# Patient Record
Sex: Male | Born: 1980
Health system: Southern US, Community
[De-identification: ages and names within clinical notes are randomized; demographics above are authoritative.]

## PROBLEM LIST (undated history)

## (undated) DIAGNOSIS — N200 Calculus of kidney: Secondary | ICD-10-CM

## (undated) DIAGNOSIS — T7840XA Allergy, unspecified, initial encounter: Secondary | ICD-10-CM

## (undated) DIAGNOSIS — M19012 Primary osteoarthritis, left shoulder: Secondary | ICD-10-CM

## (undated) DIAGNOSIS — N433 Hydrocele, unspecified: Secondary | ICD-10-CM

## (undated) DIAGNOSIS — J329 Chronic sinusitis, unspecified: Secondary | ICD-10-CM

## (undated) HISTORY — DX: Allergy, unspecified, initial encounter: T78.40XA

## (undated) HISTORY — PX: LITHOTRIPSY: SUR834

## (undated) HISTORY — PX: EYE SURGERY: SHX253

## (undated) HISTORY — DX: Hydrocele, unspecified: N43.3

## (undated) HISTORY — PX: NASAL SINUS SURGERY: SHX719

---

## 1999-01-31 DIAGNOSIS — N433 Hydrocele, unspecified: Secondary | ICD-10-CM

## 1999-01-31 HISTORY — DX: Hydrocele, unspecified: N43.3

## 2003-04-01 ENCOUNTER — Emergency Department (HOSPITAL_COMMUNITY): Admission: EM | Admit: 2003-04-01 | Discharge: 2003-04-01 | Payer: Self-pay | Admitting: Emergency Medicine

## 2005-07-31 ENCOUNTER — Emergency Department (HOSPITAL_COMMUNITY): Admission: EM | Admit: 2005-07-31 | Discharge: 2005-07-31 | Payer: Self-pay | Admitting: *Deleted

## 2005-08-04 ENCOUNTER — Ambulatory Visit (HOSPITAL_COMMUNITY): Admission: RE | Admit: 2005-08-04 | Discharge: 2005-08-04 | Payer: Self-pay | Admitting: Urology

## 2005-08-15 ENCOUNTER — Ambulatory Visit (HOSPITAL_COMMUNITY): Admission: RE | Admit: 2005-08-15 | Discharge: 2005-08-15 | Payer: Self-pay | Admitting: Urology

## 2005-11-07 ENCOUNTER — Emergency Department (HOSPITAL_COMMUNITY): Admission: EM | Admit: 2005-11-07 | Discharge: 2005-11-07 | Payer: Self-pay | Admitting: Emergency Medicine

## 2006-08-04 ENCOUNTER — Emergency Department (HOSPITAL_COMMUNITY): Admission: EM | Admit: 2006-08-04 | Discharge: 2006-08-05 | Payer: Self-pay | Admitting: Emergency Medicine

## 2010-04-06 ENCOUNTER — Emergency Department (HOSPITAL_COMMUNITY): Admission: EM | Admit: 2010-04-06 | Discharge: 2010-04-06 | Payer: Self-pay | Admitting: Family Medicine

## 2011-10-02 ENCOUNTER — Encounter (HOSPITAL_COMMUNITY): Payer: Self-pay | Admitting: Emergency Medicine

## 2011-10-02 ENCOUNTER — Emergency Department (HOSPITAL_COMMUNITY)
Admission: EM | Admit: 2011-10-02 | Discharge: 2011-10-02 | Disposition: A | Discharge status: Home / Self Care | Payer: BC Managed Care – PPO | Attending: Emergency Medicine | Admitting: Emergency Medicine

## 2011-10-02 DIAGNOSIS — R070 Pain in throat: Secondary | ICD-10-CM | POA: Insufficient documentation

## 2011-10-02 DIAGNOSIS — R05 Cough: Secondary | ICD-10-CM | POA: Insufficient documentation

## 2011-10-02 DIAGNOSIS — R059 Cough, unspecified: Secondary | ICD-10-CM

## 2011-10-02 DIAGNOSIS — J3489 Other specified disorders of nose and nasal sinuses: Secondary | ICD-10-CM | POA: Insufficient documentation

## 2011-10-02 DIAGNOSIS — J329 Chronic sinusitis, unspecified: Secondary | ICD-10-CM

## 2011-10-02 DIAGNOSIS — R51 Headache: Secondary | ICD-10-CM | POA: Insufficient documentation

## 2011-10-02 HISTORY — DX: Chronic sinusitis, unspecified: J32.9

## 2011-10-02 MED ORDER — AZITHROMYCIN 250 MG PO TABS: ORAL_TABLET | ORAL | Status: DC

## 2011-10-02 MED ORDER — AZITHROMYCIN 250 MG PO TABS
500.0000 mg | ORAL_TABLET | Freq: Once | ORAL | Status: AC
  Administered 2011-10-02: 500 mg via ORAL
  Filled 2011-10-02: qty 2

## 2011-10-02 MED ORDER — HYDROCODONE-ACETAMINOPHEN 5-325 MG PO TABS
1.0000 | ORAL_TABLET | Freq: Once | ORAL | Status: AC
  Administered 2011-10-02: 1 via ORAL
  Filled 2011-10-02: qty 1

## 2011-10-02 MED ORDER — HYDROCODONE-ACETAMINOPHEN 5-325 MG PO TABS: ORAL_TABLET | ORAL | Status: DC

## 2011-10-02 MED ORDER — IBUPROFEN 800 MG PO TABS
800.0000 mg | ORAL_TABLET | Freq: Once | ORAL | Status: AC
  Administered 2011-10-02: 800 mg via ORAL
  Filled 2011-10-02: qty 1

## 2011-10-02 NOTE — ED Notes (Signed)
Pt c/o cough, vomiting, sore throat, and congestion x 4 days. Pt reports that he has been coughing up green mucus. Pt reports taking mucinex with no relief. Pt reports experiencing chills yesterday but denies fever. Breath sounds clear and equal at this time.

## 2011-10-02 NOTE — ED Notes (Signed)
Patient complaining of cough, vomiting, facial pain, and sore throat x 4 days.

## 2011-10-02 NOTE — ED Notes (Signed)
MD at bedside. 

## 2011-10-02 NOTE — ED Provider Notes (Addendum)
History     CSN: [id number]  Arrival date & time 10/02/11  5621   First MD Initiated Contact with Patient 10/02/11 0340      Chief Complaint  Patient presents with  . Sore Throat  . Cough  . Facial Pain  . Emesis    (Consider location/radiation/quality/duration/timing/severity/associated sxs/prior treatment) HPI Chase Stout is a 31 y.o. male who presents to the Emergency Department complaining of cough x 4 days, nasal congestion with facial pain x 2 days and sore throat x 4 days. Denies fever, chills. Has taken only tylenol with no relief.  PCP Dr. Lubertha South  Past Medical History  Diagnosis Date  . Sinus infection     Past Surgical History  Procedure Date  . Nasal sinus surgery     History reviewed. No pertinent family history.  History  Substance Use Topics  . Smoking status: Never Smoker   . Smokeless tobacco: Not on file  . Alcohol Use: Yes     occasionally      Review of Systems 10 Systems reviewed and are negative for acute change except as noted in the HPI. Allergies  Review of patient's allergies indicates no known allergies.  Home Medications  No current outpatient prescriptions on file.  BP 123/68  Pulse 91  Temp(Src) 98.2 F (36.8 C) (Oral)  Resp 18  Ht 5\' 5"  (1.651 m)  Wt 200 lb (90.719 kg)  BMI 33.28 kg/m2  SpO2 99%  Physical Exam  Nursing note and vitals reviewed. Constitutional: He is oriented to person, place, and time. He appears well-developed and well-nourished. No distress.  HENT:  Head: Normocephalic and atraumatic.  Right Ear: External ear normal.  Left Ear: External ear normal.  Mouth/Throat: Oropharynx is clear and moist.       Maxillary facial pain with percussion. No frontal pain with percussion.  Eyes: EOM are normal. Pupils are equal, round, and reactive to light.  Neck: Normal range of motion. Neck supple.  Cardiovascular: Normal rate, normal heart sounds and intact distal pulses.   Pulmonary/Chest: Effort  normal and breath sounds normal.       coughing  Abdominal: Soft. Bowel sounds are normal.  Musculoskeletal: Normal range of motion.  Neurological: He is alert and oriented to person, place, and time.  Skin: Skin is warm and dry.    ED Course  Procedures (including critical care time) New Prescriptions   AZITHROMYCIN (ZITHROMAX) 250 MG TABLET    1 every day until finished.   HYDROCODONE-ACETAMINOPHEN (NORCO) 5-325 MG PER TABLET    Use one pill every 4-6 hours as needed for cough and pain    MDM  Patient with cough, nasal congestion, facial pain and sore throat. Initiated antibiotic treatment. The patient appears reasonably screened and/or stabilized for discharge and I doubt any other medical condition or other [facility] requiring further screening, evaluation, or treatment in the ED at this time prior to discharge. MDM Reviewed: nursing note and vitals           Nicoletta Dress. Colon Branch, MD 10/02/11 3086  Nicoletta Dress. Colon Branch, MD 10/02/11 [phone]

## 2012-06-18 ENCOUNTER — Ambulatory Visit (INDEPENDENT_AMBULATORY_CARE_PROVIDER_SITE_OTHER): Payer: BC Managed Care – PPO

## 2012-06-18 ENCOUNTER — Ambulatory Visit (INDEPENDENT_AMBULATORY_CARE_PROVIDER_SITE_OTHER): Payer: BC Managed Care – PPO | Admitting: Orthopedic Surgery

## 2012-06-18 ENCOUNTER — Encounter: Payer: Self-pay | Admitting: Orthopedic Surgery

## 2012-06-18 VITALS — BP 90/60 | Ht 65.0 in | Wt 210.0 lb

## 2012-06-18 DIAGNOSIS — M719 Bursopathy, unspecified: Secondary | ICD-10-CM

## 2012-06-18 DIAGNOSIS — M25512 Pain in left shoulder: Secondary | ICD-10-CM | POA: Insufficient documentation

## 2012-06-18 DIAGNOSIS — M25519 Pain in unspecified shoulder: Secondary | ICD-10-CM

## 2012-06-18 DIAGNOSIS — M67919 Unspecified disorder of synovium and tendon, unspecified shoulder: Secondary | ICD-10-CM

## 2012-06-18 DIAGNOSIS — M19012 Primary osteoarthritis, left shoulder: Secondary | ICD-10-CM

## 2012-06-18 DIAGNOSIS — M19019 Primary osteoarthritis, unspecified shoulder: Secondary | ICD-10-CM

## 2012-06-18 HISTORY — DX: Primary osteoarthritis, left shoulder: M19.012

## 2012-06-18 MED ORDER — NABUMETONE 500 MG PO TABS: 500.0000 mg | ORAL_TABLET | Freq: Two times a day (BID) | ORAL | Status: DC

## 2012-06-18 MED ORDER — HYDROCODONE-ACETAMINOPHEN 5-325 MG PO TABS: 1.0000 | ORAL_TABLET | Freq: Four times a day (QID) | ORAL | Status: DC | PRN

## 2012-06-18 NOTE — Patient Instructions (Addendum)
Shoulder arthritis: REFER DR.  LANDAU  Shoulder Joint Replacement Shoulder replacement (arthroplasty) is a procedure that may be recommended if joint disease makes your shoulder stiff and painful, or if the upper arm bone is badly damaged from an accident. The shoulder is a ball-and-socket joint that allows for a wide range of motion. The head of the upper arm bone (humerus) is the ball, and a circular depression (glenoid) in the shoulder bone (scapula) is the socket. A soft-tissue rim (labrum) surrounds and deepens the socket. The head of the upper arm bone is coated with a smooth, durable covering called cartilage, and the joint has a thin, inner lining (synovium) for smooth movement. The surrounding muscles and tendons provide stability and support. IMPLANT DESIGN AND CONSTRUCTION   Shoulder replacement surgery replaces damaged surfaces with artificial parts (prostheses). Usually, there are two parts used to replace this joint.  The humeral component replaces the head of the upper arm bone. It is made of metal (usually cobalt/chromium-based alloys). This is a rounded ball attached to a stem that fits into the humerus bone. This part comes in various sizes and can be a single piece or a modular unit.   The glenoid component replaces the socket (the glenoid depression). It is made of ultrahigh density polyethelene. Some versions have a metal tray, but totally plastic versions are more common.  Depending on the damage to your shoulder, the surgeon may replace just the humeral head (a hemiarthroplasty) or both the humeral head and the glenoid (total shoulder replacement). The shoulder parts come in various sizes and shapes to fit the patient. They are held in place with either bone cement (cemented) or bone ingrowth (cementless).   The surrounding muscles and tendons hold the prosthesis parts in place, the same as the original shoulder. Each case is individual and your surgeon will study your situation  carefully before making any decisions. Ask what type of implant will be used in you and why that choice is appropriate for you. RISKS AND COMPLICATIONS   Complications after shoulder replacement surgery occur less often than with other joint replacement surgeries. However, there are risks. The most common complications are:  Infection.   Upper arm bone fracture that occurs during surgery (intraoperative fracture) or postoperative fractures.   Postoperative instability.   Loosening of the glenoid component over time.  Advances in surgical techniques and prosthetic devices are helping to lessen the chances of complications.   PROCEDURE    Either regional (numb in the shoulder area) or general (sleep during the procedure) anesthesia may be used during shoulder replacement surgery. Your caregiver or anesthesiologist will advise you on the best type of anesthesia for you.   The surgical cut (incision) is 4" to 6" (10cm to 15cm) long and is made on the front of the shoulder from the collarbone (clavicle) to the point where the shoulder muscle (deltoid) attaches to the upper arm bone. The surgeon will take care not to injure the nerves or blood vessels that cross the shoulder.   The upper arm bone is dislocated from the socket to expose the ball-like end of the upper arm. Only the portion of the bone covered by cartilage is removed. Articular cartilage covers the ends of bones where they meet the ends of other bones.   The center cavity of the humerus bone is cleaned and enlarged with reamers to create a hollow area that matches the shape of the implant stem. The top end of the bone is smoothed so  the stem can be inserted flush with the bone surface.   If the ball of the prosthesis is a separate piece, the proper size is selected and attached.   If the socket portion of the joint is basically healthy and the surrounding muscles are intact, the surgeon may decide not to replace it. However, if the  socket is arthritic, the upper arm bone is moved to the back and the surgeon will implant the glenoid component. The surgeon prepares the socket surface by removing the remaining damaged cartilage. The socket bone is then gently reamed to match the implant. Protrusions on the artifical socket part are then fitted into holes drilled in the bone surface. Once the part fits it is cemented into position.   The arm bone, with its new artificial head, is replaced in the socket. The surgeon reattaches the supporting tendons and closes the incision.   The arm is placed in a sling and a support pillow is placed under the elbow to protect the repair.   Tubes are placed to remove excess drainage. These are usually removed a day or two later.  REHABILITATION AFTER SURGERY A rehabilitation program is important to the success of the operation. If the surgery is scheduled for the morning, therapy can begin later that day, and no later than the first day after the procedure. A physical therapist will start gentle range of motion exercises. In these, your arm is gently put through all its motions. Before you leave the hospital (usually two or three days after surgery), your therapist will show you in how to use a pulley device to help bend and extend your arm and will give you directions for other home exercises. HOME CARE INSTRUCTIONS  You may resume normal diet and activities as directed or allowed. Wear the sling every night for at least the first month, or as instructed by your surgeon.   Do not use your arm to push yourself up in bed or from a chair. This requires too much force on the surgically repaired muscles.   Follow the program of home exercises suggested. Do the exercises 4 to 5 times a day for a month or as directed.   Try not to overuse your shoulder. It is easy to do if this is the first time you have been pain free in a long time. Early overuse of the shoulder may result in later problems.   Do  not lift anything heavier than a cup of coffee for the first 6 weeks after surgery.   Ask for help at home. Your caregiver may be able to suggest an agency for this if you do not have home support.   Do not participate in contact sports or do any heavy lifting (more than 10 pounds) for at least 6 months, or as directed.   Keep ice packs (a bag of ice wrapped in a towel) on the surgical area for 15 to 20 minutes, 3 to 4 times per day, for the first two days following surgery.   Change dressings if necessary or as directed. Shower and get the wound wet as directed.   Only take over-the-counter or prescription medicines for pain, discomfort, or fever as directed by your caregiver.   Follow the directions of your surgeon.   Keep appointments as directed.  Document Released: 05/20/2003 Document Revised: 11/13/2011 Document Reviewed: 08/11/2008 [facility] Patient Information 2013 [place], [place].

## 2012-06-18 NOTE — Progress Notes (Signed)
Patient ID: Chase Stout, male   DOB: February 18, 1981, 31 y.o.   MRN: [id number] Chief Complaint  Patient presents with  . 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 He is obviously right-hand-dominant.  He did well for several years even with the limitations of forward elevation only to 90 and only had pain with overuse. However now he does have increased pain pain at night he's waking up and he is having some tingling in the left arm and hand as well. The pain is sharp dull stabbing burning and is rated as a 10 it seems to come and go it is activity related but it is more tense than it was over his lifetime there is catching when he moves his arm. He has a negative review of systems. Medical history is benign  Past Medical History  Diagnosis Date  . Sinus infection     Past Surgical History  Procedure Date  . Nasal sinus surgery   . Lithotripsy     BP 90/60  Ht 5\' 5"  (1.651 m)  Wt 210 lb (95.255 kg)  BMI 34.95 kg/m2 The patient's overall appearance is normal. He is well-groomed. Hygiene normal.  He is oriented x3. Mood and affect are normal. Gait and station are normal.  Inspection there appears to be a slight amount of atrophy in the left upper extremity in the glenohumeral joint area deltoid et Karie Soda. His range of motion is limited to 90 flexion and about 80 of abduction. There is some limitation in external rotation. Instability does not seem to be an issue and there is some weakness in the cuff but he has active forward elevation as stated. Skin shows an incision in the anterior  joint line.  Pulse and temperature are normal there are no lymphadenopathy sensation remains intact no pathologic reflexes.  X-rays show glenohumeral joint changes consistent with arthritis, there is a large inferior osteophyte there is decreased humeral head to acromial joint space.  Impression rotator cuff deficiency Impression glenohumeral arthritis  Recommend consult for evaluation and treatment with Dr. Dion Saucier who shoulder specialist.

## 2012-06-24 ENCOUNTER — Other Ambulatory Visit: Payer: Self-pay | Admitting: *Deleted

## 2012-06-24 ENCOUNTER — Telehealth: Payer: Self-pay | Admitting: *Deleted

## 2012-06-24 DIAGNOSIS — M19012 Primary osteoarthritis, left shoulder: Secondary | ICD-10-CM

## 2012-06-24 NOTE — Telephone Encounter (Signed)
REFERRAL SENT TO DR LANDAU 

## 2012-09-02 ENCOUNTER — Other Ambulatory Visit: Payer: Self-pay | Admitting: Orthopedic Surgery

## 2012-09-02 ENCOUNTER — Ambulatory Visit
Admission: RE | Admit: 2012-09-02 | Discharge: 2012-09-02 | Disposition: A | Discharge status: Home / Self Care | Payer: BC Managed Care – PPO | Source: Ambulatory Visit | Attending: Orthopedic Surgery | Admitting: Orthopedic Surgery

## 2012-09-02 DIAGNOSIS — M1990 Unspecified osteoarthritis, unspecified site: Secondary | ICD-10-CM

## 2012-09-02 DIAGNOSIS — Z01818 Encounter for other preprocedural examination: Secondary | ICD-10-CM

## 2012-09-11 ENCOUNTER — Encounter (HOSPITAL_COMMUNITY): Payer: Self-pay | Admitting: Pharmacy Technician

## 2012-09-16 ENCOUNTER — Encounter (HOSPITAL_COMMUNITY)
Admission: RE | Admit: 2012-09-16 | Discharge: 2012-09-16 | Disposition: A | Discharge status: Home / Self Care | Payer: BC Managed Care – PPO | Source: Ambulatory Visit | Attending: Orthopedic Surgery | Admitting: Orthopedic Surgery

## 2012-09-16 ENCOUNTER — Encounter (HOSPITAL_COMMUNITY): Payer: Self-pay

## 2012-09-16 LAB — BASIC METABOLIC PANEL
BUN: 12 mg/dL (ref 6–23)
CO2: 26 mEq/L (ref 19–32)
Chloride: 103 mEq/L (ref 96–112)
GFR calc Af Amer: >90 mL/min (ref >90)
Glucose, Bld: 81 mg/dL (ref 70–99)
Potassium: 4 mEq/L (ref 3.5–5.1)
Sodium: 139 mEq/L (ref 135–145)

## 2012-09-16 LAB — CBC
HCT: 41.3 % (ref 39.0–52.0)
Hemoglobin: 14.1 g/dL (ref 13.0–17.0)
MCH: 30.3 pg (ref 26.0–34.0)
MCHC: 34.1 g/dL (ref 30.0–36.0)
MCV: 88.6 fL (ref 78.0–100.0)
Platelets: 257 10*3/uL (ref 150–400)
RBC: 4.66 MIL/uL (ref 4.22–5.81)
RDW: 12.2 % (ref 11.5–15.5)
WBC: 9.7 10*3/uL (ref 4.0–10.5)

## 2012-09-16 LAB — TYPE AND SCREEN
ABO/RH(D): A POS
Antibody Screen: NEGATIVE

## 2012-09-16 LAB — SURGICAL PCR SCREEN: Staphylococcus aureus: NEGATIVE

## 2012-09-16 NOTE — Pre-Procedure Instructions (Signed)
Chase Stout  09/16/2012   Your procedure is scheduled on:  Tuesday, 09/24/2012@7 :30AM.  Report to [facility] at 5:30 AM.  Call this number if you have problems the morning of surgery: [phone]   Remember:   Do not eat food or drink liquids after midnight.   Take these medicines the morning of surgery with A SIP OF WATER: Norco(if needed)   Do not wear jewelry, make-up or nail polish.  Do not wear lotions, powders, or perfumes. You may wear deodorant.  Do not shave 48 hours prior to surgery. Men may shave face and neck.  Do not bring valuables to the hospital.  Contacts, dentures or bridgework may not be worn into surgery.  Leave suitcase in the car. After surgery it may be brought to your room.  For patients admitted to the hospital, checkout time is 11:00 AM the day of discharge.   Patients discharged the day of surgery will not be allowed to drive home.  Name and phone number of your driver: Chase Stout, wife.  Special Instructions: Shower using CHG 2 nights before surgery and the night before surgery.  If you shower the day of surgery use CHG.  Use special wash - you have one bottle of CHG for all showers.  You should use approximately 1/3 of the bottle for each shower.   Please read over the following fact sheets that you were given: Pain Booklet, Coughing and Deep Breathing, Blood Transfusion Information and Surgical Site Infection Prevention

## 2012-09-16 NOTE — Progress Notes (Addendum)
Pt here for PAT.  Denies sleep apnea and/or sleep studies. Denies any cardiac issue.  PCP: Dr. Shaune Pollack.

## 2012-09-17 LAB — ABO/RH: ABO/RH(D): A POS

## 2012-09-24 ENCOUNTER — Encounter (HOSPITAL_COMMUNITY): Payer: Self-pay | Admitting: Anesthesiology

## 2012-09-24 ENCOUNTER — Inpatient Hospital Stay (HOSPITAL_COMMUNITY): Payer: BC Managed Care – PPO

## 2012-09-24 ENCOUNTER — Encounter (HOSPITAL_COMMUNITY)
Admission: RE | Disposition: A | Discharge status: Home / Self Care | Payer: Self-pay | Source: Ambulatory Visit | Attending: Orthopedic Surgery

## 2012-09-24 ENCOUNTER — Inpatient Hospital Stay (HOSPITAL_COMMUNITY)
Admission: RE | Admit: 2012-09-24 | Discharge: 2012-09-25 | DRG: 491 | Disposition: A | Discharge status: Home / Self Care | Payer: BC Managed Care – PPO | Source: Ambulatory Visit | Attending: Orthopedic Surgery | Admitting: Orthopedic Surgery

## 2012-09-24 ENCOUNTER — Ambulatory Visit (HOSPITAL_COMMUNITY): Payer: BC Managed Care – PPO | Admitting: Anesthesiology

## 2012-09-24 ENCOUNTER — Encounter (HOSPITAL_COMMUNITY): Payer: Self-pay | Admitting: *Deleted

## 2012-09-24 ENCOUNTER — Encounter (HOSPITAL_COMMUNITY): Payer: Self-pay | Admitting: Orthopedic Surgery

## 2012-09-24 DIAGNOSIS — M19012 Primary osteoarthritis, left shoulder: Secondary | ICD-10-CM

## 2012-09-24 DIAGNOSIS — Z01812 Encounter for preprocedural laboratory examination: Secondary | ICD-10-CM

## 2012-09-24 DIAGNOSIS — Z79899 Other long term (current) drug therapy: Secondary | ICD-10-CM

## 2012-09-24 DIAGNOSIS — M19019 Primary osteoarthritis, unspecified shoulder: Principal | ICD-10-CM | POA: Diagnosis present

## 2012-09-24 HISTORY — PX: SHOULDER HEMI-ARTHROPLASTY: SHX5049

## 2012-09-24 HISTORY — DX: Primary osteoarthritis, left shoulder: M19.012

## 2012-09-24 LAB — CBC
HCT: 38.4 % — ABNORMAL LOW (ref 39.0–52.0)
Hemoglobin: 13.3 g/dL (ref 13.0–17.0)
MCH: 30.4 pg (ref 26.0–34.0)
MCHC: 34.6 g/dL (ref 30.0–36.0)
RDW: 12.1 % (ref 11.5–15.5)

## 2012-09-24 LAB — CREATININE, SERUM: Creatinine, Ser: 0.84 mg/dL (ref 0.50–1.35)

## 2012-09-24 SURGERY — HEMIARTHROPLASTY, SHOULDER
Anesthesia: General | Site: Shoulder | Laterality: Left | Wound class: Clean

## 2012-09-24 MED ORDER — SODIUM CHLORIDE 0.9 % IR SOLN
Status: DC | PRN
  Administered 2012-09-24 (×2): 1000 mL

## 2012-09-24 MED ORDER — ALUM & MAG HYDROXIDE-SIMETH 200-200-20 MG/5ML PO SUSP: 30.0000 mL | ORAL | Status: DC | PRN

## 2012-09-24 MED ORDER — ONDANSETRON HCL 4 MG PO TABS: 4.0000 mg | ORAL_TABLET | Freq: Four times a day (QID) | ORAL | Status: DC | PRN

## 2012-09-24 MED ORDER — BUPIVACAINE-EPINEPHRINE PF 0.5-1:200000 % IJ SOLN
INTRAMUSCULAR | Status: DC | PRN
  Administered 2012-09-24: 150 mg

## 2012-09-24 MED ORDER — ROCURONIUM BROMIDE 100 MG/10ML IV SOLN
INTRAVENOUS | Status: DC | PRN
  Administered 2012-09-24: 50 mg via INTRAVENOUS

## 2012-09-24 MED ORDER — OXYCODONE HCL 5 MG/5ML PO SOLN: 5.0000 mg | Freq: Once | ORAL | Status: DC | PRN

## 2012-09-24 MED ORDER — ENOXAPARIN SODIUM 40 MG/0.4ML ~~LOC~~ SOLN
40.0000 mg | SUBCUTANEOUS | Status: DC
Start: 2012-09-25 — End: 2012-09-25
  Administered 2012-09-25: 40 mg via SUBCUTANEOUS
  Filled 2012-09-24 (×2): qty 0.4

## 2012-09-24 MED ORDER — SENNA 8.6 MG PO TABS
1.0000 | ORAL_TABLET | Freq: Two times a day (BID) | ORAL | Status: DC
  Administered 2012-09-24: 8.6 mg via ORAL
  Filled 2012-09-24 (×3): qty 1

## 2012-09-24 MED ORDER — ACETAMINOPHEN 650 MG RE SUPP: 650.0000 mg | Freq: Four times a day (QID) | RECTAL | Status: DC | PRN

## 2012-09-24 MED ORDER — PHENOL 1.4 % MT LIQD: 1.0000 | OROMUCOSAL | Status: DC | PRN

## 2012-09-24 MED ORDER — POTASSIUM CHLORIDE IN NACL 20-0.45 MEQ/L-% IV SOLN
INTRAVENOUS | Status: DC
  Administered 2012-09-24: 17:00:00 via INTRAVENOUS
  Administered 2012-09-25: 75 mL/h via INTRAVENOUS
  Filled 2012-09-24 (×3): qty 1000

## 2012-09-24 MED ORDER — PHENYLEPHRINE HCL 10 MG/ML IJ SOLN
INTRAMUSCULAR | Status: DC | PRN
  Administered 2012-09-24 (×3): 80 ug via INTRAVENOUS

## 2012-09-24 MED ORDER — DIPHENHYDRAMINE HCL 12.5 MG/5ML PO ELIX: 12.5000 mg | ORAL_SOLUTION | ORAL | Status: DC | PRN

## 2012-09-24 MED ORDER — HYDROMORPHONE HCL PF 1 MG/ML IJ SOLN
0.2500 mg | INTRAMUSCULAR | Status: DC | PRN
  Administered 2012-09-24 (×4): 0.5 mg via INTRAVENOUS

## 2012-09-24 MED ORDER — PROMETHAZINE HCL 25 MG/ML IJ SOLN: 6.2500 mg | INTRAMUSCULAR | Status: DC | PRN

## 2012-09-24 MED ORDER — MENTHOL 3 MG MT LOZG: 1.0000 | LOZENGE | OROMUCOSAL | Status: DC | PRN

## 2012-09-24 MED ORDER — FENTANYL CITRATE 0.05 MG/ML IJ SOLN
INTRAMUSCULAR | Status: DC | PRN
  Administered 2012-09-24: 100 ug via INTRAVENOUS
  Administered 2012-09-24: 50 ug via INTRAVENOUS

## 2012-09-24 MED ORDER — METHOCARBAMOL 500 MG PO TABS
500.0000 mg | ORAL_TABLET | Freq: Four times a day (QID) | ORAL | Status: DC | PRN
  Administered 2012-09-24 – 2012-09-25 (×2): 500 mg via ORAL
  Filled 2012-09-24 (×2): qty 1

## 2012-09-24 MED ORDER — DOCUSATE SODIUM 100 MG PO CAPS
100.0000 mg | ORAL_CAPSULE | Freq: Two times a day (BID) | ORAL | Status: DC
  Administered 2012-09-24: 100 mg via ORAL
  Filled 2012-09-24 (×2): qty 1

## 2012-09-24 MED ORDER — SORBITOL 70 % SOLN: 30.0000 mL | Freq: Every day | Status: DC | PRN

## 2012-09-24 MED ORDER — OXYCODONE-ACETAMINOPHEN 5-325 MG PO TABS: 1.0000 | ORAL_TABLET | ORAL | Status: DC | PRN

## 2012-09-24 MED ORDER — HYDROMORPHONE HCL PF 1 MG/ML IJ SOLN
INTRAMUSCULAR | Status: AC
  Filled 2012-09-24: qty 2

## 2012-09-24 MED ORDER — LACTATED RINGERS IV SOLN
INTRAVENOUS | Status: DC | PRN
  Administered 2012-09-24 (×2): via INTRAVENOUS

## 2012-09-24 MED ORDER — CEFAZOLIN SODIUM 1-5 GM-% IV SOLN
INTRAVENOUS | Status: AC
  Filled 2012-09-24: qty 100

## 2012-09-24 MED ORDER — METHOCARBAMOL 500 MG PO TABS: 500.0000 mg | ORAL_TABLET | Freq: Four times a day (QID) | ORAL | Status: DC

## 2012-09-24 MED ORDER — PROPOFOL 10 MG/ML IV BOLUS
INTRAVENOUS | Status: DC | PRN
  Administered 2012-09-24: 320 mg via INTRAVENOUS

## 2012-09-24 MED ORDER — METHOCARBAMOL 100 MG/ML IJ SOLN
500.0000 mg | Freq: Four times a day (QID) | INTRAVENOUS | Status: DC | PRN
  Filled 2012-09-24: qty 5

## 2012-09-24 MED ORDER — HYDROMORPHONE HCL PF 1 MG/ML IJ SOLN
0.5000 mg | INTRAMUSCULAR | Status: DC | PRN
  Administered 2012-09-24 (×2): 1 mg via INTRAVENOUS
  Filled 2012-09-24 (×2): qty 1

## 2012-09-24 MED ORDER — ZOLPIDEM TARTRATE 5 MG PO TABS: 5.0000 mg | ORAL_TABLET | Freq: Every evening | ORAL | Status: DC | PRN

## 2012-09-24 MED ORDER — ACETAMINOPHEN 325 MG PO TABS: 650.0000 mg | ORAL_TABLET | Freq: Four times a day (QID) | ORAL | Status: DC | PRN

## 2012-09-24 MED ORDER — METOCLOPRAMIDE HCL 10 MG PO TABS: 5.0000 mg | ORAL_TABLET | Freq: Three times a day (TID) | ORAL | Status: DC | PRN

## 2012-09-24 MED ORDER — POLYETHYLENE GLYCOL 3350 17 G PO PACK: 17.0000 g | PACK | Freq: Every day | ORAL | Status: DC | PRN

## 2012-09-24 MED ORDER — ONDANSETRON HCL 4 MG/2ML IJ SOLN
INTRAMUSCULAR | Status: DC | PRN
  Administered 2012-09-24: 4 mg via INTRAVENOUS

## 2012-09-24 MED ORDER — DEXAMETHASONE SODIUM PHOSPHATE 4 MG/ML IJ SOLN
INTRAMUSCULAR | Status: DC | PRN
  Administered 2012-09-24: 10 mg via INTRAVENOUS

## 2012-09-24 MED ORDER — CEFAZOLIN SODIUM-DEXTROSE 2-3 GM-% IV SOLR
2.0000 g | Freq: Four times a day (QID) | INTRAVENOUS | Status: AC
  Administered 2012-09-24 – 2012-09-25 (×3): 2 g via INTRAVENOUS
  Filled 2012-09-24 (×3): qty 50

## 2012-09-24 MED ORDER — OXYCODONE HCL 5 MG PO TABS: 5.0000 mg | ORAL_TABLET | Freq: Once | ORAL | Status: DC | PRN

## 2012-09-24 MED ORDER — LIDOCAINE HCL (CARDIAC) 20 MG/ML IV SOLN
INTRAVENOUS | Status: DC | PRN
  Administered 2012-09-24: 20 mg via INTRAVENOUS

## 2012-09-24 MED ORDER — OXYCODONE HCL 5 MG PO TABS
5.0000 mg | ORAL_TABLET | ORAL | Status: DC | PRN
  Administered 2012-09-25 (×3): 10 mg via ORAL
  Filled 2012-09-24 (×3): qty 2

## 2012-09-24 MED ORDER — ONDANSETRON HCL 4 MG/2ML IJ SOLN
4.0000 mg | Freq: Four times a day (QID) | INTRAMUSCULAR | Status: DC | PRN
  Administered 2012-09-24: 4 mg via INTRAVENOUS
  Filled 2012-09-24: qty 2

## 2012-09-24 MED ORDER — CEFAZOLIN SODIUM 1-5 GM-% IV SOLN
1.0000 g | Freq: Once | INTRAVENOUS | Status: AC
  Administered 2012-09-24: 2 g via INTRAVENOUS
  Filled 2012-09-24: qty 50

## 2012-09-24 MED ORDER — METOCLOPRAMIDE HCL 5 MG/ML IJ SOLN: 5.0000 mg | Freq: Three times a day (TID) | INTRAMUSCULAR | Status: DC | PRN

## 2012-09-24 MED ORDER — MIDAZOLAM HCL 5 MG/5ML IJ SOLN
INTRAMUSCULAR | Status: DC | PRN
  Administered 2012-09-24: 2 mg via INTRAVENOUS

## 2012-09-24 MED ORDER — PROMETHAZINE HCL 25 MG PO TABS: 25.0000 mg | ORAL_TABLET | Freq: Four times a day (QID) | ORAL | Status: DC | PRN

## 2012-09-24 MED ORDER — SENNA-DOCUSATE SODIUM 8.6-50 MG PO TABS: 1.0000 | ORAL_TABLET | Freq: Every day | ORAL | Status: DC

## 2012-09-24 MED ORDER — OXYCODONE-ACETAMINOPHEN 10-325 MG PO TABS: 1.0000 | ORAL_TABLET | Freq: Four times a day (QID) | ORAL | Status: DC | PRN

## 2012-09-24 SURGICAL SUPPLY — 66 items
APL SKNCLS STERI-STRIP NONHPOA (GAUZE/BANDAGES/DRESSINGS) ×1
BENZOIN TINCTURE PRP APPL 2/3 (GAUZE/BANDAGES/DRESSINGS) ×2 IMPLANT
BLADE SAW SAG 29X58X.64 (BLADE) ×2 IMPLANT
BOOTCOVER CLEANROOM LRG (PROTECTIVE WEAR) ×4 IMPLANT
BOWL SMART MIX CTS (DISPOSABLE) IMPLANT
BRUSH FEMORAL CANAL (MISCELLANEOUS) IMPLANT
CLOTH BEACON ORANGE TIMEOUT ST (SAFETY) ×2 IMPLANT
COVER SURGICAL LIGHT HANDLE (MISCELLANEOUS) ×2 IMPLANT
COVER TABLE BACK 60X90 (DRAPES) IMPLANT
DRAPE C-ARM 42X72 X-RAY (DRAPES) IMPLANT
DRAPE INCISE IOBAN 66X45 STRL (DRAPES) ×2 IMPLANT
DRAPE U-SHAPE 47X51 STRL (DRAPES) ×2 IMPLANT
DRSG MEPILEX BORDER 4X8 (GAUZE/BANDAGES/DRESSINGS) ×1 IMPLANT
DRSG PAD ABDOMINAL 8X10 ST (GAUZE/BANDAGES/DRESSINGS) ×2 IMPLANT
DURAPREP 26ML APPLICATOR (WOUND CARE) ×2 IMPLANT
ELECT BLADE 6.5 EXT (BLADE) IMPLANT
ELECT NDL TIP 2.8 STRL (NEEDLE) ×1 IMPLANT
ELECT NEEDLE TIP 2.8 STRL (NEEDLE) ×2 IMPLANT
ELECT REM PT RETURN 9FT ADLT (ELECTROSURGICAL) ×2
ELECTRODE REM PT RTRN 9FT ADLT (ELECTROSURGICAL) ×1 IMPLANT
EVACUATOR 1/8 PVC DRAIN (DRAIN) IMPLANT
FACESHIELD LNG OPTICON STERILE (SAFETY) IMPLANT
GLOVE BIOGEL PI IND STRL 8 (GLOVE) ×2 IMPLANT
GLOVE BIOGEL PI INDICATOR 8 (GLOVE) ×2
GLOVE ORTHO TXT STRL SZ7.5 (GLOVE) ×2 IMPLANT
GLOVE SURG ORTHO 8.0 STRL STRW (GLOVE) ×4 IMPLANT
GOWN PREVENTION PLUS XXLARGE (GOWN DISPOSABLE) ×2 IMPLANT
GOWN STRL REIN XL XLG (GOWN DISPOSABLE) IMPLANT
HANDPIECE INTERPULSE COAX TIP (DISPOSABLE)
HOOD PEEL AWAY FACE SHEILD DIS (HOOD) ×4 IMPLANT
KIT BASIN OR (CUSTOM PROCEDURE TRAY) ×2 IMPLANT
KIT ROOM TURNOVER OR (KITS) ×2 IMPLANT
MANIFOLD NEPTUNE II (INSTRUMENTS) ×2 IMPLANT
NDL 1/2 CIR CATGUT .05X1.09 (NEEDLE) ×1 IMPLANT
NDL HYPO 25GX1X1/2 BEV (NEEDLE) ×1 IMPLANT
NEEDLE 1/2 CIR CATGUT .05X1.09 (NEEDLE) ×2 IMPLANT
NEEDLE HYPO 25GX1X1/2 BEV (NEEDLE) ×2 IMPLANT
NS IRRIG 1000ML POUR BTL (IV SOLUTION) ×2 IMPLANT
PACK SHOULDER (CUSTOM PROCEDURE TRAY) ×2 IMPLANT
PAD ARMBOARD 7.5X6 YLW CONV (MISCELLANEOUS) ×4 IMPLANT
PIN STEINMANN THREADED TIP (PIN) ×1 IMPLANT
RETRIEVER SUT HEWSON (MISCELLANEOUS) IMPLANT
SET HNDPC FAN SPRY TIP SCT (DISPOSABLE) IMPLANT
SLING ARM IMMOBILIZER LRG (SOFTGOODS) IMPLANT
SLING ARM IMMOBILIZER MED (SOFTGOODS) IMPLANT
SMARTMIX MINI TOWER (MISCELLANEOUS)
SPONGE GAUZE 4X4 12PLY (GAUZE/BANDAGES/DRESSINGS) ×2 IMPLANT
SPONGE LAP 18X18 X RAY DECT (DISPOSABLE) ×2 IMPLANT
STRIP CLOSURE SKIN 1/2X4 (GAUZE/BANDAGES/DRESSINGS) ×2 IMPLANT
SUCTION FRAZIER TIP 10 FR DISP (SUCTIONS) ×2 IMPLANT
SUPPORT WRAP ARM LG (MISCELLANEOUS) ×2 IMPLANT
SUT FIBERWIRE #2 38 REV NDL BL (SUTURE) ×12
SUT MNCRL AB 4-0 PS2 18 (SUTURE) ×2 IMPLANT
SUT VIC AB 0 CT1 27 (SUTURE) ×2
SUT VIC AB 0 CT1 27XBRD ANBCTR (SUTURE) ×1 IMPLANT
SUT VIC AB 2-0 CT1 27 (SUTURE)
SUT VIC AB 2-0 CT1 TAPERPNT 27 (SUTURE) IMPLANT
SUT VIC AB 3-0 SH 18 (SUTURE) ×2 IMPLANT
SUTURE FIBERWR#2 38 REV NDL BL (SUTURE) ×3 IMPLANT
SYR CONTROL 10ML LL (SYRINGE) ×2 IMPLANT
TAPE STRIPS DRAPE STRL (GAUZE/BANDAGES/DRESSINGS) ×1 IMPLANT
TOWEL OR 17X24 6PK STRL BLUE (TOWEL DISPOSABLE) ×2 IMPLANT
TOWEL OR 17X26 10 PK STRL BLUE (TOWEL DISPOSABLE) ×2 IMPLANT
TOWER SMARTMIX MINI (MISCELLANEOUS) IMPLANT
TRAY FOLEY CATH 14FR (SET/KITS/TRAYS/PACK) IMPLANT
WATER STERILE IRR 1000ML POUR (IV SOLUTION) ×2 IMPLANT

## 2012-09-24 NOTE — Anesthesia Procedure Notes (Addendum)
Anesthesia Regional Block:  Interscalene brachial plexus block  Pre-Anesthetic Checklist: ,, timeout performed, Correct Patient, Correct Site, Correct Laterality, Correct Procedure, Correct Position, site marked, Risks and benefits discussed,  Surgical consent,  Pre-op evaluation,  At surgeon's request and post-op pain management  Laterality: Left  Prep: chloraprep       Needles:  Injection technique: Single-shot  Needle Type: Echogenic Stimulator Needle     Needle Length: 5cm 5 cm Needle Gauge: 22 and 22 G    Additional Needles:  Procedures: ultrasound guided (picture in chart) and nerve stimulator Interscalene brachial plexus block  Nerve Stimulator or Paresthesia:  Response: bicep contraction, 0.45 mA,   Additional Responses:   Narrative:  Start time: 09/24/2012 7:04 AM End time: 09/24/2012 7:14 AM Injection made incrementally with aspirations every 5 mL.  Performed by: Personally  Anesthesiologist: J. Adonis Huguenin, MD  Additional Notes: Functioning IV was confirmed and monitors applied.  A 50mm 22ga echogenic arrow stimulator was used. Sterile prep and drape,hand hygiene and sterile gloves were used.Ultrasound guidance: relevant anatomy identified, needle position confirmed, local anesthetic spread visualized around nerve(s)., vascular puncture avoided.  Image printed for medical record.  Negative aspiration and negative test dose prior to incremental administration of local anesthetic. The patient tolerated the procedure well.  Interscalene brachial plexus block Procedure Name: Intubation Date/Time: 09/24/2012 7:50 AM Performed by: Jerilee Hoh Pre-anesthesia Checklist: Patient identified, Emergency Drugs available, Suction available and Patient being monitored Patient Re-evaluated:Patient Re-evaluated prior to inductionOxygen Delivery Method: Circle system utilized Preoxygenation: Pre-oxygenation with 100% oxygen Intubation Type: IV induction Ventilation: Mask  ventilation without difficulty Laryngoscope Size: Mac and 4 Grade View: Grade II Tube type: Oral Tube size: 7.5 mm Number of attempts: 1 Airway Equipment and Method: Stylet Placement Confirmation: ETT inserted through vocal cords under direct vision,  positive ETCO2 and breath sounds checked- equal and bilateral Secured at: 23 cm Tube secured with: Tape Dental Injury: Teeth and Oropharynx as per pre-operative assessment

## 2012-09-24 NOTE — Op Note (Signed)
09/24/2012  10:32 AM  PATIENT:  Chase Stout    PRE-OPERATIVE DIAGNOSIS:  Degenerative joint disease left shoulder, remote history of neonatal sepsis   POST-OPERATIVE DIAGNOSIS:  Same  PROCEDURE:  Left SHOULDER HEMI-ARTHROPLASTY  SURGEON:  Eulas Post, MD  PHYSICIAN ASSISTANT: Janace Litten, OPA-C, present and scrubbed throughout the case, critical for completion in a timely fashion, and for retraction, instrumentation, and closure.  ANESTHESIA:   General  PREOPERATIVE INDICATIONS:  Chase Stout is a  32 y.o. male with a diagnosis of Degenerative joint disease left shoulder  who failed conservative measures and elected for surgical management.  He works as a Emergency planning/management officer, and had neonatal sepsis, and subsequent developmental deformity and complete dysfunction of the left shoulder.  The risks benefits and alternatives were discussed with the patient preoperatively including but not limited to the risks of infection, bleeding, nerve injury, cardiopulmonary complications, the need for revision surgery, dislocation, loosening, incomplete relief of pain, among others, and the patient was willing to proceed.   OPERATIVE IMPLANTS: Biomet size 9 mini press-fit humeral stem, size 42+18 Versa-dial humeral head, set in the E position with increased coverage anteriorly.  OPERATIVE FINDINGS: He had severe restriction in motion, and examination under anesthesia demonstrated an internal flexion contracture lacking at least 30. Intraoperative he had a very stout adhesive band from the CA ligament to the humeral head preventing external rotation. His anatomy was grossly deformed, and his rotator cuff did not attach on the tuberosity laterally, but actually more medially, and the tuberosity basically home over the top of the head. His glenoid had a slightly biconcave appearance, with the posterior aspect being very smooth, while the anterior aspect being slightly roughened, although it wasn't  really arthritis, it was more like an abnormal film tissue over the anterior glenoid, I suspect where he was not articulating permanently.   OPERATIVE PROCEDURE: The patient was brought to the operating room and placed in the supine position. General anesthesia was administered. IV antibiotics were given.  A foley was placed. The upper extremity was prepped and draped in usual sterile fashion. The patient was in a beachchair position with all bony prominences padded.   Time out was performed and a deltopectoral approach was carried out. The biceps tendon was tenodesed to the pectoralis tendon. The subscapularis was released, tagging it with a #2 FiberWire, releasing it off the bone, trying to preserve as much length as possible. He had an internal flexion contracture, that I was very concerned about the subscapularis repair, so I didn't take it directly off of the bone in order to maximize length.   The inferior osteophyte was removed, and release of the capsule off of the humeral side was completed. This was extremely challenging, as achieving external rotation was very difficult. I protected the axillary nerve and mobilize the inferior capsule, and then released it around posteriorly. An osteotome was used to remove the osteophyte inferiorly. The head was dislocated, and I exposed the rotator cuff. The rotator cuff was all intact, but the head was grossly deformed. I had to free hand cut the head, due to the abnormal anatomy. A standard jig would not apply.  I took care to protect the rotator cuff, and preserved its insertion superiorly and posteriorly. After making my cut I removed the humeral head and I turned my attention to the glenoid.  Deep retractors were placed, and the remaining stump of the biceps was resected off of the superior labrum. 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   I then went back to the humeral side, and I reamed sequentially, and then I sequentially broached, up to the selected size, with the broach set at 30 of retroversion. I placed a total of 4 #2 FiberWire through holes in the bone, with the inferior and superior most being simple sutures, and then 2 horizontal mattress sutures placed through the bone tunnels. I then placed the real stem. I trialed with multiple heads, and the above-named component was selected. Increased posterior coverage improved the coverage. The soft tissue tension was appropriate.   I then impacted the real humeral head into place, reduced the head, and irrigated copiously. Excellent stability and satisfactory range of motion was achieved. I repaired the subscapularis with 4 #2 FiberWire, the inferior and superior most sutures were simple, and the 2 center sutures were horizontal mattress, and then I also did a double row component, bringing them out through 2 more holes placed through the intertubercular groove. This applied compression to the flap of the subscapularis. He was then able to reach neutral rotation quite easily, and I was satisfied with the subscapularis repair. I irrigated copiously once more. The subcutaneous tissue was closed with Vicryl including the deltopectoral fascia.   The skin was closed with Steri-Strips and sterile gauze was applied. He had a preoperative nerve block. He tolerated the procedure well and there were no complications.

## 2012-09-24 NOTE — Anesthesia Preprocedure Evaluation (Addendum)
Anesthesia Evaluation  Patient identified by MRN, date of birth, ID band Patient awake    Reviewed: Allergy & Precautions, NPO status , Patient's Chart, lab work & pertinent test results  History of Anesthesia Complications Negative for: history of anesthetic complications  Airway Mallampati: II TM Distance: >3 FB Neck ROM: Full    Dental  (+) Teeth Intact and Dental Advisory Given   Pulmonary neg pulmonary ROS,    Pulmonary exam normal       Cardiovascular negative cardio ROS      Neuro/Psych negative neurological ROS     GI/Hepatic negative GI ROS, Neg liver ROS,   Endo/Other  negative endocrine ROS  Renal/GU negative Renal ROS     Musculoskeletal   Abdominal   Peds  Hematology negative hematology ROS (+)   Anesthesia Other Findings   Reproductive/Obstetrics                          Anesthesia Physical Anesthesia Plan  ASA: II  Anesthesia Plan: General   Post-op Pain Management:    Induction: Intravenous  Airway Management Planned: Oral ETT  Additional Equipment:   Intra-op Plan:   Post-operative Plan: Extubation in OR  Informed Consent: I have reviewed the patients History and Physical, chart, labs and discussed the procedure including the risks, benefits and alternatives for the proposed anesthesia with the patient or authorized representative who has indicated his/her understanding and acceptance.   Dental advisory given  Plan Discussed with: CRNA, Anesthesiologist and Surgeon  Anesthesia Plan Comments:         Anesthesia Quick Evaluation

## 2012-09-24 NOTE — Anesthesia Postprocedure Evaluation (Signed)
Anesthesia Post Note  Patient: Chase Stout  Procedure(s) Performed: Procedure(s) (LRB): SHOULDER HEMI-ARTHROPLASTY (Left)  Anesthesia type: general  Patient location: PACU  Post pain: Pain level controlled  Post assessment: Patient's Cardiovascular Status Stable  Last Vitals:  Filed Vitals:   09/24/12 1200  BP:   Pulse: 78  Temp:   Resp: 17    Post vital signs: Reviewed and stable  Level of consciousness: sedated  Complications: No apparent anesthesia complications

## 2012-09-24 NOTE — Transfer of Care (Signed)
Immediate Anesthesia Transfer of Care Note  Patient: Chase Stout  Procedure(s) Performed: Procedure(s) (LRB) with comments: SHOULDER HEMI-ARTHROPLASTY (Left)  Patient Location: PACU  Anesthesia Type:GA combined with regional for post-op pain  Level of Consciousness: awake, alert , oriented and patient cooperative  Airway & Oxygen Therapy: Patient Spontanous Breathing and Patient connected to nasal cannula oxygen  Post-op Assessment: Report given to PACU RN, Post -op Vital signs reviewed and stable and Patient moving all extremities  Post vital signs: Reviewed and stable  Complications: No apparent anesthesia complications

## 2012-09-24 NOTE — H&P (Signed)
PREOPERATIVE H&P  Chief Complaint: djd left shoulder   HPI: Chase Stout is a 32 y.o. male who presents for preoperative history and physical with a diagnosis of djd left shoulder . Symptoms are rated as moderate to severe, and have been worsening.  This is significantly impairing activities of daily living.  He has elected for surgical management. This began after an episode of neonatal sepsis when he was a baby, and has had permanent dysfunction ever since.  Past Medical History  Diagnosis Date  . Sinus infection    Past Surgical History  Procedure Date  . Nasal sinus surgery   . Lithotripsy    History   Social History  . Marital Status: Married    Spouse Name: N/A    Number of Children: N/A  . Years of Education: 14   Occupational History  .     Social History Main Topics  . Smoking status: Never Smoker   . Smokeless tobacco: None  . Alcohol Use: 1.2 oz/week    2 Cans of beer per week     Comment: occasionally  . Drug Use: No  . Sexually Active:    Other Topics Concern  . None   Social History Narrative  . None   History reviewed. No pertinent family history. No Known Allergies Prior to Admission medications   Medication Sig Start Date End Date Taking? Authorizing Provider  HYDROcodone-acetaminophen (NORCO/VICODIN) 5-325 MG per tablet Take 1 tablet by mouth every 6 (six) hours as needed for pain. 06/18/12  Yes Vickki Hearing, MD     Positive ROS: All other systems have been reviewed and were otherwise negative with the exception of those mentioned in the HPI and as above.  Physical Exam: General: Alert, no acute distress Cardiovascular: No pedal edema Respiratory: No cyanosis, no use of accessory musculature GI: No organomegaly, abdomen is soft and non-tender Skin: No lesions in the area of chief complaint Neurologic: Sensation intact distally Psychiatric: Patient is competent for consent with normal mood and affect Lymphatic: No axillary or  cervical lymphadenopathy  MUSCULOSKELETAL: left shoulder has very limited active motion, 0-30 at most. All fingers flex extend and abduct.  Assessment: djd left shoulder with dysfunction secondary to neonatal sepsis and alternative element.  Plan: Plan for Procedure(s): TOTAL SHOULDER ARTHROPLASTY  The risks benefits and alternatives were discussed with the patient including but not limited to the risks of nonoperative treatment, versus surgical intervention including infection, bleeding, nerve injury,  blood clots, cardiopulmonary complications, morbidity, mortality, among others, and they were willing to proceed.   Ladale Sherburn P, MD Cell [phone] Pager [phone]  09/24/2012 7:34 AM

## 2012-09-25 LAB — CBC
Hemoglobin: 12.3 g/dL — ABNORMAL LOW (ref 13.0–17.0)
MCH: 30.4 pg (ref 26.0–34.0)
MCV: 89.4 fL (ref 78.0–100.0)
RBC: 4.04 MIL/uL — ABNORMAL LOW (ref 4.22–5.81)

## 2012-09-25 LAB — BASIC METABOLIC PANEL
CO2: 27 mEq/L (ref 19–32)
Calcium: 8.7 mg/dL (ref 8.4–10.5)
Creatinine, Ser: 0.83 mg/dL (ref 0.50–1.35)
Glucose, Bld: 95 mg/dL (ref 70–99)

## 2012-09-25 NOTE — Progress Notes (Signed)
Patient ID: Chase Stout, male   DOB: [birth year], 32 y.o.   MRN: [id number]     Subjective:  Patient reports pain as mild to moderate.  States that he is doing well and denies CP or SOB.  Objective:   VITALS:   Filed Vitals:   09/24/12 1700 09/24/12 2105 09/25/12 0200 09/25/12 0600  BP: 103/79 106/52 110/59 104/59  Pulse: 110 104 78 74  Temp: 98.6 F (37 C) 98.5 F (36.9 C) 98.2 F (36.8 C) 98.3 F (36.8 C)  TempSrc: Oral     Resp: 14 16 18 18   SpO2: 97% 99% 99% 99%    ABD soft Sensation intact distally Dorsiflexion/Plantar flexion intact Incision: dressing C/D/I and scant drainage   Lab Results  Component Value Date   WBC 14.8* 09/25/2012   HGB 12.3* 09/25/2012   HCT 36.1* 09/25/2012   MCV 89.4 09/25/2012   PLT 229 09/25/2012     Assessment/Plan: 1 Day Post-Op   Principal Problem:  *Osteoarthritis of left shoulder   Advance diet Up with therapy Discharge home with home health if passed PT/OT NWB left upper ext. Sling at all times   Haskel Khan 09/25/2012, 9:31 AM   Teryl Lucy, MD Cell [phone] Pager [phone]

## 2012-09-25 NOTE — Evaluation (Signed)
Occupational Therapy Evaluation and Discharge Summary Patient Details Name: Chase Stout MRN: [id number] DOB: [birth year] Today's Date: 09/25/2012 Time: 0850-0910 OT Time Calculation (min): 20 min  OT Assessment / Plan / Recommendation Clinical Impression  Pt admitted for L total shoulder and has been educated on all necessary information for exercises and adls.  Pt demonstrated understanding of all adl techniques, all elbow, wrist and hand exercises and all precautions.  Pt is not in need of further OT at this time.    OT Assessment  Progress rehab of shoulder as ordered by MD at follow-up appointment    Follow Up Recommendations  Outpatient OT;Other (comment) (when ordered by ortho MD)    Barriers to Discharge      Equipment Recommendations  None recommended by OT    Recommendations for Other Services    Frequency       Precautions / Restrictions Precautions Precautions: Shoulder Type of Shoulder Precautions: No shoulder movement at all.  NWB LUE.  Sling at all times except when bathing/dressing. Precaution Comments: Pt understands all precautions. Required Braces or Orthoses: Other Brace/Splint Other Brace/Splint: sling at all times except when bathing and dressing Restrictions Weight Bearing Restrictions: Yes LUE Weight Bearing: Non weight bearing   Pertinent Vitals/Pain Pt c/o 5/10 pain. Nursing brought in pain meds during treatment.  All other vitals stable.    ADL  Eating/Feeding: Performed;Independent Where Assessed - Eating/Feeding: Chair Grooming: Performed;Modified independent Where Assessed - Grooming: Unsupported standing Upper Body Bathing: Simulated;Minimal assistance Where Assessed - Upper Body Bathing: Unsupported sitting Lower Body Bathing: Simulated;Modified independent Where Assessed - Lower Body Bathing: Unsupported sit to stand Upper Body Dressing: Performed;Moderate assistance Where Assessed - Upper Body Dressing: Unsupported sitting Lower Body  Dressing: Simulated;Modified independent Where Assessed - Lower Body Dressing: Supported sit to Pharmacist, hospital: Performed;Independent Toilet Transfer Method: Sit to Barista: Comfort height toilet;Grab bars Toileting - Architect and Hygiene: Performed;Independent Where Assessed - Toileting Clothing Manipulation and Hygiene: Standing Transfers/Ambulation Related to ADLs: Pt independent with all transfers and ambulation in the room. ADL Comments: assist only needed with dressing.  Pt donned sling with VCs.    OT Diagnosis:    OT Problem List:   OT Treatment Interventions:     OT Goals    Visit Information  Last OT Received On: 09/25/12 Assistance Needed: +1    Subjective Data  Subjective: "I feel okay up on my feet." Patient Stated Goal: to go home.   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 Dominant Hand: Right         Vision/Perception Vision - Assessment Eye Alignment: Within Functional Limits Vision Assessment: Vision not tested   Cognition  Overall Cognitive Status: Appears within functional limits for tasks assessed/performed Arousal/Alertness: Awake/alert Orientation Level: Oriented X4 / Intact Behavior During Session: WFL for tasks performed Cognition - Other Comments: intact    Extremity/Trunk Assessment Right Upper Extremity Assessment RUE ROM/Strength/Tone: Within functional levels RUE Sensation: WFL - Light  Touch RUE Coordination: WFL - gross/fine motor Left Upper Extremity Assessment LUE ROM/Strength/Tone: Unable to fully assess;Due to precautions LUE  Sensation: Deficits LUE Sensation Deficits: pt reports L thumb is still a bit numb possibly from the nerve block done for surgery. LUE Coordination: WFL - gross/fine motor Trunk Assessment Trunk Assessment: Normal     Mobility Bed Mobility Bed Mobility: Supine to Sit;Sitting - Scoot to Edge of Bed Supine to Sit: 7: Independent Sitting - Scoot to Edge of Bed: 7: Independent Details for Bed Mobility Assistance: independent with all bed mobility. Transfers Transfers: Sit to Stand;Stand to Sit Sit to Stand: 7: Independent Stand to Sit: 7: Independent Details for Transfer Assistance: no assist necessary     Shoulder Instructions Donning/doffing shirt without moving shoulder: Minimal assistance Method for sponge bathing under operated UE: Independent Donning/doffing sling/immobilizer: Supervision/safety Correct positioning of sling/immobilizer: Independent ROM for elbow, wrist and digits of operated UE: Independent Sling wearing schedule (on at all times/off for ADL's): Independent Proper positioning of operated UE when showering: Independent Positioning of UE while sleeping: Independent   Exercise Shoulder Exercises Elbow Flexion: AROM;10 reps Elbow Extension: AROM;10 reps Wrist Flexion: AROM;10 reps Wrist Extension: AROM;10 reps Digit Composite Flexion: AROM;10 reps Composite Extension: AROM;10 reps   Balance Balance Balance Assessed: No   End of Session OT - End of Session Activity Tolerance: Patient tolerated treatment well Patient left: in bed;with call bell/phone within reach;with family/visitor present Nurse Communication: Mobility status  GO     Chase Stout 09/25/2012, 9:23 AM [phone]

## 2012-09-25 NOTE — Progress Notes (Signed)
UR COMPLETED  

## 2012-09-25 NOTE — Discharge Summary (Signed)
Physician Discharge Summary  Patient ID: Chase Stout MRN: [id number] DOB/AGE: 34/15/1982 34 y.o.  Admit date: 09/24/2012 Discharge date: 09/25/2012  Admission Diagnoses:  Osteoarthritis of left shoulder  Discharge Diagnoses:  Principal Problem:  *Osteoarthritis of left shoulder   Past Medical History  Diagnosis Date  . Sinus infection   . Osteoarthritis of left shoulder 06/18/2012    Surgeries: Procedure(s): SHOULDER HEMI-ARTHROPLASTY on 09/24/2012   Consultants (if any):    Discharged Condition: Improved  Hospital Course: Chase Stout is an 32 y.o. male who was admitted 09/24/2012 with a diagnosis of Osteoarthritis of left shoulder and went to the operating room on 09/24/2012 and underwent the above named procedures.    He was given perioperative antibiotics:  Anti-infectives     Start     Dose/Rate Route Frequency Ordered Stop   09/24/12 1630   ceFAZolin (ANCEF) IVPB 2 g/50 mL premix        2 g 100 mL/hr over 30 Minutes Intravenous Every 6 hours 09/24/12 1616 09/25/12 0357   09/24/12 0830   ceFAZolin (ANCEF) IVPB 1 g/50 mL premix        1 g 100 mL/hr over 30 Minutes Intravenous  Once 09/24/12 0818 09/24/12 0759        .  He was given sequential compression devices, early ambulation, and lovenox for DVT prophylaxis.  He benefited maximally from the hospital stay and there were no complications.    Recent vital signs:  Filed Vitals:   09/25/12 1300  BP: 107/51  Pulse: 97  Temp: 98.2 F (36.8 C)  Resp: 18    Recent laboratory studies:  Lab Results  Component Value Date   HGB 12.3* 09/25/2012   HGB 13.3 09/24/2012   HGB 14.1 09/16/2012   Lab Results  Component Value Date   WBC 14.8* 09/25/2012   PLT 229 09/25/2012   No results found for this basename: INR   Lab Results  Component Value Date   NA 138 09/25/2012   K 3.9 09/25/2012   CL 103 09/25/2012   CO2 27 09/25/2012   BUN 10 09/25/2012   CREATININE 0.83 09/25/2012   GLUCOSE 95 09/25/2012     Discharge Medications:     Medication List     As of 09/25/2012  2:13 PM    STOP taking these medications         HYDROcodone-acetaminophen 5-325 MG per tablet   Commonly known as: NORCO/VICODIN      TAKE these medications         methocarbamol 500 MG tablet   Commonly known as: ROBAXIN   Take 1 tablet (500 mg total) by mouth 4 (four) times daily.      oxyCODONE-acetaminophen 10-325 MG per tablet   Commonly known as: PERCOCET   Take 1-2 tablets by mouth every 6 (six) hours as needed for pain. MAXIMUM TOTAL ACETAMINOPHEN DOSE IS 4000 MG PER DAY      promethazine 25 MG tablet   Commonly known as: PHENERGAN   Take 1 tablet (25 mg total) by mouth every 6 (six) hours as needed for nausea.      sennosides-docusate sodium 8.6-50 MG tablet   Commonly known as: SENOKOT-S   Take 1 tablet by mouth daily.        Diagnostic Studies: Ct Shoulder Left Wo Contrast  09/02/2012  *RADIOLOGY REPORT*  Clinical Data: Preop for left shoulder arthroplasty.  CT OF THE LEFT SHOULDER WITHOUT CONTRAST  Technique:  Multidetector CT imaging was performed  according to the standard protocol. Multiplanar CT image reconstructions were also generated.  Comparison: Radiographs 06/18/2012.  Findings: There are glenohumeral joint degenerative changes with a significant deformity of the humeral head and significant spurring changes.  Cannot exclude the possibility of previous humeral head trauma/fracture.  The deformity of the humeral head is out of proportion to the degree of glenohumeral joint degenerative disease.  The [facility] joint is intact.  The humeral acromial space is maintained. The scapula and left upper ribs are intact.  No lung lesions are seen.  IMPRESSION:  1.  Marked deformity of the humeral head out of portion to the degree of glenohumeral joint degenerative disease.  This is likely post-traumatic change. 2.  Moderate glenohumeral joint degenerative change. 3.  No other significant findings.   Original  Report Authenticated By: Rudie Meyer, M.D.    Dg Shoulder Left Port  09/24/2012  *RADIOLOGY REPORT*  Clinical Data: Postop shoulder replacement  PORTABLE LEFT SHOULDER - 2+ VIEW  Comparison: 06/18/2012  Findings: Left shoulder hemiarthroplasty in satisfactory position alignment.  No fracture or acute complication.  IMPRESSION: Satisfactory left shoulder hemiarthroplasty.   Original Report Authenticated By: Janeece Riggers, M.D.     Disposition: 01-Home or Self Care      Discharge Orders    Future Orders Please Complete By Expires   Diet general      Call MD / Call 911      Comments:   If you experience chest pain or shortness of breath, CALL 911 and be transported to the hospital emergency room.  If you develope a fever above 101 F, pus (white drainage) or increased drainage or redness at the wound, or calf pain, call your surgeon's office.   Discharge instructions      Comments:   Change dressing in 3 days and reapply fresh dressing, unless you have a splint (half cast).  If you have a splint/cast, just leave in place until your follow-up appointment.    Keep wounds dry for 3 weeks.  Leave steri-strips in place on skin.  Do not apply lotion or anything to the wound.   Constipation Prevention      Comments:   Drink plenty of fluids.  Prune juice may be helpful.  You may use a stool softener, such as Colace (over the counter) 100 mg twice a day.  Use MiraLax (over the counter) for constipation as needed.   OT splint   09/24/13   Comments:   Ok for elbow, wrist, and hand motion.  Sling at all times except for hygiene, no shoulder activity until 6 weeks.      Follow-up Information    Follow up with Eulas Post, MD. Schedule an appointment as soon as possible for a visit in 2 days.   Contact information:   [address] [place] [address] [phone]           Signed: Eulas Post 09/25/2012, 2:13 PM

## 2012-09-27 ENCOUNTER — Encounter (HOSPITAL_COMMUNITY): Payer: Self-pay | Admitting: Orthopedic Surgery

## 2012-11-11 ENCOUNTER — Ambulatory Visit: Payer: BC Managed Care – PPO | Attending: Orthopedic Surgery | Admitting: Physical Therapy

## 2012-11-11 DIAGNOSIS — R5381 Other malaise: Secondary | ICD-10-CM | POA: Insufficient documentation

## 2012-11-11 DIAGNOSIS — M25619 Stiffness of unspecified shoulder, not elsewhere classified: Secondary | ICD-10-CM | POA: Insufficient documentation

## 2012-11-11 DIAGNOSIS — M25519 Pain in unspecified shoulder: Secondary | ICD-10-CM | POA: Insufficient documentation

## 2012-11-11 DIAGNOSIS — IMO0001 Reserved for inherently not codable concepts without codable children: Secondary | ICD-10-CM | POA: Insufficient documentation

## 2012-11-13 ENCOUNTER — Ambulatory Visit: Payer: BC Managed Care – PPO | Admitting: *Deleted

## 2012-11-18 ENCOUNTER — Ambulatory Visit: Payer: BC Managed Care – PPO | Admitting: *Deleted

## 2012-11-20 ENCOUNTER — Ambulatory Visit: Payer: BC Managed Care – PPO | Admitting: *Deleted

## 2012-11-21 ENCOUNTER — Encounter: Payer: Self-pay | Admitting: *Deleted

## 2012-11-21 DIAGNOSIS — G4700 Insomnia, unspecified: Secondary | ICD-10-CM

## 2012-11-21 DIAGNOSIS — F419 Anxiety disorder, unspecified: Secondary | ICD-10-CM

## 2012-11-22 ENCOUNTER — Ambulatory Visit (INDEPENDENT_AMBULATORY_CARE_PROVIDER_SITE_OTHER): Payer: BLUE CROSS/BLUE SHIELD | Admitting: Nurse Practitioner

## 2012-11-22 ENCOUNTER — Encounter: Payer: Self-pay | Admitting: Nurse Practitioner

## 2012-11-22 VITALS — BP 114/78 | HR 80 | Temp 97.8°F

## 2012-11-22 DIAGNOSIS — F419 Anxiety disorder, unspecified: Secondary | ICD-10-CM | POA: Insufficient documentation

## 2012-11-22 DIAGNOSIS — J0100 Acute maxillary sinusitis, unspecified: Secondary | ICD-10-CM

## 2012-11-22 DIAGNOSIS — G4700 Insomnia, unspecified: Secondary | ICD-10-CM | POA: Insufficient documentation

## 2012-11-22 MED ORDER — LEVOFLOXACIN 500 MG PO TABS: 500.0000 mg | ORAL_TABLET | Freq: Every day | ORAL | Status: DC

## 2012-11-22 NOTE — Assessment & Plan Note (Signed)
Given 10 days of Levaquin.

## 2012-11-22 NOTE — Patient Instructions (Signed)

## 2012-11-22 NOTE — Progress Notes (Signed)
Subjective: Presents complaints of left sided sinus pressure particularly in the maxillary area that began on 3/16. Had secondary exposure to smoke and illness at that time. Pain and burning in the sinuses. Producing dark yellow mucous with slight blood at times. No fever. Rare cough. Sore throat. Slight ear pain. No wheezing. Has been doing a saline rinse. This is the first sinus issues he has had since September, has improved since his sinus surgery. Objective: NAD. Alert, oriented. TMs very retracted, no erythema. Pharynx erythematous with green PND noted. Neck supple with moderate soft slightly tender adenopathy. Lungs clear. Heart regular rate rhythm. Tenderness and mild edema noted along the left maxillary sinus. Assessment: Maxillary sinusitis Plan: Levaquin as directed. Continue saline nasal rinse. OTC meds as directed for congestion. Recheck if symptoms worsen or persist.

## 2012-11-25 ENCOUNTER — Ambulatory Visit: Payer: BC Managed Care – PPO | Admitting: *Deleted

## 2012-11-27 ENCOUNTER — Ambulatory Visit: Payer: BC Managed Care – PPO | Admitting: *Deleted

## 2012-12-02 ENCOUNTER — Ambulatory Visit: Payer: BC Managed Care – PPO | Admitting: *Deleted

## 2012-12-04 ENCOUNTER — Ambulatory Visit: Payer: BC Managed Care – PPO | Attending: Orthopedic Surgery | Admitting: *Deleted

## 2012-12-04 DIAGNOSIS — IMO0001 Reserved for inherently not codable concepts without codable children: Secondary | ICD-10-CM | POA: Insufficient documentation

## 2012-12-04 DIAGNOSIS — M25619 Stiffness of unspecified shoulder, not elsewhere classified: Secondary | ICD-10-CM | POA: Insufficient documentation

## 2012-12-04 DIAGNOSIS — R5381 Other malaise: Secondary | ICD-10-CM | POA: Insufficient documentation

## 2012-12-04 DIAGNOSIS — M25519 Pain in unspecified shoulder: Secondary | ICD-10-CM | POA: Insufficient documentation

## 2012-12-09 ENCOUNTER — Ambulatory Visit: Payer: BC Managed Care – PPO | Admitting: *Deleted

## 2012-12-11 ENCOUNTER — Ambulatory Visit: Payer: BC Managed Care – PPO | Admitting: *Deleted

## 2012-12-16 ENCOUNTER — Ambulatory Visit: Payer: BC Managed Care – PPO | Admitting: *Deleted

## 2012-12-18 ENCOUNTER — Ambulatory Visit: Payer: BC Managed Care – PPO | Admitting: *Deleted

## 2012-12-23 ENCOUNTER — Ambulatory Visit: Payer: BC Managed Care – PPO | Admitting: *Deleted

## 2012-12-25 ENCOUNTER — Ambulatory Visit: Payer: BC Managed Care – PPO | Admitting: *Deleted

## 2012-12-30 ENCOUNTER — Ambulatory Visit: Payer: BC Managed Care – PPO | Admitting: *Deleted

## 2013-01-01 ENCOUNTER — Ambulatory Visit: Payer: BC Managed Care – PPO | Admitting: *Deleted

## 2013-01-06 ENCOUNTER — Ambulatory Visit: Payer: BC Managed Care – PPO | Attending: Orthopedic Surgery | Admitting: *Deleted

## 2013-01-06 DIAGNOSIS — M25519 Pain in unspecified shoulder: Secondary | ICD-10-CM | POA: Insufficient documentation

## 2013-01-06 DIAGNOSIS — R5381 Other malaise: Secondary | ICD-10-CM | POA: Insufficient documentation

## 2013-01-06 DIAGNOSIS — IMO0001 Reserved for inherently not codable concepts without codable children: Secondary | ICD-10-CM | POA: Insufficient documentation

## 2013-01-06 DIAGNOSIS — M25619 Stiffness of unspecified shoulder, not elsewhere classified: Secondary | ICD-10-CM | POA: Insufficient documentation

## 2013-01-08 ENCOUNTER — Ambulatory Visit: Payer: BC Managed Care – PPO | Admitting: Physical Therapy

## 2013-01-13 ENCOUNTER — Ambulatory Visit: Payer: BC Managed Care – PPO | Admitting: *Deleted

## 2013-01-15 ENCOUNTER — Ambulatory Visit: Payer: BC Managed Care – PPO | Admitting: *Deleted

## 2013-01-22 ENCOUNTER — Ambulatory Visit: Payer: BC Managed Care – PPO | Admitting: *Deleted

## 2013-01-24 ENCOUNTER — Ambulatory Visit: Payer: BC Managed Care – PPO | Admitting: *Deleted

## 2013-01-29 ENCOUNTER — Ambulatory Visit: Payer: BC Managed Care – PPO | Admitting: Physical Therapy

## 2013-01-31 ENCOUNTER — Ambulatory Visit: Payer: BC Managed Care – PPO | Admitting: *Deleted

## 2013-02-03 ENCOUNTER — Ambulatory Visit: Payer: BC Managed Care – PPO | Attending: Orthopedic Surgery | Admitting: *Deleted

## 2013-02-03 DIAGNOSIS — M25619 Stiffness of unspecified shoulder, not elsewhere classified: Secondary | ICD-10-CM | POA: Insufficient documentation

## 2013-02-03 DIAGNOSIS — IMO0001 Reserved for inherently not codable concepts without codable children: Secondary | ICD-10-CM | POA: Insufficient documentation

## 2013-02-03 DIAGNOSIS — M25519 Pain in unspecified shoulder: Secondary | ICD-10-CM | POA: Insufficient documentation

## 2013-02-03 DIAGNOSIS — R5381 Other malaise: Secondary | ICD-10-CM | POA: Insufficient documentation

## 2013-02-05 ENCOUNTER — Ambulatory Visit: Payer: BC Managed Care – PPO | Admitting: *Deleted

## 2013-02-10 ENCOUNTER — Ambulatory Visit: Payer: BC Managed Care – PPO

## 2013-02-14 ENCOUNTER — Ambulatory Visit: Payer: BC Managed Care – PPO | Admitting: *Deleted

## 2013-02-17 ENCOUNTER — Ambulatory Visit: Payer: BC Managed Care – PPO | Admitting: *Deleted

## 2013-02-19 ENCOUNTER — Ambulatory Visit: Payer: BC Managed Care – PPO | Admitting: *Deleted

## 2013-05-26 ENCOUNTER — Encounter: Payer: Self-pay | Admitting: Family Medicine

## 2013-05-26 ENCOUNTER — Ambulatory Visit (INDEPENDENT_AMBULATORY_CARE_PROVIDER_SITE_OTHER): Payer: BLUE CROSS/BLUE SHIELD | Admitting: Family Medicine

## 2013-05-26 VITALS — BP 122/84 | Ht 65.0 in | Wt 192.6 lb

## 2013-05-26 DIAGNOSIS — J329 Chronic sinusitis, unspecified: Secondary | ICD-10-CM

## 2013-05-26 MED ORDER — LEVOFLOXACIN 500 MG PO TABS: 500.0000 mg | ORAL_TABLET | Freq: Every day | ORAL | Status: AC

## 2013-05-26 NOTE — Progress Notes (Signed)
  Subjective:    Patient ID: Chase Stout, male    DOB: 04-13-1981, 32 y.o.   MRN: [id number]  Cough This is a new problem. The current episode started 1 to 4 weeks ago. Associated symptoms include nasal congestion and postnasal drip.   Actually went to another urgent care clinic first. Received a round of Augmentin. Took all a. Improved transiently. Now definitely worse. Frontal headache. Cough day and night. Sore throat. Nasal congestion. Just started back on steroid nasal spray the over-the-counter Nasacort.   Review of Systems  HENT: Positive for postnasal drip.   Respiratory: Positive for cough.        Objective:   Physical Exam  Alert mild malaise. Frontal maxillary tenderness. TMs partially obscured pharynx normal neck supple. Lungs clear heart regular in rhythm      Assessment & Plan:  Impression rhinosinusitis resistant in nature plan Levaquin 500 twice a day 10 days. Symptomatic care discussed. WSL

## 2014-05-08 ENCOUNTER — Ambulatory Visit (INDEPENDENT_AMBULATORY_CARE_PROVIDER_SITE_OTHER): Payer: BC Managed Care – PPO | Admitting: Family Medicine

## 2014-05-08 ENCOUNTER — Encounter: Payer: Self-pay | Admitting: Family Medicine

## 2014-05-08 VITALS — BP 114/76 | Temp 98.1°F | Ht 65.0 in | Wt 220.4 lb

## 2014-05-08 DIAGNOSIS — M26609 Unspecified temporomandibular joint disorder, unspecified side: Secondary | ICD-10-CM

## 2014-05-08 MED ORDER — ETODOLAC 400 MG PO TABS: 400.0000 mg | ORAL_TABLET | Freq: Two times a day (BID) | ORAL | Status: DC

## 2014-05-08 NOTE — Progress Notes (Signed)
   Subjective:    Patient ID: Chase Stout, male    DOB: 05/21/1981, 33 y.o.   MRN: [id number]  HPI Patient is here today because his jaw has been popping on the right side. Facial numbness noted on the right side.popping lately,  Was eating b fast, popped with movement  Ear sensisitivity  Pops regularly  Fullness  No sig gum chewing  Mouth guard hx  No hx of jaw locking   Right ear pain noted also. This has been present for about 3 days now. Treatments tried: Ibuprofen. Minimal relief noted.  Patient states he has no other concerns at this time.   Uses advil three or two prn for discmfort    Review of Systems No fever no chills no cough no congestion    Objective:   Physical Exam  Alert good hydration. Lungs clear. Heart rare rhythm. H&T normal. Right jaw tender to complete opening positive tender TMJ. Tympanic membrane normal no adenopathy      Assessment & Plan:  Impression TMJ pain discuss plan anti-inflammatory medicine prescribed. Avoid significant chewing. Expect gradual resolution. If not discuss with dentists. WSL

## 2014-09-01 ENCOUNTER — Ambulatory Visit (INDEPENDENT_AMBULATORY_CARE_PROVIDER_SITE_OTHER): Payer: BC Managed Care – PPO | Admitting: Family Medicine

## 2014-09-01 ENCOUNTER — Encounter: Payer: Self-pay | Admitting: Family Medicine

## 2014-09-01 VITALS — BP 112/78 | Temp 98.7°F | Ht 65.0 in | Wt 235.0 lb

## 2014-09-01 DIAGNOSIS — J300 Vasomotor rhinitis: Secondary | ICD-10-CM

## 2014-09-01 DIAGNOSIS — E079 Disorder of thyroid, unspecified: Secondary | ICD-10-CM

## 2014-09-01 DIAGNOSIS — Z13220 Encounter for screening for lipoid disorders: Secondary | ICD-10-CM

## 2014-09-01 DIAGNOSIS — K219 Gastro-esophageal reflux disease without esophagitis: Secondary | ICD-10-CM

## 2014-09-01 MED ORDER — AZELASTINE HCL 0.1 % NA SOLN: 2.0000 | Freq: Two times a day (BID) | NASAL | Status: DC

## 2014-09-01 MED ORDER — PANTOPRAZOLE SODIUM 40 MG PO TBEC: 40.0000 mg | DELAYED_RELEASE_TABLET | Freq: Every day | ORAL | Status: DC

## 2014-09-01 MED ORDER — CEFDINIR 300 MG PO CAPS: 300.0000 mg | ORAL_CAPSULE | Freq: Two times a day (BID) | ORAL | Status: DC

## 2014-09-01 NOTE — Progress Notes (Signed)
   Subjective:    Patient ID: Chase Stout, male    DOB: 07/30/1981, 33 y.o.   MRN: [id number]  HPI Patient is here today b/c he cannot quit clearing his throat.  He said his throat/neck is swollen.  The drainage from his throat gets worst whenever he is sick. Was around smokers over the holidays, which made more post-nasal drainage.cong and drainage substantial. Has been coming on over time  Occurs all the time  Finds himself clearing his throat multiple times per day  Worsens at night  Also has substantial spring and fall allergy symtoms  Pt feels full in the ne k, and felt tight  Wants to get his thyroid checked.  Food getting lodged.  Noticed this months ago.  Has gained forty punds  n o sig fam hx of thyr disease  Uses nasocort otc and benadry at night  And dayquil prn   Months and months worth of cong and drainage and throat clearing, tends to be worse after sickness  No symptoms of reflux or heartburn, does not have to use but occas tums Fair amnt of caffeine   Review of Systems Slight headache fair appetite considerable weight gain past year. No frank reflux symptoms.    Objective:   Physical Exam Alert no major distress. Intermittent throat clearing during exam H&T moderate nasal congestion pharynx no obvious erythema neck no adenopathy. No enlarged thyroid neck supple lungs clear heart regular in rhythm abdomen benign       Assessment & Plan:  Impression tremendous throat clearing likely multi-factorial. Discussed at great length. Easily 25-30 minutes spent in discussion. Likely an element of reflux, vasomotor rhinitis, allergy with patient exposed to cats and causing face puffiness, weight gain leading to reflux. Patient is concerned about thyroid. We will check this. I think it will be. Initiate proton pump inhibitor. Initiate Astelin. Trial of antibiotics. Recheck as scheduled. WSL

## 2014-09-02 LAB — GLUCOSE, RANDOM: Glucose, Bld: 83 mg/dL (ref 70–99)

## 2014-09-02 LAB — LIPID PANEL
CHOLESTEROL: 191 mg/dL (ref 0–200)
HDL: 33 mg/dL — AB (ref >39)
LDL Cholesterol: 126 mg/dL — ABNORMAL HIGH (ref 0–99)
Total CHOL/HDL Ratio: 5.8 Ratio
Triglycerides: 158 mg/dL — ABNORMAL HIGH (ref <150)
VLDL: 32 mg/dL (ref 0–40)

## 2014-09-02 LAB — T4: T4 TOTAL: 8.5 ug/dL (ref 4.5–12.0)

## 2014-09-02 LAB — TSH: TSH: 1.665 u[IU]/mL (ref 0.350–4.500)

## 2014-09-06 ENCOUNTER — Encounter: Payer: Self-pay | Admitting: Family Medicine

## 2014-09-30 ENCOUNTER — Ambulatory Visit (INDEPENDENT_AMBULATORY_CARE_PROVIDER_SITE_OTHER): Payer: BLUE CROSS/BLUE SHIELD | Admitting: Family Medicine

## 2014-09-30 ENCOUNTER — Encounter: Payer: Self-pay | Admitting: Family Medicine

## 2014-09-30 VITALS — BP 132/88 | Ht 65.0 in | Wt 232.0 lb

## 2014-09-30 DIAGNOSIS — K219 Gastro-esophageal reflux disease without esophagitis: Secondary | ICD-10-CM

## 2014-09-30 DIAGNOSIS — J300 Vasomotor rhinitis: Secondary | ICD-10-CM

## 2014-09-30 MED ORDER — AZELASTINE HCL 0.1 % NA SOLN: 2.0000 | Freq: Two times a day (BID) | NASAL | Status: DC

## 2014-09-30 NOTE — Progress Notes (Signed)
   Subjective:    Patient ID: Chase Stout, male    DOB: 10/24/1980, 34 y.o.   MRN: [id number]  HPI Patient is here today for a recheck on his throat.  He is constantly having to clear his throat.  Pt said he has not noticed a difference in his throat.  Pt did say that the Astelin is helping out a lot. He has not had to take a decongestant since he started that.   astelin has helped, opened up a lot better,  Actuation runs low on the pump,  Somewhat worse tonight   protonix may have helped a little, felt better  Clearing the throat still an aggravation  No fam hx of cad or stroke  Sense of fullness and throat cong at times, still    Review of Systems No headache no chest pain no back pain abdominal pain no change in bowel habits no blood in stool ROS otherwise negative    Objective:   Physical Exam   Alert no acute distress. Vital stable HET normal. Mild nasal congestion thyroid nonpalpable. Lungs clear heart regular in rhythm.     Assessment & Plan:  Impression chronic nasal congestion much improved with Astelin No. 2 chronic clearing of throat minimal improvement. Discussed plan patient happy to maintain same therapy for now. Her intervention and future would recommend ENT and a medical school setting. WSL

## 2014-12-22 ENCOUNTER — Other Ambulatory Visit: Payer: Self-pay | Admitting: Family Medicine

## 2014-12-23 ENCOUNTER — Other Ambulatory Visit: Payer: Self-pay | Admitting: Family Medicine

## 2015-03-25 ENCOUNTER — Telehealth: Payer: Self-pay | Admitting: Family Medicine

## 2015-03-25 NOTE — Telephone Encounter (Signed)
Patient has [employer] and he is currently on nasal spray and protonix. Appointment given for 04/12/2015 with Dr. Ermalinda Memos.

## 2015-04-12 ENCOUNTER — Ambulatory Visit: Payer: BLUE CROSS/BLUE SHIELD | Admitting: Family Medicine

## 2015-04-29 ENCOUNTER — Ambulatory Visit (INDEPENDENT_AMBULATORY_CARE_PROVIDER_SITE_OTHER): Payer: BLUE CROSS/BLUE SHIELD

## 2015-04-29 ENCOUNTER — Encounter (INDEPENDENT_AMBULATORY_CARE_PROVIDER_SITE_OTHER): Payer: Self-pay

## 2015-04-29 ENCOUNTER — Ambulatory Visit (INDEPENDENT_AMBULATORY_CARE_PROVIDER_SITE_OTHER): Payer: BLUE CROSS/BLUE SHIELD | Admitting: Family Medicine

## 2015-04-29 ENCOUNTER — Encounter: Payer: Self-pay | Admitting: Family Medicine

## 2015-04-29 VITALS — BP 131/84 | HR 76 | Temp 97.8°F | Ht 65.0 in | Wt 241.8 lb

## 2015-04-29 DIAGNOSIS — E669 Obesity, unspecified: Secondary | ICD-10-CM | POA: Diagnosis not present

## 2015-04-29 DIAGNOSIS — J300 Vasomotor rhinitis: Secondary | ICD-10-CM

## 2015-04-29 DIAGNOSIS — R05 Cough: Secondary | ICD-10-CM | POA: Diagnosis not present

## 2015-04-29 DIAGNOSIS — R059 Cough, unspecified: Secondary | ICD-10-CM

## 2015-04-29 DIAGNOSIS — J45909 Unspecified asthma, uncomplicated: Secondary | ICD-10-CM | POA: Insufficient documentation

## 2015-04-29 MED ORDER — PREDNISONE 20 MG PO TABS: 40.0000 mg | ORAL_TABLET | Freq: Every day | ORAL | Status: DC

## 2015-04-29 NOTE — Patient Instructions (Signed)
Great to meet you!  I think you are fine to try a trial off of your Protonix medicine now, i would restart if your cough worsens or you have severe heartburn  I think you are doing very good with your diet and exercise, I would like to see you again around January to check you out again  You can meet with our pharmacist for trouble shooting as well.

## 2015-04-29 NOTE — Progress Notes (Signed)
   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  States that he has vasomotor rhinitis, helped quite a bit with Astelin and Flonase which he takes every day  Cough He complains of a chronic cough it's worse over last week. It's worse after being exposed to smoke on and off duty job about a week and a half ago. He describes frequent throat clearing, feeling like he has junk in his throat, and productive cough. He denies dyspnea, malaise, fever chills, sweats.  He rides his stationary bike at least 30 minutes every night, he also lifts weights. He avoids carbohydrate high foods and eat salads and lean meats as frequently as possible. His thyroid was checked in December and is normal, he declines a recheck today.  PMH: Smoking status noted ROS: Per HPI, otherwise negative  Objective: BP 131/84 mmHg  Pulse 76  Temp(Src) 97.8 F (36.6 C) (Oral)  Ht  (1.651 m)  Wt 241 lb 12.8 oz (109.68 kg)  BMI 40.24 kg/m2 Gen: NAD, alert, cooperative with exam HEENT: NCAT Neck: Supple, trachea midline CV: RRR, good S1/S2, no murmur Resp: CTABL, no wheezes, non-labored Abd: SNTND, BS present, no guarding or organomegaly Ext: No edema, warm Neuro: Alert and oriented, No gross deficits Skin: No rash  Assessment and plan:  # Vasomotor rhinitis Continue Astelin and Flonase Follow-up as needed  # Chronic cough, with exacerbation Has tried PPI now for greater than 6 months with no improvement. The pain is reasonable take a trial off of this. Unlikely to be postnasal drip given good nasal steroid compliance Chest x-ray, prednisone course  # Obesity Discussed diet and exercise, congratulated I would recommend repeat thyroid testing annually Continue to focus on cardio, continue low-carb high-protein diet Discussed possibility discussion with our pharmacist, he'll  consider    Orders Placed This Encounter  Procedures  . DG Chest 2 View    Standing Status: Future     Number of Occurrences:      Standing Expiration Date: 06/28/2016    Order Specific Question:  Reason for Exam (SYMPTOM  OR DIAGNOSIS REQUIRED)    Answer:  cough    Order Specific Question:  Preferred imaging location?    Answer:  Internal    Meds ordered this encounter  Medications  . predniSONE (DELTASONE) 20 MG tablet    Sig: Take 2 tablets (40 mg total) by mouth daily with breakfast.    Dispense:  10 tablet    Refill:  0    Murtis Sink, MD [place] [facility] 04/29/2015, 1:25 PM

## 2015-05-21 ENCOUNTER — Other Ambulatory Visit: Payer: Self-pay | Admitting: Family Medicine

## 2015-05-24 ENCOUNTER — Ambulatory Visit (INDEPENDENT_AMBULATORY_CARE_PROVIDER_SITE_OTHER): Payer: BLUE CROSS/BLUE SHIELD | Admitting: Family Medicine

## 2015-05-24 ENCOUNTER — Encounter: Payer: Self-pay | Admitting: Family Medicine

## 2015-05-24 VITALS — BP 122/82 | HR 85 | Temp 97.7°F | Ht 65.0 in | Wt 243.6 lb

## 2015-05-24 DIAGNOSIS — R05 Cough: Secondary | ICD-10-CM

## 2015-05-24 DIAGNOSIS — R059 Cough, unspecified: Secondary | ICD-10-CM

## 2015-05-24 MED ORDER — ALBUTEROL SULFATE HFA 108 (90 BASE) MCG/ACT IN AERS: 2.0000 | INHALATION_SPRAY | Freq: Four times a day (QID) | RESPIRATORY_TRACT | Status: DC | PRN

## 2015-05-24 MED ORDER — AMOXICILLIN-POT CLAVULANATE 875-125 MG PO TABS: 1.0000 | ORAL_TABLET | Freq: Two times a day (BID) | ORAL | Status: DC

## 2015-05-24 MED ORDER — ALBUTEROL SULFATE (2.5 MG/3ML) 0.083% IN NEBU
2.5000 mg | INHALATION_SOLUTION | Freq: Once | RESPIRATORY_TRACT | Status: AC
  Administered 2015-05-24: 2.5 mg via RESPIRATORY_TRACT

## 2015-05-24 MED ORDER — MOMETASONE FUROATE 50 MCG/ACT NA SUSP: 2.0000 | Freq: Every day | NASAL | Status: DC

## 2015-05-24 NOTE — Patient Instructions (Signed)
Great to see you again!  Come back in 3-4 weeks to discuss your cough  Try the albuterol like we discussed Start the antibiotics Change to nasoex

## 2015-05-24 NOTE — Progress Notes (Signed)
   HPI  Patient presents today for continued cough  Patient was seen on August 25 to establish care and treated for cough with prednisone and had a normal chest x-ray that time. He states that he had really good improvement with prednisone and then it slowly crept back over the next several days.  He states that he previously saw an allergist and was prescribed Astelin and a nasal steroid which is helped for quite a while. He uses Nasacort which is very effective for him but Flonase did not help much previously.  He states that over the last month he's been coughing with a sore throat, and nasal congestion. Over the last 2 days he's developed left-sided facial pain. While he was at the beach over the last week and a half he had persistent cough. He states that the only time the cough is better was during the course of prednisone.  PMH: Smoking status noted ROS: Per HPI  Objective: BP 122/82 mmHg  Pulse 85  Temp(Src) 97.7 F (36.5 C) (Oral)  Ht  (1.651 m)  Wt 243 lb 9.6 oz (110.496 kg)  BMI 40.54 kg/m2 Gen: NAD, alert, cooperative with exam HEENT: NCAT, nares clear, oropharynx clear CV: RRR, good S1/S2, no murmur Resp: CTABL, no wheezes, non-labored, persistent cough throughout exam Ext: No edema, warm Neuro: Alert and oriented, No gross deficits   After neb- cough improved, symptoms improved  Assessment and plan:  # Chronic cough Likely 2/2 post nasal drip and possibly reactive airway disease Trial of albuterol Change nasocort to nasonex Continue astelin Follow up 1 month, consider referral to allergy and immunology With 1 month of worsening cough, purulent sputum, and facial pain will cover with augmentin     Meds ordered this encounter  Medications  . amoxicillin-clavulanate (AUGMENTIN) 875-125 MG per tablet    Sig: Take 1 tablet by mouth 2 (two) times daily.    Dispense:  20 tablet    Refill:  0  . mometasone (NASONEX) 50 MCG/ACT nasal spray    Sig: Place  2 sprays into the nose daily.    Dispense:  17 g    Refill:  12  . albuterol (PROVENTIL HFA;VENTOLIN HFA) 108 (90 BASE) MCG/ACT inhaler    Sig: Inhale 2 puffs into the lungs every 6 (six) hours as needed for wheezing or shortness of breath.    Dispense:  1 Inhaler    Refill:  0  . albuterol (PROVENTIL) (2.5 MG/3ML) 0.083% nebulizer solution 2.5 mg    Sig:     Murtis Sink, MD [place] [facility] 05/24/2015, 3:56 PM

## 2015-06-14 ENCOUNTER — Ambulatory Visit (INDEPENDENT_AMBULATORY_CARE_PROVIDER_SITE_OTHER): Payer: BLUE CROSS/BLUE SHIELD | Admitting: Family Medicine

## 2015-06-14 ENCOUNTER — Encounter: Payer: Self-pay | Admitting: Family Medicine

## 2015-06-14 VITALS — BP 118/78 | HR 84 | Temp 97.3°F | Ht 65.0 in | Wt 242.8 lb

## 2015-06-14 DIAGNOSIS — K219 Gastro-esophageal reflux disease without esophagitis: Secondary | ICD-10-CM | POA: Diagnosis not present

## 2015-06-14 MED ORDER — FLUTICASONE FUROATE-VILANTEROL 100-25 MCG/INH IN AEPB: 1.0000 | INHALATION_SPRAY | Freq: Every day | RESPIRATORY_TRACT | Status: DC

## 2015-06-14 NOTE — Patient Instructions (Addendum)
Great to see you again!  Try out the breo, once daily every day. If it is helping your cough, I think you will be able to tell a difference in 1-2 weeks  I think your tongue swelling will get better.  Signs of strep throat include tender swollen lymph nodes, malaise/fever, sore throat. If you develop these please feel free to come back.

## 2015-06-14 NOTE — Progress Notes (Signed)
   HPI  Patient presents today  For evaluation of cough, postnasal drip, swollen tongue.  Swollen tongue Reports oral discomfort starting 4 days ago, he did like his tongue was pushing up against his teeth and 5. This has persisted in the last 3 days and seems to better today. He denies any new medicines or foods. He has started albuterol and Nasonex several weeks before the onset. He denies any sore throat, fever, malaise, loss of appetite,\ He does have some discomfort with swallowing  Cough Cough improved some with albuterol. Feels that the albuterol is helping him breathe. He's been working out without problem but then still seems to have intermittent dyspnea with regular everyday activities like walking to the grocery store. Denies orthopnea, leg swelling.  Postnasal drip  greatly improved with Nasonex Continuing Astelin as well Has seen allergy and immunology previously and been told he had dust mites and mold allergies.  PMH: Smoking status noted ROS: Per HPI  Objective: BP 118/78 mmHg  Pulse 84  Temp(Src) 97.3 F (36.3 C) (Oral)  Ht  (1.651 m)  Wt 242 lb 12.8 oz (110.133 kg)  BMI 40.40 kg/m2 Gen: NAD, alert, cooperative with exam HEENT: NCAT, nares clear, oropharynx clear Neck: Left neck with single swollen lymph node on the left, nontender to palpation CV: RRR, good S1/S2, no murmur Resp: Nonlabored, some scattered expiratory wheeze rarely Ext: No edema, warm Neuro: Alert and oriented, No gross deficits  Assessment and plan:  # Cough With improvement with albuterol and also some improvement in dyspnea with albuterol will go ahead and treat for reactive airway disease Possible underlying asthma, no reasonably COPD Trial of breo, samples for 1 month given  # Postnasal drip Improved with Nasonex, continue Nasonex and Astelin  # GERD Rarely symptomatic, using PPI intermittently Change to pepcid  # Swollen tongue Unclear etiology, not appreciated on  exam Single swollen lymph node on the left correlating with his left-sided symptoms Unlikely to be angioedema with no possible inciting medications or allergens Discussed signs of strep throat, he will return if these develop   Meds ordered this encounter  Medications  . Fluticasone Furoate-Vilanterol (BREO ELLIPTA) 100-25 MCG/INH AEPB    Sig: Inhale 1 puff into the lungs daily.    Dispense:  2 each    Refill:  0    Murtis Sink, MD [place] [facility] 06/14/2015, 8:43 AM

## 2015-06-16 ENCOUNTER — Encounter: Payer: Self-pay | Admitting: *Deleted

## 2015-06-18 ENCOUNTER — Ambulatory Visit (INDEPENDENT_AMBULATORY_CARE_PROVIDER_SITE_OTHER): Payer: BLUE CROSS/BLUE SHIELD | Admitting: Pediatrics

## 2015-06-18 ENCOUNTER — Encounter: Payer: Self-pay | Admitting: Pediatrics

## 2015-06-18 VITALS — BP 115/78 | HR 85 | Temp 97.7°F | Ht 65.0 in | Wt 244.4 lb

## 2015-06-18 DIAGNOSIS — J029 Acute pharyngitis, unspecified: Secondary | ICD-10-CM | POA: Diagnosis not present

## 2015-06-18 LAB — POCT RAPID STREP A (OFFICE): Rapid Strep A Screen: NEGATIVE

## 2015-06-18 NOTE — Patient Instructions (Signed)
Chloraseptic throat spray Lozenges Ibuprofen and tylenol throughout the day

## 2015-06-18 NOTE — Progress Notes (Signed)
Subjective:    Patient ID: Chase Stout, male    DOB: 01/11/1981, 34 y.o.   MRN: [id number]  CC: sore throat  HPI: Chase Stout is a 34 y.o. male presenting on 06/18/2015 for Sore Throat and Cough  Here four days ago. At that time having pain in tongue, having trouble swallowing Hurts to talk, swallow, irritated L side of his throat Food goes down easier than liquids Feels puffy side of his neck Ongoing at least a couple of days Cough ongoing for a while, has been working with Dr. Ermalinda MemosBradshaw. Cough is  Different now that sick, Subjective fevers/felt warm, no back aches/chills Making it to work, not going to gym way he usually does  Relevant past medical, surgical, family and social history reviewed and updated as indicated. Interim medical history since our last visit reviewed. Allergies and medications reviewed and updated.   ROS: Per HPI unless specifically indicated above  Past Medical History Patient Active Problem List   Diagnosis Date Noted  . GERD (gastroesophageal reflux disease) 06/14/2015  . Cough 04/29/2015  . Obesity 04/29/2015  . Vasomotor rhinitis 09/01/2014  . Insomnia 11/22/2012  . Anxiety 11/22/2012  . Rotator cuff dysfunction 06/18/2012  . Osteoarthritis of left shoulder 06/18/2012  . Shoulder pain, left 06/18/2012    Current Outpatient Prescriptions  Medication Sig Dispense Refill  . azelastine (ASTELIN) 0.1 % nasal spray USE 2 SPRAYS IN EACH NOSTRIL TWICE A DAY AS DIRECTED 30 mL 3  . Fluticasone Furoate-Vilanterol (BREO ELLIPTA) 100-25 MCG/INH AEPB Inhale 1 puff into the lungs daily. 2 each 0  . mometasone (NASONEX) 50 MCG/ACT nasal spray Place 2 sprays into the nose daily. 17 g 12  . pantoprazole (PROTONIX) 40 MG tablet TAKE 1 TABLET BY MOUTH EVERY DAY 30 tablet 0  . albuterol (PROVENTIL HFA;VENTOLIN HFA) 108 (90 BASE) MCG/ACT inhaler Inhale 2 puffs into the lungs every 6 (six) hours as needed for wheezing or shortness of breath. (Patient not  taking: Reported on 06/18/2015) 1 Inhaler 0   No current facility-administered medications for this visit.       Objective:    BP 115/78 mmHg  Pulse 85  Temp(Src) 97.7 F (36.5 C) (Oral)  Ht 5\' 5"  (1.651 m)  Wt 244 lb 6.4 oz (110.859 kg)  BMI 40.67 kg/m2  Wt Readings from Last 3 Encounters:  06/18/15 244 lb 6.4 oz (110.859 kg)  06/14/15 242 lb 12.8 oz (110.133 kg)  05/24/15 243 lb 9.6 oz (110.496 kg)    Gen: NAD, alert, cooperative with exam, NCAT EYES: EOMI, no scleral injection or icterus ENT:  TMs dull b/l, normal light reflex, some fluid behind TMs, OP with mild erythema on tonsillar pillars, normal appearing tonsils b/l, no lesions or ulcers LYMPH: 1 cm anterior cervical lymph node CV: NRRR, normal S1/S2, no murmur, distal pulses 2+ b/l Resp: CTABL, no wheezes, normal WOB Abd: +BS, soft, NTND. no guarding or organomegaly Ext: No edema, warm Neuro: Alert and oriented     Assessment & Plan:   [name] was seen today for sore throat and cough. Cough has been worse over last two days, earlier getting better with the medication changes made with Dr. Ermalinda MemosBradshaw. Exam benign, anterior LAD. Will send rapid strep, culture if negative. Symptomatic care discussed with pt, see pt instructions.  Diagnoses and all orders for this visit:  Sore throat -     POCT rapid strep A -     Culture, Group A Strep  Follow up  plan: Return if symptoms worsen or fail to improve.  Rex Kras, MD [facility] 06/18/2015, 3:27 PM

## 2015-06-21 LAB — CULTURE, GROUP A STREP: Strep A Culture: NEGATIVE

## 2015-06-23 ENCOUNTER — Other Ambulatory Visit: Payer: Self-pay | Admitting: Family Medicine

## 2015-06-24 ENCOUNTER — Other Ambulatory Visit: Payer: Self-pay

## 2015-06-24 MED ORDER — PANTOPRAZOLE SODIUM 40 MG PO TBEC: 40.0000 mg | DELAYED_RELEASE_TABLET | Freq: Every day | ORAL | Status: DC

## 2015-07-16 ENCOUNTER — Ambulatory Visit: Payer: BLUE CROSS/BLUE SHIELD | Admitting: Family Medicine

## 2015-07-19 ENCOUNTER — Encounter: Payer: Self-pay | Admitting: Family Medicine

## 2015-07-19 ENCOUNTER — Ambulatory Visit (INDEPENDENT_AMBULATORY_CARE_PROVIDER_SITE_OTHER): Payer: BLUE CROSS/BLUE SHIELD | Admitting: Family Medicine

## 2015-07-19 ENCOUNTER — Encounter (INDEPENDENT_AMBULATORY_CARE_PROVIDER_SITE_OTHER): Payer: Self-pay

## 2015-07-19 VITALS — BP 123/79 | HR 79 | Temp 97.2°F | Ht 65.0 in | Wt 243.2 lb

## 2015-07-19 DIAGNOSIS — R220 Localized swelling, mass and lump, head: Secondary | ICD-10-CM | POA: Diagnosis not present

## 2015-07-19 DIAGNOSIS — Z23 Encounter for immunization: Secondary | ICD-10-CM | POA: Diagnosis not present

## 2015-07-19 MED ORDER — FLUTICASONE FUROATE-VILANTEROL 100-25 MCG/INH IN AEPB: 1.0000 | INHALATION_SPRAY | Freq: Every day | RESPIRATORY_TRACT | Status: DC

## 2015-07-19 NOTE — Progress Notes (Signed)
   HPI  Patient presents today here to follow-up for asthma and tongue swelling.  Asthma Improved on new Inhaler Cough and dyspnea nearly resolved Has been without breo for 1 week and can see a very big difference  Tongue swelling Resolved spontaneously a few days after his last visit, he did have to see another physician 4 days later HIS symptoms worsened.  He has post nasal drip and morning throat clearing with some transient hoarseness, he still feels much better with Nasonex and Astelin   PMH: Smoking status noted ROS: Per HPI  Objective: BP 123/79 mmHg  Pulse 79  Temp(Src) 97.2 F (36.2 C) (Oral)  Ht 5\' 5"  (1.651 m)  Wt 243 lb 3.2 oz (110.315 kg)  BMI 40.47 kg/m2 Gen: NAD, alert, cooperative with exam HEENT: NCAT CV: RRR, good S1/S2, no murmur Resp: CTABL, no wheezes, non-labored Ext: No edema, warm Neuro: Alert and oriented, No gross deficits  Assessment and plan:  # Asthma Responded well to breo Continue albuterol F/u 6 months  # Tongue swelling Resolved, unclear etiology  # post nasal drip  Managed with nasonex and astelin, feels better on flonase but still has morning symptoms No changes   Orders Placed This Encounter  Procedures  . Flu Vaccine QUAD 36+ mos IM    Meds ordered this encounter  Medications  . Fluticasone Furoate-Vilanterol (BREO ELLIPTA) 100-25 MCG/INH AEPB    Sig: Inhale 1 puff into the lungs daily.    Dispense:  1 each    Refill:  11    Murtis SinkSam Phil Corti, MD [facility] 07/19/2015, 8:11 AM

## 2015-07-19 NOTE — Patient Instructions (Signed)
Great to see you!  Lets plan to see you in 6 months, unless you need us sooner.

## 2015-08-04 ENCOUNTER — Telehealth: Payer: Self-pay | Admitting: Family Medicine

## 2015-08-04 MED ORDER — FLUTICASONE FUROATE-VILANTEROL 100-25 MCG/INH IN AEPB: 1.0000 | INHALATION_SPRAY | Freq: Every day | RESPIRATORY_TRACT | Status: DC

## 2015-08-04 NOTE — Telephone Encounter (Signed)
done

## 2015-10-14 ENCOUNTER — Encounter: Payer: Self-pay | Admitting: Family Medicine

## 2015-10-14 ENCOUNTER — Ambulatory Visit (INDEPENDENT_AMBULATORY_CARE_PROVIDER_SITE_OTHER): Payer: BLUE CROSS/BLUE SHIELD | Admitting: Family Medicine

## 2015-10-14 VITALS — BP 121/81 | HR 92 | Temp 98.7°F | Ht 65.0 in | Wt 246.4 lb

## 2015-10-14 DIAGNOSIS — J019 Acute sinusitis, unspecified: Secondary | ICD-10-CM

## 2015-10-14 MED ORDER — AZITHROMYCIN 250 MG PO TABS
ORAL_TABLET | ORAL | Status: DC
Start: 2015-10-14 — End: 2015-11-25

## 2015-10-14 NOTE — Progress Notes (Signed)
BP 121/81 mmHg  Pulse 92  Temp(Src) 98.7 F (37.1 C) (Oral)  Ht  (1.651 m)  Wt 246 lb 6.4 oz (111.766 kg)  BMI 41.00 kg/m2   Subjective:    Patient ID: Chase Stout, male    DOB: [birth year], 35 y.o.   MRN: [id number]  HPI: Chase Stout is a 35 y.o. male presenting on 10/14/2015 for Cough; Sinusitis; and Fever   HPI Sinus congestion and fever Patient has had sinus congestion and fever for the past 2 days. He's also had postnasal drainage and headaches and nasal drainage and cough. He denies any wheezing or shortness of breath from his asthma. But this sometimes does turn into that if left for too long. His wife was ill prior to him. He said his fever was higher last night with a Murmur what was his wife took it. He has been using both his nasal sprays and his asthma inhaler which have been helping some. He is also been taking Advil to control the fevers and headaches and that's been helping mostly.  Relevant past medical, surgical, family and social history reviewed and updated as indicated. Interim medical history since our last visit reviewed. Allergies and medications reviewed and updated.  Review of Systems  Constitutional: Positive for fever. Negative for chills.  HENT: Positive for congestion, postnasal drip, rhinorrhea, sinus pressure and sore throat. Negative for ear discharge, ear pain, sneezing and voice change.   Eyes: Negative for pain, discharge, redness and visual disturbance.  Respiratory: Negative for shortness of breath and wheezing.   Cardiovascular: Negative for chest pain and leg swelling.  Gastrointestinal: Negative for abdominal pain, diarrhea and constipation.  Genitourinary: Negative for difficulty urinating.  Musculoskeletal: Negative for back pain and gait problem.  Skin: Negative for rash.  Neurological: Negative for syncope, light-headedness and headaches.  All other systems reviewed and are negative.   Per HPI unless specifically indicated  above     Medication List       This list is accurate as of: 10/14/15  4:16 PM.  Always use your most recent med list.               albuterol 108 (90 Base) MCG/ACT inhaler  Commonly known as:  PROVENTIL HFA;VENTOLIN HFA  Inhale 2 puffs into the lungs every 6 (six) hours as needed for wheezing or shortness of breath.     azelastine 0.1 % nasal spray  Commonly known as:  ASTELIN  USE 2 SPRAYS IN EACH NOSTRIL TWICE A DAY AS DIRECTED     azithromycin 250 MG tablet  Commonly known as:  ZITHROMAX  Take 2 the first day and then one each day after.     Fluticasone Furoate-Vilanterol 100-25 MCG/INH Aepb  Commonly known as:  BREO ELLIPTA  Inhale 1 puff into the lungs daily.     mometasone 50 MCG/ACT nasal spray  Commonly known as:  NASONEX  Place 2 sprays into the nose daily.     pantoprazole 40 MG tablet  Commonly known as:  PROTONIX  Take 1 tablet (40 mg total) by mouth daily.           Objective:    BP 121/81 mmHg  Pulse 92  Temp(Src) 98.7 F (37.1 C) (Oral)  Ht  (1.651 m)  Wt 246 lb 6.4 oz (111.766 kg)  BMI 41.00 kg/m2  Wt Readings from Last 3 Encounters:  10/14/15 246 lb 6.4 oz (111.766 kg)  07/19/15 243 lb 3.2 oz (110.315  kg)  06/18/15 244 lb 6.4 oz (110.859 kg)    Physical Exam  Constitutional: He is oriented to person, place, and time. He appears well-developed and well-nourished. No distress.  HENT:  Right Ear: Tympanic membrane, external ear and ear canal normal.  Left Ear: Tympanic membrane, external ear and ear canal normal.  Nose: Mucosal edema and rhinorrhea present. No sinus tenderness. No epistaxis. Right sinus exhibits maxillary sinus tenderness. Right sinus exhibits no frontal sinus tenderness. Left sinus exhibits maxillary sinus tenderness. Left sinus exhibits no frontal sinus tenderness.  Mouth/Throat: Uvula is midline and mucous membranes are normal. Posterior oropharyngeal edema and posterior oropharyngeal erythema present. No oropharyngeal  exudate or tonsillar abscesses.  Eyes: Conjunctivae and EOM are normal. Pupils are equal, round, and reactive to light. Right eye exhibits no discharge. No scleral icterus.  Neck: Neck supple. No thyromegaly present.  Cardiovascular: Normal rate, regular rhythm, normal heart sounds and intact distal pulses.   No murmur heard. Pulmonary/Chest: Effort normal and breath sounds normal. No respiratory distress. He has no wheezes.  Musculoskeletal: Normal range of motion. He exhibits no edema.  Lymphadenopathy:    He has no cervical adenopathy.  Neurological: He is alert and oriented to person, place, and time. Coordination normal.  Skin: Skin is warm and dry. No rash noted. He is not diaphoretic.  Psychiatric: He has a normal mood and affect. His behavior is normal.  Vitals reviewed.     Assessment & Plan:   Problem List Items Addressed This Visit    None    Visit Diagnoses    Acute rhinosinusitis    -  Primary    Sinus congestion has been persistent with fever, will send azithromycin and continue his sprays no signs of wheezing or asthma today    Relevant Medications    azithromycin (ZITHROMAX) 250 MG tablet       Follow up plan: Return if symptoms worsen or fail to improve.  Counseling provided for all of the vaccine components No orders of the defined types were placed in this encounter.    Arville Care, MD [facility] 10/14/2015, 4:16 PM

## 2015-11-25 ENCOUNTER — Ambulatory Visit (INDEPENDENT_AMBULATORY_CARE_PROVIDER_SITE_OTHER): Payer: BLUE CROSS/BLUE SHIELD | Admitting: Family Medicine

## 2015-11-25 ENCOUNTER — Encounter: Payer: Self-pay | Admitting: Family Medicine

## 2015-11-25 VITALS — BP 130/85 | HR 88 | Temp 97.3°F | Ht 65.0 in | Wt 239.8 lb

## 2015-11-25 DIAGNOSIS — E669 Obesity, unspecified: Secondary | ICD-10-CM

## 2015-11-25 DIAGNOSIS — Z0000 Encounter for general adult medical examination without abnormal findings: Secondary | ICD-10-CM

## 2015-11-25 DIAGNOSIS — J300 Vasomotor rhinitis: Secondary | ICD-10-CM

## 2015-11-25 NOTE — Progress Notes (Signed)
   HPI  Patient presents today here for physical exam.  Patient explains . Over the last 3 weeks she's made serious diet changes. He is a Engineer, structural and exercises regularly.  He's doing well with Astelin and Nasonex, however his insurance stopped covering Nasonex. He will try Nasacort again.  He has no other complaints.   PMH: Smoking status noted ROS: Per HPI  Objective: BP 130/85 mmHg  Pulse 88  Temp(Src) 97.3 F (36.3 C) (Oral)  Ht _0  (1.651 m)  Wt 239 lb 12.8 oz (108.773 kg)  BMI 39.90 kg/m2 Gen: NAD, alert, cooperative with exam HEENT: NCAT CV: RRR, good S1/S2, no murmur Resp: CTABL, no wheezes, non-labored Abd: SNTND, BS present, no guarding or organomegaly Ext: No edema, warm Neuro: Alert and oriented, 2+ patellar tendon reflexes bilaterally  Assessment and plan:  # annual physical exam Obesity, otherwise normal exam Labs as below Return to clinic in 6 months for discussion about weight, sooner if needed  # obesity Encouraged him, he is me lots of positive choices left lately, 7 pound weight loss in 5 weeks  # vasomotor rhinitis Vasomotor versus allergic Doing well with Astelin andNasonex, changing the Nasacort due to formulary change Consider Q nasal Return to clinic with any concerns    Orders Placed This Encounter  Procedures  . CMP14+EGFR  . TSH  . Lipid Panel  . CBC with Differential    Laroy Apple, MD [facility] 11/25/2015, 10:14 AM

## 2015-11-25 NOTE — Patient Instructions (Signed)
Great to see you!  Keep up the good work!  Try nasocort again, over the counter, we could consider QNasal or trying to get nasonex approved if it is not effective  Lets follow up in 6 months to see how diet and exercise is going, you are always welcome sooner if you need us.   We will call with labs within 1 week

## 2015-11-26 LAB — CBC WITH DIFFERENTIAL/PLATELET
BASOS: 1 %
Basophils Absolute: 0.1 10*3/uL (ref 0.0–0.2)
EOS (ABSOLUTE): 0.3 10*3/uL (ref 0.0–0.4)
EOS: 2 %
HEMATOCRIT: 43.9 % (ref 37.5–51.0)
HEMOGLOBIN: 14.9 g/dL (ref 12.6–17.7)
Immature Grans (Abs): 0 10*3/uL (ref 0.0–0.1)
Immature Granulocytes: 0 %
LYMPHS ABS: 2 10*3/uL (ref 0.7–3.1)
Lymphs: 15 %
MCH: 29.4 pg (ref 26.6–33.0)
MCHC: 33.9 g/dL (ref 31.5–35.7)
MCV: 87 fL (ref 79–97)
MONOCYTES: 6 %
MONOS ABS: 0.8 10*3/uL (ref 0.1–0.9)
NEUTROS ABS: 10 10*3/uL — AB (ref 1.4–7.0)
Neutrophils: 76 %
Platelets: 307 10*3/uL (ref 150–379)
RBC: 5.07 x10E6/uL (ref 4.14–5.80)
RDW: 13.5 % (ref 12.3–15.4)
WBC: 13.1 10*3/uL — ABNORMAL HIGH (ref 3.4–10.8)

## 2015-11-26 LAB — CMP14+EGFR
ALT: 25 IU/L (ref 0–44)
AST: 23 IU/L (ref 0–40)
Albumin/Globulin Ratio: 1.8 (ref 1.2–2.2)
Albumin: 4.5 g/dL (ref 3.5–5.5)
Alkaline Phosphatase: 59 IU/L (ref 39–117)
BILIRUBIN TOTAL: 0.7 mg/dL (ref 0.0–1.2)
BUN/Creatinine Ratio: 16 (ref 8–19)
BUN: 17 mg/dL (ref 6–20)
CALCIUM: 9.5 mg/dL (ref 8.7–10.2)
CO2: 19 mmol/L (ref 18–29)
Chloride: 103 mmol/L (ref 96–106)
Creatinine, Ser: 1.07 mg/dL (ref 0.76–1.27)
GFR calc Af Amer: 103 mL/min/{1.73_m2} (ref >59)
GFR calc non Af Amer: 89 mL/min/{1.73_m2} (ref >59)
GLUCOSE: 82 mg/dL (ref 65–99)
Globulin, Total: 2.5 g/dL (ref 1.5–4.5)
Potassium: 4.7 mmol/L (ref 3.5–5.2)
SODIUM: 140 mmol/L (ref 134–144)
TOTAL PROTEIN: 7 g/dL (ref 6.0–8.5)

## 2015-11-26 LAB — LIPID PANEL
CHOLESTEROL TOTAL: 196 mg/dL (ref 100–199)
Chol/HDL Ratio: 5.4 ratio units — ABNORMAL HIGH (ref 0.0–5.0)
HDL: 36 mg/dL — AB (ref >39)
LDL CALC: 135 mg/dL — AB (ref 0–99)
Triglycerides: 125 mg/dL (ref 0–149)
VLDL CHOLESTEROL CAL: 25 mg/dL (ref 5–40)

## 2015-11-26 LAB — TSH: TSH: 1.5 u[IU]/mL (ref 0.450–4.500)

## 2016-02-03 DIAGNOSIS — J0140 Acute pansinusitis, unspecified: Secondary | ICD-10-CM | POA: Diagnosis not present

## 2016-02-03 DIAGNOSIS — R05 Cough: Secondary | ICD-10-CM | POA: Diagnosis not present

## 2016-02-04 ENCOUNTER — Ambulatory Visit: Payer: BLUE CROSS/BLUE SHIELD | Admitting: Family Medicine

## 2016-02-08 ENCOUNTER — Ambulatory Visit (INDEPENDENT_AMBULATORY_CARE_PROVIDER_SITE_OTHER): Payer: BLUE CROSS/BLUE SHIELD | Admitting: Family Medicine

## 2016-02-08 ENCOUNTER — Encounter: Payer: Self-pay | Admitting: Family Medicine

## 2016-02-08 VITALS — BP 102/66 | HR 78 | Temp 98.1°F | Ht 65.0 in | Wt 245.8 lb

## 2016-02-08 DIAGNOSIS — J4541 Moderate persistent asthma with (acute) exacerbation: Secondary | ICD-10-CM

## 2016-02-08 DIAGNOSIS — J200 Acute bronchitis due to Mycoplasma pneumoniae: Secondary | ICD-10-CM | POA: Diagnosis not present

## 2016-02-08 MED ORDER — GUAIFENESIN-CODEINE 100-10 MG/5ML PO SYRP: 5.0000 mL | ORAL_SOLUTION | ORAL | Status: DC | PRN

## 2016-02-08 MED ORDER — LEVOFLOXACIN 500 MG PO TABS: 500.0000 mg | ORAL_TABLET | Freq: Every day | ORAL | Status: DC

## 2016-02-08 MED ORDER — BETAMETHASONE SOD PHOS & ACET 6 (3-3) MG/ML IJ SUSP
6.0000 mg | Freq: Once | INTRAMUSCULAR | Status: AC
  Administered 2016-02-08: 6 mg via INTRAMUSCULAR

## 2016-02-08 NOTE — Progress Notes (Signed)
Subjective:  Patient ID: Chase Stout, male    DOB: 04-16-1981  Age: 35 y.o. MRN: [id number]  CC: URI   HPI DELANTE KARAPETYAN presents for profuse dry cough, ongoing for 10 days or more. No relief with augmentin since 6-1. No fever, Chills or sweats. Although cough is nonproductive he does feel like there is something trying to come up. He has not been short of breath. He has felt himself wheezing some. He does have a remote history of asthma. He has been using Nasonex and Mucinex along with the Augmentin. Staying active at work.  History Carnel has a past medical history of Sinus infection; Osteoarthritis of left shoulder (06/18/2012); Allergy; and Hydrocele (01/31/1999).   He has past surgical history that includes Nasal sinus surgery; Lithotripsy; and Shoulder hemi-arthroplasty (09/24/2012).   His family history includes Cancer in his father; Hypertension in his brother and mother.He reports that he has never smoked. He does not have any smokeless tobacco history on file. He reports that he drinks about 1.2 oz of alcohol per week. He reports that he does not use illicit drugs.  Current Outpatient Prescriptions on File Prior to Visit  Medication Sig Dispense Refill  . albuterol (PROVENTIL HFA;VENTOLIN HFA) 108 (90 BASE) MCG/ACT inhaler Inhale 2 puffs into the lungs every 6 (six) hours as needed for wheezing or shortness of breath. 1 Inhaler 0  . azelastine (ASTELIN) 0.1 % nasal spray USE 2 SPRAYS IN EACH NOSTRIL TWICE A DAY AS DIRECTED 30 mL 3  . Fluticasone Furoate-Vilanterol (BREO ELLIPTA) 100-25 MCG/INH AEPB Inhale 1 puff into the lungs daily. 1 each 11  . pantoprazole (PROTONIX) 40 MG tablet Take 1 tablet (40 mg total) by mouth daily. (Patient taking differently: Take 40 mg by mouth daily. As needed) 30 tablet 5   No current facility-administered medications on file prior to visit.    ROS Review of Systems  Constitutional: Negative for fever, chills, activity change and appetite  change.  HENT: Positive for congestion. Negative for ear discharge, ear pain, hearing loss, nosebleeds, postnasal drip, rhinorrhea, sinus pressure, sneezing and trouble swallowing.   Respiratory: Positive for cough (rofuse), chest tightness and wheezing. Negative for shortness of breath.   Cardiovascular: Negative for chest pain and palpitations.  Skin: Negative for rash.    Objective:   Physical Exam  Constitutional: He appears well-developed and well-nourished.  HENT:  Head: Normocephalic and atraumatic.  Right Ear: Tympanic membrane and external ear normal. No decreased hearing is noted.  Left Ear: Tympanic membrane and external ear normal. No decreased hearing is noted.  Mouth/Throat: No oropharyngeal exudate or posterior oropharyngeal erythema.  Neck: Normal range of motion. No Brudzinski's sign noted.  Pulmonary/Chest: No stridor. No respiratory distress. He has wheezes (scattered bronchitic in character).  Lymphadenopathy:       Head (right side): No preauricular adenopathy present.       Head (left side): No preauricular adenopathy present.    He has no cervical adenopathy.    Assessment & Plan:   Athanasius was seen today for uri.  Diagnoses and all orders for this visit:  Acute bronchitis due to Mycoplasma pneumoniae -     betamethasone acetate-betamethasone sodium phosphate (CELESTONE) injection 6 mg; Inject 1 mL (6 mg total) into the muscle once.  Asthma, moderate persistent, with acute exacerbation  Other orders -     levofloxacin (LEVAQUIN) 500 MG tablet; Take 1 tablet (500 mg total) by mouth daily. -     guaiFENesin-codeine (CHERATUSSIN AC)  100-10 MG/5ML syrup; Take 5 mLs by mouth every 4 (four) hours as needed for cough.   I have discontinued Mr. Sherren MochaStrader's amoxicillin-clavulanate. I am also having him start on levofloxacin and guaiFENesin-codeine. Additionally, I am having him maintain his azelastine, albuterol, pantoprazole, fluticasone furoate-vilanterol, and  mometasone. We administered betamethasone acetate-betamethasone sodium phosphate.  .   Medication List       This list is accurate as of: 02/08/16  9:09 PM.  Always use your most recent med list.               albuterol 108 (90 Base) MCG/ACT inhaler  Commonly known as:  PROVENTIL HFA;VENTOLIN HFA  Inhale 2 puffs into the lungs every 6 (six) hours as needed for wheezing or shortness of breath.     azelastine 0.1 % nasal spray  Commonly known as:  ASTELIN  USE 2 SPRAYS IN EACH NOSTRIL TWICE A DAY AS DIRECTED     fluticasone furoate-vilanterol 100-25 MCG/INH Aepb  Commonly known as:  BREO ELLIPTA  Inhale 1 puff into the lungs daily.     guaiFENesin-codeine 100-10 MG/5ML syrup  Commonly known as:  CHERATUSSIN AC  Take 5 mLs by mouth every 4 (four) hours as needed for cough.     levofloxacin 500 MG tablet  Commonly known as:  LEVAQUIN  Take 1 tablet (500 mg total) by mouth daily.     mometasone 50 MCG/ACT nasal spray  Commonly known as:  NASONEX  PLACE 2 SPRAYS INTO THE NOSE DAILY.     pantoprazole 40 MG tablet  Commonly known as:  PROTONIX  Take 1 tablet (40 mg total) by mouth daily.          Follow-up: Return if symptoms worsen or fail to improve.  Mechele ClaudeWarren Solace Wendorff, M.D.

## 2016-02-08 NOTE — Patient Instructions (Signed)
Consider using a probiotic of choice to prevent diarrhea

## 2016-03-02 ENCOUNTER — Ambulatory Visit (INDEPENDENT_AMBULATORY_CARE_PROVIDER_SITE_OTHER): Payer: BLUE CROSS/BLUE SHIELD | Admitting: Family Medicine

## 2016-03-02 ENCOUNTER — Encounter: Payer: Self-pay | Admitting: Family Medicine

## 2016-03-02 VITALS — BP 116/78 | HR 72 | Temp 98.1°F | Ht 65.0 in | Wt 244.6 lb

## 2016-03-02 DIAGNOSIS — J4531 Mild persistent asthma with (acute) exacerbation: Secondary | ICD-10-CM

## 2016-03-02 MED ORDER — MOMETASONE FUROATE 50 MCG/ACT NA SUSP: 2.0000 | Freq: Two times a day (BID) | NASAL | Status: DC | PRN

## 2016-03-02 MED ORDER — PREDNISONE 20 MG PO TABS: ORAL_TABLET | ORAL | Status: DC

## 2016-03-02 NOTE — Progress Notes (Signed)
BP 116/78 mmHg  Pulse 72  Temp(Src) 98.1 F (36.7 C) (Oral)  Ht 5\' 5"  (1.651 m)  Wt 244 lb 9.6 oz (110.95 kg)  BMI 40.70 kg/m2   Subjective:    Patient ID: Chase Stout, male    DOB: 03/18/1981, 35 y.o.   MRN: [id number]  HPI: Chase Gamblerhomas S Bonura is a 35 y.o. male presenting on 03/02/2016 for Sinusitis   HPI Sinus congestion and postnasal drainage and cough Patient has been having sinus congestion and postnasal drainage and cough that has been going on for the past couple weeks. He initially took course of Augmentin and then was given a course of Levaquin. He has seen some improvement on those. He denies any fevers or chills or shortness of breath or wheezing. He is still having the sinus congestion and postnasal drainage and coughing up phlegm. He has continued to use his Earlie ServerBreo Ellipta but has not had to use any rescue inhaler to this point. He says his symptoms are worse in the early morning and then lighten up throughout the day but he is still producing phlegm throughout the day. He has been using Astelin nasal spray as well but ran out of his Nasonex.  Relevant past medical, surgical, family and social history reviewed and updated as indicated. Interim medical history since our last visit reviewed. Allergies and medications reviewed and updated.  Review of Systems  Constitutional: Negative for fever and chills.  HENT: Positive for congestion, postnasal drip, rhinorrhea, sinus pressure, sneezing and sore throat. Negative for ear discharge, ear pain and voice change.   Eyes: Negative for pain, discharge, redness and visual disturbance.  Respiratory: Positive for cough. Negative for chest tightness, shortness of breath and wheezing.   Cardiovascular: Negative for chest pain and leg swelling.  Gastrointestinal: Negative for abdominal pain, diarrhea and constipation.  Genitourinary: Negative for difficulty urinating.  Musculoskeletal: Negative for back pain and gait problem.  Skin:  Negative for rash.  Neurological: Negative for syncope, light-headedness and headaches.  All other systems reviewed and are negative.   Per HPI unless specifically indicated above     Medication List       This list is accurate as of: 03/02/16  9:41 AM.  Always use your most recent med list.               albuterol 108 (90 Base) MCG/ACT inhaler  Commonly known as:  PROVENTIL HFA;VENTOLIN HFA  Inhale 2 puffs into the lungs every 6 (six) hours as needed for wheezing or shortness of breath.     azelastine 0.1 % nasal spray  Commonly known as:  ASTELIN  USE 2 SPRAYS IN EACH NOSTRIL TWICE A DAY AS DIRECTED     fluticasone furoate-vilanterol 100-25 MCG/INH Aepb  Commonly known as:  BREO ELLIPTA  Inhale 1 puff into the lungs daily.     guaiFENesin-codeine 100-10 MG/5ML syrup  Commonly known as:  CHERATUSSIN AC  Take 5 mLs by mouth every 4 (four) hours as needed for cough.     mometasone 50 MCG/ACT nasal spray  Commonly known as:  NASONEX  Place 2 sprays into the nose 2 (two) times daily as needed.     pantoprazole 40 MG tablet  Commonly known as:  PROTONIX  Take 1 tablet (40 mg total) by mouth daily.     predniSONE 20 MG tablet  Commonly known as:  DELTASONE  2 po at same time daily for 5 days  Objective:    BP 116/78 mmHg  Pulse 72  Temp(Src) 98.1 F (36.7 C) (Oral)  Ht 5\' 5"  (1.651 m)  Wt 244 lb 9.6 oz (110.95 kg)  BMI 40.70 kg/m2  Wt Readings from Last 3 Encounters:  03/02/16 244 lb 9.6 oz (110.95 kg)  02/08/16 245 lb 12.8 oz (111.494 kg)  11/25/15 239 lb 12.8 oz (108.773 kg)    Physical Exam  Constitutional: He is oriented to person, place, and time. He appears well-developed and well-nourished. No distress.  HENT:  Right Ear: Tympanic membrane, external ear and ear canal normal.  Left Ear: Tympanic membrane, external ear and ear canal normal.  Nose: Mucosal edema and rhinorrhea present. No sinus tenderness. No epistaxis. Right sinus exhibits  maxillary sinus tenderness. Right sinus exhibits no frontal sinus tenderness. Left sinus exhibits maxillary sinus tenderness. Left sinus exhibits no frontal sinus tenderness.  Mouth/Throat: Uvula is midline and mucous membranes are normal. Posterior oropharyngeal edema and posterior oropharyngeal erythema present. No oropharyngeal exudate or tonsillar abscesses.  Eyes: Conjunctivae and EOM are normal. Pupils are equal, round, and reactive to light. Right eye exhibits no discharge. No scleral icterus.  Neck: Neck supple. No thyromegaly present.  Cardiovascular: Normal rate, regular rhythm, normal heart sounds and intact distal pulses.   No murmur heard. Pulmonary/Chest: Effort normal and breath sounds normal. No respiratory distress. He has no wheezes. He has no rales.  Musculoskeletal: Normal range of motion. He exhibits no edema.  Lymphadenopathy:    He has no cervical adenopathy.  Neurological: He is alert and oriented to person, place, and time. Coordination normal.  Skin: Skin is warm and dry. No rash noted. He is not diaphoretic.  Psychiatric: He has a normal mood and affect. His behavior is normal.  Nursing note and vitals reviewed.     Assessment & Plan:       Problem List Items Addressed This Visit    None    Visit Diagnoses    Asthma with acute exacerbation, mild persistent    -  Primary    Relevant Medications    predniSONE (DELTASONE) 20 MG tablet    mometasone (NASONEX) 50 MCG/ACT nasal spray        Follow up plan: Return if symptoms worsen or fail to improve.  Counseling provided for all of the vaccine components No orders of the defined types were placed in this encounter.    Arville CareJoshua Dettinger, MD [facility] 03/02/2016, 9:41 AM

## 2016-03-29 DIAGNOSIS — M19012 Primary osteoarthritis, left shoulder: Secondary | ICD-10-CM | POA: Diagnosis not present

## 2016-04-26 DIAGNOSIS — M19012 Primary osteoarthritis, left shoulder: Secondary | ICD-10-CM | POA: Diagnosis not present

## 2016-05-29 ENCOUNTER — Ambulatory Visit (INDEPENDENT_AMBULATORY_CARE_PROVIDER_SITE_OTHER): Payer: BLUE CROSS/BLUE SHIELD | Admitting: Family Medicine

## 2016-05-29 ENCOUNTER — Encounter: Payer: Self-pay | Admitting: Family Medicine

## 2016-05-29 VITALS — BP 125/84 | HR 66 | Temp 97.8°F | Ht 65.0 in | Wt 244.4 lb

## 2016-05-29 DIAGNOSIS — J4540 Moderate persistent asthma, uncomplicated: Secondary | ICD-10-CM | POA: Diagnosis not present

## 2016-05-29 DIAGNOSIS — G4700 Insomnia, unspecified: Secondary | ICD-10-CM

## 2016-05-29 DIAGNOSIS — J300 Vasomotor rhinitis: Secondary | ICD-10-CM

## 2016-05-29 MED ORDER — TRAZODONE HCL 100 MG PO TABS: 100.0000 mg | ORAL_TABLET | Freq: Every day | ORAL | 3 refills | Status: DC

## 2016-05-29 MED ORDER — AZELASTINE HCL 0.1 % NA SOLN: NASAL | 11 refills | Status: DC

## 2016-05-29 MED ORDER — MONTELUKAST SODIUM 10 MG PO TABS: 10.0000 mg | ORAL_TABLET | Freq: Every day | ORAL | 3 refills | Status: DC

## 2016-05-29 NOTE — Progress Notes (Signed)
   HPI  Patient presents today here for routine follow-up of chronic medical conditions and is sleeping difficulty.  Vasomotor rhinitis Patient has of ear symptoms, helped by Astelin and Nasonex, needs refill Astelin Previously felt to be vasomotor, however does have allergic component.  Insomnia Over the last 4-5 weeks she's had increased stress at work and as a result has lost a lot of sleep. He states that he would like to try something to help him sleep. He does have anxiety, however he does not want to consider anxiety medications at this point.  Asthma Doing well with breo, had exacerbations about 2 months ago.  PMH: Smoking status noted ROS: Per HPI  Objective: BP 125/84   Pulse 66   Temp 97.8 F (36.6 C) (Oral)   Ht 5\' 5"  (1.651 m)   Wt 244 lb 6.4 oz (110.9 kg)   BMI 40.67 kg/m  Gen: NAD, alert, cooperative with exam HEENT: NCAT CV: RRR, good S1/S2, no murmur Resp: CTABL, no wheezes, non-labored Ext: No edema, warm Neuro: Alert and oriented, No gross deficits  Assessment and plan:  # Insomnia Likely secondary to anxiety, offered treating anxiety with an SSRI, which may be a better long-term treatment. However he feels that his anxiety is situational at work and does have a good chance of improving the next 2 months. First try melatonin for sleep, if not improved I have given him a prescription for trazodone Follow-up as needed  # Rhinitis, vasomotor rhinitis Likely multifactorial rhinitis with some improvement with Nasonex and Astelin nasal spray He continues to have problems, refilled Astelin Adding Singulair, this may benefit asthma as well  # Asthma Well controlled currently Had exacerbation over the summer Adding Singulair, which may help his rhinitis is well, defer starting medication for at least 2-3 weeks while he begins his sleep situation settled.   He's taking meloxicam for his orthopedic surgeon for left shoulder pain. Status post left shoulder  replacement  Meds ordered this encounter  Medications  . meloxicam (MOBIC) 15 MG tablet    Sig: TAKE 1 TABLET DAILY WITH FOOD FOR 2 WEEKS,THEN AS NEEDED    Refill:  1  . traZODone (DESYREL) 100 MG tablet    Sig: Take 1 tablet (100 mg total) by mouth at bedtime.    Dispense:  30 tablet    Refill:  3  . azelastine (ASTELIN) 0.1 % nasal spray    Sig: USE 2 SPRAYS IN EACH NOSTRIL TWICE A DAY AS DIRECTED    Dispense:  30 mL    Refill:  11  . montelukast (SINGULAIR) 10 MG tablet    Sig: Take 1 tablet (10 mg total) by mouth at bedtime.    Dispense:  30 tablet    Refill:  3    Murtis SinkSam Bradshaw, MD [facility] 05/29/2016, 10:00 AM

## 2016-05-29 NOTE — Patient Instructions (Addendum)
Great to see you!  Try melatonin over the counter 5 mg about 30 minutes before bed, if that doesn't work try trazodone  Start singulair in a couple of weeks after we get your sleep straightened out, this is for asthma and likely will help with your noses as well.

## 2016-06-06 DIAGNOSIS — H10412 Chronic giant papillary conjunctivitis, left eye: Secondary | ICD-10-CM | POA: Diagnosis not present

## 2016-07-02 ENCOUNTER — Other Ambulatory Visit: Payer: Self-pay | Admitting: Pediatrics

## 2016-08-10 ENCOUNTER — Encounter: Payer: Self-pay | Admitting: Family Medicine

## 2016-08-10 ENCOUNTER — Ambulatory Visit (INDEPENDENT_AMBULATORY_CARE_PROVIDER_SITE_OTHER): Payer: BLUE CROSS/BLUE SHIELD | Admitting: Family Medicine

## 2016-08-10 VITALS — BP 110/78 | HR 85 | Temp 98.5°F | Ht 65.0 in | Wt 242.2 lb

## 2016-08-10 DIAGNOSIS — J040 Acute laryngitis: Secondary | ICD-10-CM

## 2016-08-10 MED ORDER — AZITHROMYCIN 250 MG PO TABS: ORAL_TABLET | ORAL | 0 refills | Status: DC

## 2016-08-10 NOTE — Progress Notes (Signed)
BP 110/78   Pulse 85   Temp 98.5 F (36.9 C) (Oral)   Ht 5\' 5"  (1.651 m)   Wt 242 lb 4 oz (109.9 kg)   BMI 40.31 kg/m    Subjective:    Patient ID: Chase Stout, male    DOB: 08/27/1981, 35 y.o.   MRN: [id number]  HPI: Chase Gamblerhomas S Dungan is a 35 y.o. male presenting on 08/10/2016 for Sore Throat (patient began getting sick over the weekend, works as a Emergency planning/management officerpolice officer.  Has had a shoulder replacement and takes Amoxicillin before dental procedures so he had some at home and has taken 4 this week.); Sinusitis (sinus congestion and drainage, glands feel swollen in neck); and Cough, chest congestion   HPI Sore throat Sore throat for the past week. Patient had an episode of stomach virus that caused him to vomit multiple times about 1 week ago which lasted less than a day and then was gone but since then he has had significant irritation in his throat and losing his voice. He works as a Emergency planning/management officerpolice officer so he has to talk and yelled frequently and he's been consistently losing his voice and having more and more pain in his throat. He denies any fevers or chills or shortness of breath or wheezing. He denies any sick contacts that he knows of. He denies any fevers or chills.  Relevant past medical, surgical, family and social history reviewed and updated as indicated. Interim medical history since our last visit reviewed. Allergies and medications reviewed and updated.  Review of Systems  Constitutional: Negative for chills and fever.  HENT: Positive for congestion, postnasal drip, rhinorrhea, sinus pressure and sore throat. Negative for ear discharge, ear pain, sneezing and voice change.   Eyes: Negative for pain, discharge, redness and visual disturbance.  Respiratory: Positive for cough. Negative for shortness of breath and wheezing.   Cardiovascular: Negative for chest pain and leg swelling.  Musculoskeletal: Negative for gait problem.  Skin: Negative for rash.  All other systems reviewed  and are negative.   Per HPI unless specifically indicated above     Objective:    BP 110/78   Pulse 85   Temp 98.5 F (36.9 C) (Oral)   Ht 5\' 5"  (1.651 m)   Wt 242 lb 4 oz (109.9 kg)   BMI 40.31 kg/m   Wt Readings from Last 3 Encounters:  08/10/16 242 lb 4 oz (109.9 kg)  05/29/16 244 lb 6.4 oz (110.9 kg)  03/02/16 244 lb 9.6 oz (110.9 kg)    Physical Exam  Constitutional: He is oriented to person, place, and time. He appears well-developed and well-nourished. No distress.  HENT:  Right Ear: Tympanic membrane, external ear and ear canal normal.  Left Ear: Tympanic membrane, external ear and ear canal normal.  Nose: Mucosal edema and rhinorrhea present. No sinus tenderness. No epistaxis. Right sinus exhibits maxillary sinus tenderness. Right sinus exhibits no frontal sinus tenderness. Left sinus exhibits maxillary sinus tenderness. Left sinus exhibits no frontal sinus tenderness.  Mouth/Throat: Uvula is midline and mucous membranes are normal. Posterior oropharyngeal edema and posterior oropharyngeal erythema present. No oropharyngeal exudate or tonsillar abscesses.  Eyes: Conjunctivae are normal. Right eye exhibits no discharge. Left eye exhibits no discharge. No scleral icterus.  Neck: Neck supple. No thyromegaly present.  Cardiovascular: Normal rate, regular rhythm, normal heart sounds and intact distal pulses.   No murmur heard. Pulmonary/Chest: Effort normal and breath sounds normal. No respiratory distress. He has no wheezes.  He has no rales.  Musculoskeletal: Normal range of motion. He exhibits no edema.  Lymphadenopathy:    He has no cervical adenopathy.  Neurological: He is alert and oriented to person, place, and time. Coordination normal.  Skin: Skin is warm and dry. No rash noted. He is not diaphoretic.  Psychiatric: He has a normal mood and affect. His behavior is normal.  Nursing note and vitals reviewed.     Assessment & Plan:   Problem List Items Addressed  This Visit    None    Visit Diagnoses    Laryngitis, acute    -  Primary   Recommend starting his Protonix again, also anti-inflammatory, will send azithromycin   Relevant Medications   azithromycin (ZITHROMAX) 250 MG tablet      Follow up plan: Return if symptoms worsen or fail to improve.  Counseling provided for all of the vaccine components No orders of the defined types were placed in this encounter.   Arville CareJoshua Siraj Dermody, MD [facility] 08/10/2016, 9:38 AM

## 2016-08-21 ENCOUNTER — Encounter: Payer: Self-pay | Admitting: Family Medicine

## 2016-08-21 ENCOUNTER — Ambulatory Visit (INDEPENDENT_AMBULATORY_CARE_PROVIDER_SITE_OTHER): Payer: BLUE CROSS/BLUE SHIELD | Admitting: Family Medicine

## 2016-08-21 ENCOUNTER — Other Ambulatory Visit: Payer: Self-pay | Admitting: Family Medicine

## 2016-08-21 VITALS — BP 110/68 | HR 77 | Temp 98.0°F | Ht 65.0 in | Wt 240.0 lb

## 2016-08-21 DIAGNOSIS — J329 Chronic sinusitis, unspecified: Secondary | ICD-10-CM | POA: Diagnosis not present

## 2016-08-21 DIAGNOSIS — J40 Bronchitis, not specified as acute or chronic: Secondary | ICD-10-CM | POA: Diagnosis not present

## 2016-08-21 MED ORDER — HYDROCODONE-HOMATROPINE 5-1.5 MG/5ML PO SYRP: 5.0000 mL | ORAL_SOLUTION | Freq: Four times a day (QID) | ORAL | 0 refills | Status: DC | PRN

## 2016-08-21 MED ORDER — BETAMETHASONE SOD PHOS & ACET 6 (3-3) MG/ML IJ SUSP
6.0000 mg | Freq: Once | INTRAMUSCULAR | Status: AC
  Administered 2016-08-21: 6 mg via INTRAMUSCULAR

## 2016-08-21 MED ORDER — AMOXICILLIN-POT CLAVULANATE 875-125 MG PO TABS: 1.0000 | ORAL_TABLET | Freq: Two times a day (BID) | ORAL | 0 refills | Status: DC

## 2016-08-21 NOTE — Progress Notes (Signed)
Subjective:  Patient ID: Chase Stout, male    DOB: 02/16/1981  Age: 35 y.o. MRN: [id number]  CC: Cough (pt here today c/o continued cough after completing Zpak and now feels it is down in his chest)   HPI Chase Stout for Symptoms include congestion, facial pain, nasal congestion, no  fever, non productive cough, post nasal drip and sinus pressure with no fever, chills, night sweats or weight loss. Onset of symptoms was a few days ago, gradually worsening since that time. Pt.is drinking moderate amounts of fluids.     History Chase Stout has a past medical history of Allergy; Hydrocele (01/31/1999); Osteoarthritis of left shoulder (06/18/2012); and Sinus infection.   He has a past surgical history that includes Nasal sinus surgery; Lithotripsy; and Shoulder hemi-arthroplasty (09/24/2012).   His family history includes Cancer in his father; Hypertension in his brother and mother.He reports that he has never smoked. He has never used smokeless tobacco. He reports that he drinks about 1.2 oz of alcohol per week . He reports that he does not use drugs.  Current Outpatient Prescriptions on File Prior to Visit  Medication Sig Dispense Refill  . albuterol (PROVENTIL HFA;VENTOLIN HFA) 108 (90 BASE) MCG/ACT inhaler Inhale 2 puffs into the lungs every 6 (six) hours as needed for wheezing or shortness of breath. 1 Inhaler 0  . azelastine (ASTELIN) 0.1 % nasal spray USE 2 SPRAYS IN EACH NOSTRIL TWICE A DAY AS DIRECTED 30 mL 11  . BREO ELLIPTA 100-25 MCG/INH AEPB INHALE 1 PUFF INTO THE LUNGS DAILY. 60 each 2  . meloxicam (MOBIC) 15 MG tablet TAKE 1 TABLET DAILY WITH FOOD FOR 2 WEEKS,THEN AS NEEDED  1  . mometasone (NASONEX) 50 MCG/ACT nasal spray Place 2 sprays into the nose 2 (two) times daily as needed. 17 g 10  . montelukast (SINGULAIR) 10 MG tablet Take 1 tablet (10 mg total) by mouth at bedtime. 30 tablet 3  . pantoprazole (PROTONIX) 40 MG tablet TAKE 1 TABLET BY MOUTH DAILY 30 tablet 1   . traZODone (DESYREL) 100 MG tablet Take 1 tablet (100 mg total) by mouth at bedtime. 30 tablet 3   No current facility-administered medications on file prior to visit.     ROS Review of Systems  Constitutional: Negative for activity change, appetite change, chills and fever.  HENT: Positive for congestion, postnasal drip, rhinorrhea and sinus pressure. Negative for ear discharge, ear pain, hearing loss, nosebleeds, sneezing and trouble swallowing.   Respiratory: Negative for chest tightness and shortness of breath.   Cardiovascular: Negative for chest pain and palpitations.  Skin: Negative for rash.    Objective:  BP 110/68   Pulse 77   Temp 98 F (36.7 C) (Oral)   Ht 5\' 5"  (1.651 m)   Wt 240 lb (108.9 kg)   BMI 39.94 kg/m   Physical Exam  Constitutional: He appears well-developed and well-nourished.  HENT:  Head: Normocephalic and atraumatic.  Right Ear: Tympanic membrane and external ear normal. No decreased hearing is noted.  Left Ear: Tympanic membrane and external ear normal. No decreased hearing is noted.  Nose: Mucosal edema present. Right sinus exhibits no frontal sinus tenderness. Left sinus exhibits no frontal sinus tenderness.  Mouth/Throat: No oropharyngeal exudate or posterior oropharyngeal erythema.  Neck: No Brudzinski's sign noted.  Pulmonary/Chest: Breath sounds normal. No respiratory distress.  Lymphadenopathy:       Head (right side): No preauricular adenopathy present.       Head (left side):  No preauricular adenopathy present.       Right cervical: No superficial cervical adenopathy present.      Left cervical: No superficial cervical adenopathy present.    Assessment & Plan:   Chase Stout was seen today for cough.  Diagnoses and all orders for this visit:  Sinobronchitis -     betamethasone acetate-betamethasone sodium phosphate (CELESTONE) injection 6 mg; Inject 1 mL (6 mg total) into the muscle once.  Other orders -     amoxicillin-clavulanate  (AUGMENTIN) 875-125 MG tablet; Take 1 tablet by mouth 2 (two) times daily. Take all of this medication -     HYDROcodone-homatropine (HYCODAN) 5-1.5 MG/5ML syrup; Take 5 mLs by mouth every 6 (six) hours as needed for cough.   I have discontinued Mr. Sherren MochaStrader's azithromycin. I am also having him start on amoxicillin-clavulanate and HYDROcodone-homatropine. Additionally, I am having him maintain his albuterol, mometasone, meloxicam, traZODone, azelastine, montelukast, pantoprazole, and BREO ELLIPTA. We will continue to administer betamethasone acetate-betamethasone sodium phosphate.  Meds ordered this encounter  Medications  . amoxicillin-clavulanate (AUGMENTIN) 875-125 MG tablet    Sig: Take 1 tablet by mouth 2 (two) times daily. Take all of this medication    Dispense:  20 tablet    Refill:  0  . HYDROcodone-homatropine (HYCODAN) 5-1.5 MG/5ML syrup    Sig: Take 5 mLs by mouth every 6 (six) hours as needed for cough.    Dispense:  120 mL    Refill:  0  . betamethasone acetate-betamethasone sodium phosphate (CELESTONE) injection 6 mg     Follow-up: Return if symptoms worsen or fail to improve.  Mechele ClaudeWarren Priscillia Fouch, M.D.

## 2016-08-25 ENCOUNTER — Ambulatory Visit (INDEPENDENT_AMBULATORY_CARE_PROVIDER_SITE_OTHER): Payer: BLUE CROSS/BLUE SHIELD

## 2016-08-25 ENCOUNTER — Inpatient Hospital Stay: Admission: RE | Admit: 2016-08-25 | Payer: Self-pay | Source: Ambulatory Visit

## 2016-08-25 ENCOUNTER — Ambulatory Visit (INDEPENDENT_AMBULATORY_CARE_PROVIDER_SITE_OTHER): Payer: BLUE CROSS/BLUE SHIELD | Admitting: Family Medicine

## 2016-08-25 ENCOUNTER — Encounter: Payer: Self-pay | Admitting: Family Medicine

## 2016-08-25 VITALS — BP 116/78 | HR 86 | Temp 97.2°F | Ht 65.0 in | Wt 240.0 lb

## 2016-08-25 DIAGNOSIS — J40 Bronchitis, not specified as acute or chronic: Secondary | ICD-10-CM

## 2016-08-25 DIAGNOSIS — J329 Chronic sinusitis, unspecified: Secondary | ICD-10-CM | POA: Diagnosis not present

## 2016-08-25 MED ORDER — BENZONATATE 200 MG PO CAPS: 200.0000 mg | ORAL_CAPSULE | Freq: Three times a day (TID) | ORAL | 0 refills | Status: DC | PRN

## 2016-08-25 MED ORDER — PREDNISONE 10 MG PO TABS: ORAL_TABLET | ORAL | 0 refills | Status: DC

## 2016-08-25 NOTE — Progress Notes (Signed)
Subjective:  Patient ID: Chase Stout, male    DOB: 08/10/1981  Age: 35 y.o. MRN: [id number]  CC: Cough (pt here today and says he is feeling worse and not getting any better)   HPI Chase Stout presents for Worsening of his bronchitis and sinus symptoms. He has been taking the Augmentin and had Celestone shot. He continues to have cough and purulent rhinorrhea. No fever chills or sweats.   History Chase Stout has a past medical history of Allergy; Hydrocele (01/31/1999); Osteoarthritis of left shoulder (06/18/2012); and Sinus infection.   He has a past surgical history that includes Nasal sinus surgery; Lithotripsy; and Shoulder hemi-arthroplasty (09/24/2012).   His family history includes Cancer in his father; Hypertension in his brother and mother.He reports that he has never smoked. He has never used smokeless tobacco. He reports that he drinks about 1.2 oz of alcohol per week . He reports that he does not use drugs.    ROS Review of Systems  Constitutional: Negative for activity change, appetite change, chills and fever.  HENT: Positive for congestion, postnasal drip, rhinorrhea and sinus pressure. Negative for ear discharge, ear pain, hearing loss, nosebleeds, sneezing and trouble swallowing.   Respiratory: Negative for chest tightness and shortness of breath.   Cardiovascular: Negative for chest pain and palpitations.  Skin: Negative for rash.    Objective:  BP 116/78   Pulse 86   Temp 97.2 F (36.2 C) (Oral)   Ht 5\' 5"  (1.651 m)   Wt 240 lb (108.9 kg)   BMI 39.94 kg/m   BP Readings from Last 3 Encounters:  08/25/16 116/78  08/21/16 110/68  08/10/16 110/78    Wt Readings from Last 3 Encounters:  08/25/16 240 lb (108.9 kg)  08/21/16 240 lb (108.9 kg)  08/10/16 242 lb 4 oz (109.9 kg)     Physical Exam  Constitutional: He appears well-developed and well-nourished.  HENT:  Head: Normocephalic and atraumatic.  Right Ear: Tympanic membrane and external ear  normal. No decreased hearing is noted.  Left Ear: Tympanic membrane and external ear normal. No decreased hearing is noted.  Nose: Mucosal edema present. Right sinus exhibits no frontal sinus tenderness. Left sinus exhibits no frontal sinus tenderness.  Mouth/Throat: No oropharyngeal exudate or posterior oropharyngeal erythema.  Neck: No Brudzinski's sign noted.  Pulmonary/Chest: Breath sounds normal. No respiratory distress.  Lymphadenopathy:       Head (right side): No preauricular adenopathy present.       Head (left side): No preauricular adenopathy present.       Right cervical: No superficial cervical adenopathy present.      Left cervical: No superficial cervical adenopathy present.     Lab Results  Component Value Date   WBC 13.1 (H) 11/25/2015   HGB 12.3 (L) 09/25/2012   HCT 43.9 11/25/2015   PLT 307 11/25/2015   GLUCOSE 82 11/25/2015   CHOL 196 11/25/2015   TRIG 125 11/25/2015   HDL 36 (L) 11/25/2015   LDLCALC 135 (H) 11/25/2015   ALT 25 11/25/2015   AST 23 11/25/2015   NA 140 11/25/2015   K 4.7 11/25/2015   CL 103 11/25/2015   CREATININE 1.07 11/25/2015   BUN 17 11/25/2015   CO2 19 11/25/2015   TSH 1.500 11/25/2015    No results found.  Assessment & Plan:   Chase Stout was seen today for cough.  Diagnoses and all orders for this visit:  Sinobronchitis -     DG Chest 2 View; Future -  DG Chest 2 View; Future  Other orders -     benzonatate (TESSALON) 200 MG capsule; Take 1 capsule (200 mg total) by mouth 3 (three) times daily as needed for cough. -     predniSONE (DELTASONE) 10 MG tablet; Take 5 daily for 2 days followed by 4,3,2 and 1 for 2 days each.    I am having [name] start on benzonatate and predniSONE. I am also having him maintain his albuterol, mometasone, meloxicam, traZODone, azelastine, montelukast, pantoprazole, BREO ELLIPTA, amoxicillin-clavulanate, and HYDROcodone-homatropine.  Meds ordered this encounter  Medications  .  benzonatate (TESSALON) 200 MG capsule    Sig: Take 1 capsule (200 mg total) by mouth 3 (three) times daily as needed for cough.    Dispense:  20 capsule    Refill:  0  . predniSONE (DELTASONE) 10 MG tablet    Sig: Take 5 daily for 2 days followed by 4,3,2 and 1 for 2 days each.    Dispense:  30 tablet    Refill:  0     Follow-up: Return if symptoms worsen or fail to improve.  Mechele ClaudeWarren Marycatherine Maniscalco, M.D.

## 2016-08-29 ENCOUNTER — Ambulatory Visit (INDEPENDENT_AMBULATORY_CARE_PROVIDER_SITE_OTHER): Payer: BLUE CROSS/BLUE SHIELD | Admitting: Pediatrics

## 2016-08-29 ENCOUNTER — Encounter: Payer: Self-pay | Admitting: Pediatrics

## 2016-08-29 ENCOUNTER — Telehealth: Payer: Self-pay | Admitting: Family Medicine

## 2016-08-29 VITALS — BP 118/74 | HR 84 | Temp 97.9°F | Ht 65.0 in | Wt 240.0 lb

## 2016-08-29 DIAGNOSIS — J069 Acute upper respiratory infection, unspecified: Secondary | ICD-10-CM

## 2016-08-29 DIAGNOSIS — J019 Acute sinusitis, unspecified: Secondary | ICD-10-CM | POA: Diagnosis not present

## 2016-08-29 MED ORDER — LEVOFLOXACIN 500 MG PO TABS: 500.0000 mg | ORAL_TABLET | Freq: Every day | ORAL | 0 refills | Status: DC

## 2016-08-29 NOTE — Telephone Encounter (Signed)
Pt aware.

## 2016-08-29 NOTE — Telephone Encounter (Signed)
If his breathing is getting worse he needs to be seen to make sure his oxygen levels are fine, may also need longer dose of steroids if asthma is getting worse. Augmentin is a  strong antibiotic that covers sinusitis, ear infections and pneumonia, he should continue to take it. Would need to be seen for exam to switch antibiotics.   If ears and sinuses are hurting should also do sinus rinses with neti pot or similar 2-3 times a day, flonase nasal spray, antihistamine such as cetirizine daily, ibuprofen.

## 2016-08-29 NOTE — Telephone Encounter (Signed)
Albuterol three times a day as well if coughing more, but again, would need to be seen if getting worse

## 2016-08-29 NOTE — Telephone Encounter (Signed)
Pt has seen dettinger x 1 and stacks x 2  - pt states: Cough - rally bad Hoarse Sinus drainage - facial pressure - ear pain Tired  No fever in the last few days  Still on augmentin, tessalon, syrup and prednisone He feels worse today than he did on Friday  He states levaquin has worked really well in the past - could wee change him?  Dr Oswaldo DoneVincent to address (for stacks)

## 2016-08-29 NOTE — Progress Notes (Signed)
  Subjective:   Patient ID: Chase Stout, male    DOB: 01/05/1981, 35 y.o.   MRN: [id number] CC: URI  HPI: Chase Stout is a 35 y.o. male presenting for URI  Continues to have congestion, pressure in sinuses, ears Has a long history of sinus problems, h/o asthma Has not been able to use albuterol, causes throat to burn Using both astelin, nasocort, both those now making inside of nares b/l burn too Cough is dry No fevers Outside a lot for work now  Relevant past medical, surgical, family and social history reviewed. Allergies and medications reviewed and updated. History  Smoking Status  . Never Smoker  Smokeless Tobacco  . Never Used   ROS: Per HPI   Objective:    BP 118/74   Pulse 84   Temp 97.9 F (36.6 C) (Oral)   Ht 5\' 5"  (1.651 m)   Wt 240 lb (108.9 kg)   BMI 39.94 kg/m   Wt Readings from Last 3 Encounters:  08/29/16 240 lb (108.9 kg)  08/25/16 240 lb (108.9 kg)  08/21/16 240 lb (108.9 kg)    Gen: NAD, alert, cooperative with exam, NCAT EYES: EOMI, no conjunctival injection, or no icterus ENT:  TMs dull pink b/l, OP with mild erythema, pressure over max sinuses b/l, frontal sinuses LYMPH: no cervical LAD CV: NRRR, normal S1/S2, no murmur Resp: CTABL, no wheezes, normal WOB, dry cough present Ext: No edema, warm Neuro: Alert and oriented, strength equal b/l UE and LE, coordination grossly normal MSK: normal muscle bulk  Assessment & Plan:  Chase Stout was seen today for uri.  Diagnoses and all orders for this visit:  Acute URi Ongoing symptoms past 9 days No fevers Appetite fine Already on augmentin Discussed with pt expect he should start to improve over next few days Start sinus rinses PO antihistamine Ok to hold nasal sprays with irriation and burning with use now Gave paper Rx for below, pt feeling worse today  Cont symptomatic care as above, if no improvement in next few days start below for recurrent sinusitis  Acute sinusitis, recurrence  not specified, unspecified location -     levofloxacin (LEVAQUIN) 500 MG tablet; Take 1 tablet (500 mg total) by mouth daily.   Follow up plan: As needed Chase Krasarol Decklan Mau, MD [facility]

## 2016-09-16 ENCOUNTER — Other Ambulatory Visit: Payer: Self-pay | Admitting: Family Medicine

## 2016-09-20 ENCOUNTER — Other Ambulatory Visit: Payer: Self-pay | Admitting: Family Medicine

## 2016-10-06 ENCOUNTER — Ambulatory Visit (INDEPENDENT_AMBULATORY_CARE_PROVIDER_SITE_OTHER): Payer: BLUE CROSS/BLUE SHIELD | Admitting: Family Medicine

## 2016-10-06 ENCOUNTER — Encounter: Payer: Self-pay | Admitting: Family Medicine

## 2016-10-06 VITALS — BP 126/85 | HR 75 | Temp 98.6°F | Ht 65.0 in | Wt 245.2 lb

## 2016-10-06 DIAGNOSIS — R599 Enlarged lymph nodes, unspecified: Secondary | ICD-10-CM

## 2016-10-06 DIAGNOSIS — R221 Localized swelling, mass and lump, neck: Secondary | ICD-10-CM | POA: Diagnosis not present

## 2016-10-06 DIAGNOSIS — R490 Dysphonia: Secondary | ICD-10-CM | POA: Diagnosis not present

## 2016-10-06 MED ORDER — AMOXICILLIN 500 MG PO CAPS: 500.0000 mg | ORAL_CAPSULE | Freq: Three times a day (TID) | ORAL | 0 refills | Status: DC

## 2016-10-06 MED ORDER — PREDNISONE 20 MG PO TABS: ORAL_TABLET | ORAL | 0 refills | Status: DC

## 2016-10-06 NOTE — Progress Notes (Signed)
   HPI  Patient presents today with hoarseness.  Patient explains that for the last 10 weeks she's had persistent off and on hoarseness. He's been treated with Augmentin, Levaquin, steroids, Tessalon Perles with no improvement.  He has frequent throat clearing, at times has difficulty swallowing and feels like he actually has to pull the skin of his throat for as he swallows.  He has intermittent night sweats, this is been going on for months.  He denies any difficulty tolerating liquids. He denies any shortness of breath, cough, fevers, chills, or malaise. The most irritating pain is the throat clearing and hoarseness.  Oftentimes he has a difficult time talking at all.  PMH: Smoking status noted ROS: Per HPI  Objective: BP 126/85   Pulse 75   Temp 98.6 F (37 C) (Oral)   Ht 5' 5" (1.651 m)   Wt 245 lb 3.2 oz (111.2 kg)   BMI 40.80 kg/m  Gen: NAD, alert, cooperative with exam HEENT: NCAT, pharynx moist and clear, nares with mild swelling of the turbinates bilaterally, TMs normal bilaterally Neck: Anterior cervical chain with one enlarged lymph node about the area of the trachea, mild tenderness to palpation, mobile and soft CV: RRR, good S1/S2, no murmur Resp: CTABL, no wheezes, non-labored Ext: No edema, warm Neuro: Alert and oriented, No gross deficits  Assessment and plan:  # Neck fullness, lymph node enlargement, hoarseness Considering chronicity of symptoms and night sweats I'm concerned about serious etiology like lymphoma  He had very mild improvement with prednisone and antibiotics, we've tried another course of this today.  Patient states that the size of the likely lymph node has been fluctuating, at times is so large it casted shadow in his neck.     Orders Placed This Encounter  Procedures  . CT Soft Tissue Neck W Contrast    Standing Status:   Future    Standing Expiration Date:   01/03/2018    Order Specific Question:   If indicated for the ordered  procedure, I authorize the administration of contrast media per Radiology protocol    Answer:   Yes    Order Specific Question:   Reason for Exam (SYMPTOM  OR DIAGNOSIS REQUIRED)    Answer:   persistent L sided anterior cervical  LN, Hoarseness, throat fullness, eval for lymphoma    Order Specific Question:   Preferred imaging location?    Answer:    Hospital  . CBC with Differential  . CMP14+EGFR    Meds ordered this encounter  Medications  . amoxicillin (AMOXIL) 500 MG capsule    Sig: Take 1 capsule (500 mg total) by mouth 3 (three) times daily.    Dispense:  30 capsule    Refill:  0  . predniSONE (DELTASONE) 20 MG tablet    Sig: 2 po at sametime daily for 5 days- start tomorrow    Dispense:  10 tablet    Refill:  0    Sam Bradshaw, MD [facility] 10/06/2016, 5:15 PM     

## 2016-10-06 NOTE — Patient Instructions (Signed)
Great to see you!   

## 2016-10-07 LAB — CMP14+EGFR
A/G RATIO: 2 (ref 1.2–2.2)
ALK PHOS: 59 IU/L (ref 39–117)
ALT: 21 IU/L (ref 0–44)
AST: 17 IU/L (ref 0–40)
Albumin: 4.3 g/dL (ref 3.5–5.5)
BILIRUBIN TOTAL: 0.3 mg/dL (ref 0.0–1.2)
BUN/Creatinine Ratio: 12 (ref 9–20)
BUN: 12 mg/dL (ref 6–20)
CHLORIDE: 104 mmol/L (ref 96–106)
CO2: 22 mmol/L (ref 18–29)
Calcium: 9.2 mg/dL (ref 8.7–10.2)
Creatinine, Ser: 0.99 mg/dL (ref 0.76–1.27)
GFR calc Af Amer: 112 mL/min/{1.73_m2} (ref >59)
GFR calc non Af Amer: 97 mL/min/{1.73_m2} (ref >59)
Globulin, Total: 2.2 g/dL (ref 1.5–4.5)
Glucose: 87 mg/dL (ref 65–99)
POTASSIUM: 4.2 mmol/L (ref 3.5–5.2)
Sodium: 142 mmol/L (ref 134–144)
Total Protein: 6.5 g/dL (ref 6.0–8.5)

## 2016-10-07 LAB — CBC WITH DIFFERENTIAL/PLATELET
Basophils Absolute: 0.1 10*3/uL (ref 0.0–0.2)
Basos: 1 %
EOS (ABSOLUTE): 0.2 10*3/uL (ref 0.0–0.4)
Eos: 2 %
Hematocrit: 42.1 % (ref 37.5–51.0)
Hemoglobin: 14.1 g/dL (ref 13.0–17.7)
Immature Grans (Abs): 0.1 10*3/uL (ref 0.0–0.1)
Immature Granulocytes: 1 %
Lymphocytes Absolute: 2.5 10*3/uL (ref 0.7–3.1)
Lymphs: 22 %
MCH: 29.1 pg (ref 26.6–33.0)
MCHC: 33.5 g/dL (ref 31.5–35.7)
MCV: 87 fL (ref 79–97)
Monocytes Absolute: 0.9 10*3/uL (ref 0.1–0.9)
Monocytes: 7 %
Neutrophils Absolute: 7.9 10*3/uL — ABNORMAL HIGH (ref 1.4–7.0)
Neutrophils: 67 %
Platelets: 290 10*3/uL (ref 150–379)
RBC: 4.85 x10E6/uL (ref 4.14–5.80)
RDW: 13 % (ref 12.3–15.4)
WBC: 11.7 10*3/uL — ABNORMAL HIGH (ref 3.4–10.8)

## 2016-10-08 ENCOUNTER — Other Ambulatory Visit: Payer: Self-pay | Admitting: Family Medicine

## 2016-10-13 ENCOUNTER — Ambulatory Visit (HOSPITAL_COMMUNITY)
Admission: RE | Admit: 2016-10-13 | Discharge: 2016-10-13 | Disposition: A | Discharge status: Home / Self Care | Payer: BLUE CROSS/BLUE SHIELD | Source: Ambulatory Visit | Attending: Family Medicine | Admitting: Family Medicine

## 2016-10-13 DIAGNOSIS — R221 Localized swelling, mass and lump, neck: Secondary | ICD-10-CM | POA: Insufficient documentation

## 2016-10-13 DIAGNOSIS — C8590 Non-Hodgkin lymphoma, unspecified, unspecified site: Secondary | ICD-10-CM | POA: Diagnosis not present

## 2016-10-13 DIAGNOSIS — R490 Dysphonia: Secondary | ICD-10-CM | POA: Diagnosis not present

## 2016-10-13 DIAGNOSIS — R599 Enlarged lymph nodes, unspecified: Secondary | ICD-10-CM

## 2016-10-13 MED ORDER — IOPAMIDOL (ISOVUE-300) INJECTION 61%
75.0000 mL | Freq: Once | INTRAVENOUS | Status: AC | PRN
  Administered 2016-10-13: 75 mL via INTRAVENOUS

## 2016-10-26 ENCOUNTER — Telehealth: Payer: Self-pay | Admitting: Family Medicine

## 2016-10-26 DIAGNOSIS — R490 Dysphonia: Secondary | ICD-10-CM

## 2016-10-26 NOTE — Telephone Encounter (Signed)
Patient aware.

## 2016-10-26 NOTE — Telephone Encounter (Signed)
Patient is willing to see ENT, please advise.

## 2016-10-26 NOTE — Telephone Encounter (Signed)
Referral written.   Murtis SinkSam Lizbeth Feijoo, MD [facility] 10/26/2016, 5:40 PM

## 2016-11-16 ENCOUNTER — Ambulatory Visit (INDEPENDENT_AMBULATORY_CARE_PROVIDER_SITE_OTHER): Payer: BLUE CROSS/BLUE SHIELD | Admitting: Otolaryngology

## 2016-11-16 DIAGNOSIS — R490 Dysphonia: Secondary | ICD-10-CM | POA: Diagnosis not present

## 2016-11-16 DIAGNOSIS — K219 Gastro-esophageal reflux disease without esophagitis: Secondary | ICD-10-CM

## 2016-11-25 ENCOUNTER — Other Ambulatory Visit: Payer: Self-pay | Admitting: Family Medicine

## 2016-12-14 ENCOUNTER — Ambulatory Visit (INDEPENDENT_AMBULATORY_CARE_PROVIDER_SITE_OTHER): Payer: BLUE CROSS/BLUE SHIELD | Admitting: Otolaryngology

## 2016-12-18 ENCOUNTER — Ambulatory Visit (INDEPENDENT_AMBULATORY_CARE_PROVIDER_SITE_OTHER): Payer: BLUE CROSS/BLUE SHIELD | Admitting: Otolaryngology

## 2016-12-18 DIAGNOSIS — R490 Dysphonia: Secondary | ICD-10-CM | POA: Diagnosis not present

## 2016-12-18 DIAGNOSIS — K219 Gastro-esophageal reflux disease without esophagitis: Secondary | ICD-10-CM | POA: Diagnosis not present

## 2016-12-27 ENCOUNTER — Other Ambulatory Visit: Payer: Self-pay | Admitting: Family Medicine

## 2017-01-12 ENCOUNTER — Encounter: Payer: Self-pay | Admitting: Nurse Practitioner

## 2017-01-12 ENCOUNTER — Ambulatory Visit (INDEPENDENT_AMBULATORY_CARE_PROVIDER_SITE_OTHER): Payer: BLUE CROSS/BLUE SHIELD | Admitting: Nurse Practitioner

## 2017-01-12 VITALS — BP 111/77 | HR 88 | Temp 97.7°F | Ht 65.0 in | Wt 232.4 lb

## 2017-01-12 DIAGNOSIS — J40 Bronchitis, not specified as acute or chronic: Secondary | ICD-10-CM | POA: Diagnosis not present

## 2017-01-12 MED ORDER — BENZONATATE 100 MG PO CAPS: 100.0000 mg | ORAL_CAPSULE | Freq: Three times a day (TID) | ORAL | 0 refills | Status: DC | PRN

## 2017-01-12 MED ORDER — AZITHROMYCIN 250 MG PO TABS: ORAL_TABLET | ORAL | 0 refills | Status: DC

## 2017-01-12 MED ORDER — METHYLPREDNISOLONE ACETATE 80 MG/ML IJ SUSP
80.0000 mg | Freq: Once | INTRAMUSCULAR | Status: AC
  Administered 2017-01-12: 80 mg via INTRAMUSCULAR

## 2017-01-12 NOTE — Patient Instructions (Signed)

## 2017-01-12 NOTE — Progress Notes (Signed)
Subjective:    Patient ID: Chase Stout, male    DOB: 01/29/1981, 36 y.o.   MRN: [id number]  HPI Patient comes in today c/o cough and congestion that started over a week ago- started OTC meds which helped some.Marland Kitchen. He had a steroid pack at home that he started taking which helped at first but when got to the end of pack he started coughing again.    Review of Systems  Constitutional: Negative for appetite change, chills and fever.  HENT: Positive for congestion, rhinorrhea and sinus pressure. Negative for ear pain, sore throat and trouble swallowing.   Respiratory: Positive for cough (nonproductive). Negative for shortness of breath.   Cardiovascular: Negative.   Gastrointestinal: Negative.   Genitourinary: Negative.   Neurological: Negative.   Psychiatric/Behavioral: Negative.   All other systems reviewed and are negative.      Objective:   Physical Exam  Constitutional: He is oriented to person, place, and time. He appears well-developed and well-nourished. No distress.  HENT:  Right Ear: Hearing, tympanic membrane, external ear and ear canal normal.  Left Ear: Hearing, tympanic membrane, external ear and ear canal normal.  Nose: Mucosal edema and rhinorrhea present. Right sinus exhibits no maxillary sinus tenderness and no frontal sinus tenderness. Left sinus exhibits no maxillary sinus tenderness and no frontal sinus tenderness.  Mouth/Throat: Uvula is midline, oropharynx is clear and moist and mucous membranes are normal.  Neck: Normal range of motion. Neck supple.  Cardiovascular: Normal rate and regular rhythm.   Pulmonary/Chest: Effort normal and breath sounds normal.  Lymphadenopathy:    He has no cervical adenopathy.  Neurological: He is alert and oriented to person, place, and time.  Skin: Skin is warm.  Psychiatric: He has a normal mood and affect. His behavior is normal. Judgment and thought content normal.   BP 111/77   Pulse 88   Temp 97.7 F (36.5 C) (Oral)    Ht 5\' 5"  (1.651 m)   Wt 232 lb 6.4 oz (105.4 kg)   BMI 38.67 kg/m        Assessment & Plan:   1. Bronchitis    Meds ordered this encounter  Medications  . azithromycin (ZITHROMAX Z-PAK) 250 MG tablet    Sig: As directed    Dispense:  6 tablet    Refill:  0    Order Specific Question:   Supervising Provider    Answer:   VINCENT, CAROL L [[id number]]  . benzonatate (TESSALON PERLES) 100 MG capsule    Sig: Take 1 capsule (100 mg total) by mouth 3 (three) times daily as needed for cough.    Dispense:  20 capsule    Refill:  0    Order Specific Question:   Supervising Provider    Answer:   VINCENT, CAROL L [[id number]]  . methylPREDNISolone acetate (DEPO-MEDROL) injection 80 mg   1. Take meds as prescribed 2. Use a cool mist humidifier especially during the winter months and when heat has been humid. 3. Use saline nose sprays frequently 4. Saline irrigations of the nose can be very helpful if done frequently.  * 4X daily for 1 week*  * Use of a nettie pot can be helpful with this. Follow directions with this* 5. Drink plenty of fluids 6. Keep thermostat turn down low 7.For any cough or congestion  Use plain Mucinex- regular strength or max strength is fine   * Children- consult with Pharmacist for dosing 8. For fever or aces or pains- take  tylenol or ibuprofen appropriate for age and weight.  * for fevers greater than 101 orally you may alternate ibuprofen and tylenol every  3 hours.   Mary-Margaret Daphine DeutscherMartin, FNP

## 2017-01-27 ENCOUNTER — Other Ambulatory Visit: Payer: Self-pay | Admitting: Family Medicine

## 2017-02-26 ENCOUNTER — Ambulatory Visit (INDEPENDENT_AMBULATORY_CARE_PROVIDER_SITE_OTHER): Payer: BLUE CROSS/BLUE SHIELD | Admitting: Otolaryngology

## 2017-02-26 DIAGNOSIS — K219 Gastro-esophageal reflux disease without esophagitis: Secondary | ICD-10-CM

## 2017-02-26 DIAGNOSIS — R490 Dysphonia: Secondary | ICD-10-CM

## 2017-03-26 ENCOUNTER — Other Ambulatory Visit: Payer: Self-pay | Admitting: Family Medicine

## 2017-04-19 ENCOUNTER — Other Ambulatory Visit: Payer: Self-pay | Admitting: *Deleted

## 2017-04-19 MED ORDER — PANTOPRAZOLE SODIUM 40 MG PO TBEC: 40.0000 mg | DELAYED_RELEASE_TABLET | Freq: Every day | ORAL | 0 refills | Status: DC

## 2017-04-27 ENCOUNTER — Other Ambulatory Visit: Payer: Self-pay | Admitting: Nurse Practitioner

## 2017-04-28 ENCOUNTER — Other Ambulatory Visit: Payer: Self-pay | Admitting: Family Medicine

## 2017-05-14 ENCOUNTER — Encounter: Payer: Self-pay | Admitting: Family Medicine

## 2017-05-14 ENCOUNTER — Ambulatory Visit (INDEPENDENT_AMBULATORY_CARE_PROVIDER_SITE_OTHER): Payer: BLUE CROSS/BLUE SHIELD | Admitting: Family Medicine

## 2017-05-14 VITALS — BP 122/80 | HR 54 | Temp 97.1°F | Ht 65.0 in | Wt 217.0 lb

## 2017-05-14 DIAGNOSIS — H8112 Benign paroxysmal vertigo, left ear: Secondary | ICD-10-CM

## 2017-05-14 MED ORDER — ONDANSETRON 4 MG PO TBDP
4.0000 mg | ORAL_TABLET | Freq: Once | ORAL | Status: AC
  Administered 2017-05-14: 4 mg via ORAL

## 2017-05-14 MED ORDER — ONDANSETRON 4 MG PO TBDP: 4.0000 mg | ORAL_TABLET | Freq: Three times a day (TID) | ORAL | 0 refills | Status: DC | PRN

## 2017-05-14 MED ORDER — MECLIZINE HCL 25 MG PO TABS: 25.0000 mg | ORAL_TABLET | Freq: Three times a day (TID) | ORAL | 0 refills | Status: DC | PRN

## 2017-05-14 NOTE — Progress Notes (Signed)
Subjective: CC: dizziness  PCP: Elenora GammaBradshaw, Samuel L, MD ZOX:WRUEAVHPI:Chase Stout is a 36 y.o. male presenting to clinic today for:  1. Dizziness Patient reports acute onset of dizziness starting last evening. He notes that this actually had resolved but recurred when he woke up this morning. He notes that dizziness feels like "room spinning" and is most notable when he rolls or looks to the left. He reports associated nausea with vomiting, 2 or 3 times this morning. Reports mild left-sided headache that started after vomiting this morning. Denies double vision, numbness, tingling, weakness, speech difficulty, changes in voice, changes in hearing. He reports some nasal congestion that has been going on for 1 week. Denies fevers, cough, shortness of breath, chills. No history of migraine headaches. Reports that balance was off secondary to vertigo. This has gotten better. Denies hematemesis, hematochezia. No new medications. He has been hydrating well. No recent illness, no sick contacts, no recent travel, no trauma.  Additionally, he is a Emergency planning/management officerpolice officer. He resides with his wife.  No Known Allergies Past Medical History:  Diagnosis Date  . Allergy   . Hydrocele 01/31/1999   small hydroceles bilaterally, small calcified loose body in left hemiscrotum  . Osteoarthritis of left shoulder 06/18/2012  . Sinus infection    Family History  Problem Relation Age of Onset  . Hypertension Mother   . Cancer Father        lung  . Hypertension Brother    Social Hx: non smoker.Current medications reviewed.    Orthostatic VS for the past 24 hrs:  BP- Lying Pulse- Lying BP- Sitting Pulse- Sitting BP- Standing at 0 minutes Pulse- Standing at 0 minutes  05/14/17 0945 115/78 53 122/80 54 114/81 68    ROS: Per HPI  Objective: Office vital signs reviewed. BP 122/80   Pulse (!) 54   Temp (!) 97.1 F (36.2 C) (Oral)   Ht 5\' 5"  (1.651 m)   Wt 217 lb (98.4 kg)   BMI 36.11 kg/m   Physical Examination:   General: Awake, alert, well nourished, No acute distress HEENT: Normal    Neck: No masses palpated. No lymphadenopathy    Ears: Tympanic membranes intact, normal light reflex, no erythema, no bulging    Eyes: PERRLA, extraocular membranes intact, sclera white    Nose: nasal turbinates moist, no nasal discharge    Throat: moist mucus membranes, no erythema, no tonsillar exudate.  Airway is patent Cardio: slightly bradycardic, regular rhythm, S1S2 heard, no murmurs appreciated Pulm: clear to auscultation bilaterally, no wheezes, rhonchi or rales; normal work of breathing on room air MSK: normal gait and normal station Skin: dry; intact; no rashes or lesions Neuro: 5/5 UE and LE Strength and light touch sensation grossly intact; CN 2-12 grossly in tact, no nystagmus on exam. + reproduction of symptoms with Dix-Hallpike to Left.  Orthostatic VS for the past 24 hrs:  BP- Lying Pulse- Lying BP- Sitting Pulse- Sitting BP- Standing at 0 minutes Pulse- Standing at 0 minutes  05/14/17 0945 115/78 53 122/80 54 114/81 68    Assessment/ Plan: 36 y.o. male   1. Benign paroxysmal positional vertigo of left ear Dizziness is consistent with BPPV. He had marketed symptoms after DixHallpike to the left. No associated nice stack mass. Normal neurologic exam. Orthostatic vitals negative.  Zofran ODT 4 mg was administered during this visit. A prescription for Zofran to use every 8 hours at home was also provided. I also prescribed him meclizine, but I discussed with  him that this may or may not be helpful with the vertigo. I did caution sedation and recommended that he remain out of work for the rest of the day. I advised him not to operate heavy machinery while on Zofran or meclizine as this can be sedating. Referral to this tubular rehabilitation was placed today. Home Epley maneuver was provided. I advised him to take Zofran prior to performing this maneuver. Strict precautions were reviewed with patient, see  AVS. Patient to follow up as needed with primary care provider or call with questions. - ondansetron (ZOFRAN-ODT) disintegrating tablet 4 mg; Take 1 tablet (4 mg total) by mouth once. - ondansetron (ZOFRAN ODT) 4 MG disintegrating tablet; Take 1 tablet (4 mg total) by mouth every 8 (eight) hours as needed for nausea or vomiting.  Dispense: 20 tablet; Refill: 0 - meclizine (ANTIVERT) 25 MG tablet; Take 1 tablet (25 mg total) by mouth 3 (three) times daily as needed for dizziness.  Dispense: 30 tablet; Refill: 0 - Ambulatory referral to Physical Therapy   Orders Placed This Encounter  Procedures  . Ambulatory referral to Physical Therapy    Referral Priority:   Urgent    Referral Type:   Physical Medicine    Referral Reason:   Specialty Services Required    Requested Specialty:   Physical Therapy    Number of Visits Requested:   1   Meds ordered this encounter  Medications  . ondansetron (ZOFRAN-ODT) disintegrating tablet 4 mg  . ondansetron (ZOFRAN ODT) 4 MG disintegrating tablet    Sig: Take 1 tablet (4 mg total) by mouth every 8 (eight) hours as needed for nausea or vomiting.    Dispense:  20 tablet    Refill:  0  . meclizine (ANTIVERT) 25 MG tablet    Sig: Take 1 tablet (25 mg total) by mouth 3 (three) times daily as needed for dizziness.    Dispense:  30 tablet    Refill:  0     Ashly Hulen Skains, DO [facility] [phone]

## 2017-05-14 NOTE — Patient Instructions (Signed)
His symptoms are consistent with something called BPPV. I included information regarding this diagnosis below. I prescribed Zofran for your nausea and meclizine for the dizziness. As we discussed, some patients will still exhibits some dizziness despite use of meclizine. I've also given continue the home version of the Epley maneuver. I suggest that you attempt this after taking Zofran. I have also placed a referral to physical therapy for vestibular rehabilitation. We discussed reasons to seek emergent care and emergency department including worsening symptoms, worsening headache, change in vision, numbness, tingling, weakness, slurred speech. Please call me if you have any questions or concerns.   Benign Positional Vertigo Vertigo is the feeling that you or your surroundings are moving when they are not. Benign positional vertigo is the most common form of vertigo. The cause of this condition is not serious (is benign). This condition is triggered by certain movements and positions (is positional). This condition can be dangerous if it occurs while you are doing something that could endanger you or others, such as driving. What are the causes? In many cases, the cause of this condition is not known. It may be caused by a disturbance in an area of the inner ear that helps your brain to sense movement and balance. This disturbance can be caused by a viral infection (labyrinthitis), head injury, or repetitive motion. What increases the risk? This condition is more likely to develop in:  Women.  People who are 78 years of age or older.  What are the signs or symptoms? Symptoms of this condition usually happen when you move your head or your eyes in different directions. Symptoms may start suddenly, and they usually last for less than a minute. Symptoms may include:  Loss of balance and falling.  Feeling like you are spinning or moving.  Feeling like your surroundings are spinning or  moving.  Nausea and vomiting.  Blurred vision.  Dizziness.  Involuntary eye movement (nystagmus).  Symptoms can be mild and cause only slight annoyance, or they can be severe and interfere with daily life. Episodes of benign positional vertigo may return (recur) over time, and they may be triggered by certain movements. Symptoms may improve over time. How is this diagnosed? This condition is usually diagnosed by medical history and a physical exam of the head, neck, and ears. You may be referred to a health care provider who specializes in ear, nose, and throat (ENT) problems (otolaryngologist) or a provider who specializes in disorders of the nervous system (neurologist). You may have additional testing, including:  MRI.  A CT scan.  Eye movement tests. Your health care provider may ask you to change positions quickly while he or she watches you for symptoms of benign positional vertigo, such as nystagmus. Eye movement may be tested with an electronystagmogram (ENG), caloric stimulation, the Dix-Hallpike test, or the roll test.  An electroencephalogram (EEG). This records electrical activity in your brain.  Hearing tests.  How is this treated? Usually, your health care provider will treat this by moving your head in specific positions to adjust your inner ear back to normal. Surgery may be needed in severe cases, but this is rare. In some cases, benign positional vertigo may resolve on its own in 2-4 weeks. Follow these instructions at home: Safety  Move slowly.Avoid sudden body or head movements.  Avoid driving.  Avoid operating heavy machinery.  Avoid doing any tasks that would be dangerous to you or others if a vertigo episode would occur.  If you  have trouble walking or keeping your balance, try using a cane for stability. If you feel dizzy or unstable, sit down right away.  Return to your normal activities as told by your health care provider. Ask your health care  provider what activities are safe for you. General instructions  Take over-the-counter and prescription medicines only as told by your health care provider.  Avoid certain positions or movements as told by your health care provider.  Drink enough fluid to keep your urine clear or pale yellow.  Keep all follow-up visits as told by your health care provider. This is important. Contact a health care provider if:  You have a fever.  Your condition gets worse or you develop new symptoms.  Your family or friends notice any behavioral changes.  Your nausea or vomiting gets worse.  You have numbness or a "pins and needles" sensation. Get help right away if:  You have difficulty speaking or moving.  You are always dizzy.  You faint.  You develop severe headaches.  You have weakness in your legs or arms.  You have changes in your hearing or vision.  You develop a stiff neck.  You develop sensitivity to light. This information is not intended to replace advice given to you by your health care provider. Make sure you discuss any questions you have with your health care provider. Document Released: 05/29/2006 Document Revised: 01/27/2016 Document Reviewed: 12/14/2014 Elsevier Interactive Patient Education  [employer]2018 [employer]

## 2017-05-15 ENCOUNTER — Telehealth: Payer: Self-pay | Admitting: Family Medicine

## 2017-05-15 NOTE — Telephone Encounter (Signed)
Call to follow-up on vertigo. Patient reports that by the time yesterday he was feeling much improved. He does note slight symptoms this morning consistent with BPPV. He has not yet tried at home Epley maneuver. Though notes his symptoms are well controlled with meclizine and Zofran, such that he was able to return to work today without difficulty. I encouraged patient to follow-up as needed and keep appointment with vestibular rehabilitation if needed. He voiced good understanding and great appreciation for phone call.  Ashly M. Nadine CountsGottschalk, DO [facility]

## 2017-05-25 ENCOUNTER — Other Ambulatory Visit: Payer: Self-pay | Admitting: Family Medicine

## 2017-06-11 ENCOUNTER — Encounter: Payer: Self-pay | Admitting: Family Medicine

## 2017-06-11 ENCOUNTER — Ambulatory Visit (INDEPENDENT_AMBULATORY_CARE_PROVIDER_SITE_OTHER): Payer: BLUE CROSS/BLUE SHIELD | Admitting: Family Medicine

## 2017-06-11 VITALS — BP 118/70 | HR 86 | Temp 97.4°F | Ht 65.0 in | Wt 222.0 lb

## 2017-06-11 DIAGNOSIS — J0111 Acute recurrent frontal sinusitis: Secondary | ICD-10-CM

## 2017-06-11 MED ORDER — PREDNISONE 20 MG PO TABS: ORAL_TABLET | ORAL | 0 refills | Status: DC

## 2017-06-11 MED ORDER — CEFDINIR 300 MG PO CAPS: 300.0000 mg | ORAL_CAPSULE | Freq: Two times a day (BID) | ORAL | 0 refills | Status: DC

## 2017-06-11 NOTE — Progress Notes (Signed)
BP 118/70   Pulse 86   Temp (!) 97.4 F (36.3 C) (Oral)   Ht  (1.651 m)   Wt 222 lb (100.7 kg)   BMI 36.94 kg/m    Subjective:    Patient ID: Chase Stout, male    DOB: 1981-08-08, 36 y.o.   MRN: [id number]  HPI: Chase Stout is a 36 y.o. male presenting on 06/11/2017 for Sinusitis (x 9-10 days, sinus congestion, headache, sinus drainage, cough)   HPI Sinus congestion and headache and drainage Patient has been having sinus congestion and headache and drainage that has been increasing over the past week and a half. He says they had to go into a location that had a lot of dust and mildew and mold for training and since then he has been having issues. He is continuing to use his Singulair and his inhalers and they have helped but are not able to get rid of it. He denies any fevers or chills or wheezing but does have a cough that is productive.  Relevant past medical, surgical, family and social history reviewed and updated as indicated. Interim medical history since our last visit reviewed. Allergies and medications reviewed and updated.  Review of Systems  Constitutional: Negative for chills and fever.  HENT: Positive for congestion, rhinorrhea, sinus pressure, sneezing and sore throat. Negative for ear discharge, ear pain, postnasal drip and voice change.   Eyes: Negative for pain, discharge, redness and visual disturbance.  Respiratory: Positive for cough. Negative for shortness of breath and wheezing.   Cardiovascular: Negative for chest pain and leg swelling.  Musculoskeletal: Negative for gait problem.  Skin: Negative for rash.  All other systems reviewed and are negative.   Per HPI unless specifically indicated above      Objective:    BP 118/70   Pulse 86   Temp (!) 97.4 F (36.3 C) (Oral)   Ht  (1.651 m)   Wt 222 lb (100.7 kg)   BMI 36.94 kg/m   Wt Readings from Last 3 Encounters:  06/11/17 222 lb (100.7 kg)  05/14/17 217 lb (98.4 kg)    01/12/17 232 lb 6.4 oz (105.4 kg)    Physical Exam  Constitutional: He is oriented to person, place, and time. He appears well-developed and well-nourished. No distress.  HENT:  Right Ear: Tympanic membrane, external ear and ear canal normal.  Left Ear: Tympanic membrane, external ear and ear canal normal.  Nose: Mucosal edema and rhinorrhea present. No sinus tenderness. No epistaxis. Right sinus exhibits maxillary sinus tenderness. Right sinus exhibits no frontal sinus tenderness. Left sinus exhibits maxillary sinus tenderness. Left sinus exhibits no frontal sinus tenderness.  Mouth/Throat: Uvula is midline and mucous membranes are normal. Posterior oropharyngeal edema and posterior oropharyngeal erythema present. No oropharyngeal exudate or tonsillar abscesses.  Eyes: Pupils are equal, round, and reactive to light. Conjunctivae and EOM are normal. Right eye exhibits no discharge. No scleral icterus.  Neck: Neck supple. No thyromegaly present.  Cardiovascular: Normal rate, regular rhythm, normal heart sounds and intact distal pulses.   No murmur heard. Pulmonary/Chest: Effort normal and breath sounds normal. No respiratory distress. He has no wheezes. He has no rales.  Musculoskeletal: Normal range of motion. He exhibits no edema.  Lymphadenopathy:    He has no cervical adenopathy.  Neurological: He is alert and oriented to person, place, and time. Coordination normal.  Skin: Skin is warm and dry. No rash noted. He is not diaphoretic.  Psychiatric:  He has a normal mood and affect. His behavior is normal.  Nursing note and vitals reviewed.     Assessment & Plan:   Problem List Items Addressed This Visit    None    Visit Diagnoses    Acute recurrent frontal sinusitis    -  Primary   Relevant Medications   cefdinir (OMNICEF) 300 MG capsule   predniSONE (DELTASONE) 20 MG tablet       Follow up plan: Return if symptoms worsen or fail to improve.  Counseling provided for all of  the vaccine components No orders of the defined types were placed in this encounter.   Arville Care, MD [facility] 06/11/2017, 2:56 PM

## 2017-07-05 ENCOUNTER — Encounter: Payer: Self-pay | Admitting: Family Medicine

## 2017-07-05 ENCOUNTER — Ambulatory Visit (INDEPENDENT_AMBULATORY_CARE_PROVIDER_SITE_OTHER): Payer: BLUE CROSS/BLUE SHIELD | Admitting: Family Medicine

## 2017-07-05 VITALS — BP 115/74 | HR 93 | Temp 97.9°F | Ht 65.0 in | Wt 211.4 lb

## 2017-07-05 DIAGNOSIS — E669 Obesity, unspecified: Secondary | ICD-10-CM

## 2017-07-05 DIAGNOSIS — Z23 Encounter for immunization: Secondary | ICD-10-CM

## 2017-07-05 DIAGNOSIS — G4700 Insomnia, unspecified: Secondary | ICD-10-CM

## 2017-07-05 DIAGNOSIS — Z0000 Encounter for general adult medical examination without abnormal findings: Secondary | ICD-10-CM

## 2017-07-05 MED ORDER — ZOLPIDEM TARTRATE 5 MG PO TABS
5.0000 mg | ORAL_TABLET | Freq: Every evening | ORAL | 5 refills | Status: DC | PRN
Start: 2017-07-05 — End: 2017-11-22

## 2017-07-05 NOTE — Patient Instructions (Signed)
Great to see you!  Come back in 6 months to follow up insomnia unless you need us sooner.

## 2017-07-05 NOTE — Progress Notes (Signed)
   HPI  Patient presents today here for annual physical exam.  Patient feels well and is in good health.  He has been exercising consistently and watching his diet.  He is proud of his weight loss.  He has continued difficulty sleeping.  Trazodone has caused persistent dizzy symptoms.  He stopped it for a time and restarted and can easily confirm that it was definitely trazodone.  Difficulty sleeping on most nights.  Difficulty falling asleep.  We discussed this a few times before.  He was given Ambien for  PMH: Smoking status noted ROS: Per HPI  Objective: BP 115/74   Pulse 93   Temp 97.9 F (36.6 C) (Oral)   Ht 5' 5" (1.651 m)   Wt 211 lb 6.4 oz (95.9 kg)   BMI 35.18 kg/m  Gen: NAD, alert, cooperative with exam HEENT: NCAT, TMs normal bilaterally, oropharynx moist and clear CV: RRR, good S1/S2, no murmur Resp: CTABL, no wheezes, non-labored Abd: SNTND, BS present, no guarding or organomegaly Ext: No edema, warm Neuro: Alert and oriented, No gross deficits  Assessment and plan:  #Annual physical exam Normal exam except for weight, patient is improving from that perspective Congratulated on weight loss Return for fasting labs  #Insomnia Starting Ambien today, patient has tried trazodone and had dizziness from that.*   Orders Placed This Encounter  Procedures  . CMP14+EGFR    Standing Status:   Future    Standing Expiration Date:   07/05/2018  . CBC with Differential/Platelet    Standing Status:   Future    Standing Expiration Date:   07/05/2018  . Lipid panel    Standing Status:   Future    Standing Expiration Date:   07/05/2018  . TSH    Standing Status:   Future    Standing Expiration Date:   07/05/2018    Meds ordered this encounter  Medications  . zolpidem (AMBIEN) 5 MG tablet    Sig: Take 1 tablet (5 mg total) by mouth at bedtime as needed for sleep.    Dispense:  30 tablet    Refill:  [place], MD [facility] 07/05/2017, 10:19 AM

## 2017-07-23 ENCOUNTER — Other Ambulatory Visit: Payer: Self-pay | Admitting: Family Medicine

## 2017-08-22 ENCOUNTER — Other Ambulatory Visit: Payer: Self-pay | Admitting: Nurse Practitioner

## 2017-09-17 DIAGNOSIS — R891 Abnormal level of hormones in specimens from other organs, systems and tissues: Secondary | ICD-10-CM | POA: Diagnosis not present

## 2017-09-17 DIAGNOSIS — R5383 Other fatigue: Secondary | ICD-10-CM | POA: Diagnosis not present

## 2017-09-17 DIAGNOSIS — E349 Endocrine disorder, unspecified: Secondary | ICD-10-CM | POA: Diagnosis not present

## 2017-10-24 ENCOUNTER — Encounter: Payer: Self-pay | Admitting: Family Medicine

## 2017-10-24 ENCOUNTER — Ambulatory Visit: Payer: BLUE CROSS/BLUE SHIELD | Admitting: Family Medicine

## 2017-10-24 VITALS — BP 111/74 | HR 76 | Temp 97.6°F | Ht 65.0 in | Wt 220.0 lb

## 2017-10-24 DIAGNOSIS — J019 Acute sinusitis, unspecified: Secondary | ICD-10-CM | POA: Diagnosis not present

## 2017-10-24 DIAGNOSIS — B9689 Other specified bacterial agents as the cause of diseases classified elsewhere: Secondary | ICD-10-CM

## 2017-10-24 MED ORDER — AMOXICILLIN-POT CLAVULANATE 875-125 MG PO TABS: 1.0000 | ORAL_TABLET | Freq: Two times a day (BID) | ORAL | 0 refills | Status: DC

## 2017-10-24 NOTE — Patient Instructions (Signed)
You are being treated for a bacterial sinus infection with Augmentin.  Take this twice a day for the next 10 days.  You may continue current home care regimens.  - Get plenty of rest and drink plenty of fluids. - Try to breathe moist air. Use a cold mist humidifier. - Consume warm fluids (soup or tea) to provide relief for a stuffy nose and to loosen phlegm. - For nasal stuffiness, try saline nasal spray or a Neti Pot. Afrin nasal spray can also be used but this product should not be used longer than 3 days or it will cause rebound nasal stuffiness (worsening nasal congestion). - For sore throat pain relief: suck on throat lozenges, hard candy or popsicles; gargle with warm salt water (1/4 tsp. salt per 8 oz. of water); and eat soft, bland foods. - Eat a well-balanced diet. If you cannot, ensure you are getting enough nutrients by taking a daily multivitamin. - Avoid dairy products, as they can thicken phlegm. - Avoid alcohol, as it impairs your body's immune system.   Sinusitis, Adult Sinusitis is soreness and inflammation of your sinuses. Sinuses are hollow spaces in the bones around your face. Your sinuses are located:  Around your eyes.  In the middle of your forehead.  Behind your nose.  In your cheekbones.  Your sinuses and nasal passages are lined with a stringy fluid (mucus). Mucus normally drains out of your sinuses. When your nasal tissues become inflamed or swollen, the mucus can become trapped or blocked so air cannot flow through your sinuses. This allows bacteria, viruses, and funguses to grow, which leads to infection. Sinusitis can develop quickly and last for 7?10 days (acute) or for more than 12 weeks (chronic). Sinusitis often develops after a cold. What are the causes? This condition is caused by anything that creates swelling in the sinuses or stops mucus from draining, including:  Allergies.  Asthma.  Bacterial or viral infection.  Abnormally shaped bones  between the nasal passages.  Nasal growths that contain mucus (nasal polyps).  Narrow sinus openings.  Pollutants, such as chemicals or irritants in the air.  A foreign object stuck in the nose.  A fungal infection. This is rare.  What increases the risk? The following factors may make you more likely to develop this condition:  Having allergies or asthma.  Having had a recent cold or respiratory tract infection.  Having structural deformities or blockages in your nose or sinuses.  Having a weak immune system.  Doing a lot of swimming or diving.  Overusing nasal sprays.  Smoking.  What are the signs or symptoms? The main symptoms of this condition are pain and a feeling of pressure around the affected sinuses. Other symptoms include:  Upper toothache.  Earache.  Headache.  Bad breath.  Decreased sense of smell and taste.  A cough that may get worse at night.  Fatigue.  Fever.  Thick drainage from your nose. The drainage is often green and it may contain pus (purulent).  Stuffy nose or congestion.  Postnasal drip. This is when extra mucus collects in the throat or back of the nose.  Swelling and warmth over the affected sinuses.  Sore throat.  Sensitivity to light.  How is this diagnosed? This condition is diagnosed based on symptoms, a medical history, and a physical exam. To find out if your condition is acute or chronic, your health care provider may:  Look in your nose for signs of nasal polyps.  Tap over  the affected sinus to check for signs of infection.  View the inside of your sinuses using an imaging device that has a light attached (endoscope).  If your health care provider suspects that you have chronic sinusitis, you may also:  Be tested for allergies.  Have a sample of mucus taken from your nose (nasal culture) and checked for bacteria.  Have a mucus sample examined to see if your sinusitis is related to an allergy.  If your  sinusitis does not respond to treatment and it lasts longer than 8 weeks, you may have an MRI or CT scan to check your sinuses. These scans also help to determine how severe your infection is. In rare cases, a bone biopsy may be done to rule out more serious types of fungal sinus disease. How is this treated? Treatment for sinusitis depends on the cause and whether your condition is chronic or acute. If a virus is causing your sinusitis, your symptoms will go away on their own within 10 days. You may be given medicines to relieve your symptoms, including:  Topical nasal decongestants. They shrink swollen nasal passages and let mucus drain from your sinuses.  Antihistamines. These drugs block inflammation that is triggered by allergies. This can help to ease swelling in your nose and sinuses.  Topical nasal corticosteroids. These are nasal sprays that ease inflammation and swelling in your nose and sinuses.  Nasal saline washes. These rinses can help to get rid of thick mucus in your nose.  If your condition is caused by bacteria, you will be given an antibiotic medicine. If your condition is caused by a fungus, you will be given an antifungal medicine. Surgery may be needed to correct underlying conditions, such as narrow nasal passages. Surgery may also be needed to remove polyps. Follow these instructions at home: Medicines  Take, use, or apply over-the-counter and prescription medicines only as told by your health care provider. These may include nasal sprays.  If you were prescribed an antibiotic medicine, take it as told by your health care provider. Do not stop taking the antibiotic even if you start to feel better. Hydrate and Humidify  Drink enough water to keep your urine clear or pale yellow. Staying hydrated will help to thin your mucus.  Use a cool mist humidifier to keep the humidity level in your home above 50%.  Inhale steam for 10-15 minutes, 3-4 times a day or as told by  your health care provider. You can do this in the bathroom while a hot shower is running.  Limit your exposure to cool or dry air. Rest  Rest as much as possible.  Sleep with your head raised (elevated).  Make sure to get enough sleep each night. General instructions  Apply a warm, moist washcloth to your face 3-4 times a day or as told by your health care provider. This will help with discomfort.  Wash your hands often with soap and water to reduce your exposure to viruses and other germs. If soap and water are not available, use hand sanitizer.  Do not smoke. Avoid being around people who are smoking (secondhand smoke).  Keep all follow-up visits as told by your health care provider. This is important. Contact a health care provider if:  You have a fever.  Your symptoms get worse.  Your symptoms do not improve within 10 days. Get help right away if:  You have a severe headache.  You have persistent vomiting.  You have pain or swelling  around your face or eyes.  You have vision problems.  You develop confusion.  Your neck is stiff.  You have trouble breathing. This information is not intended to replace advice given to you by your health care provider. Make sure you discuss any questions you have with your health care provider. Document Released: 08/21/2005 Document Revised: 04/16/2016 Document Reviewed: 06/16/2015 Elsevier Interactive Patient Education  [employer].

## 2017-10-24 NOTE — Progress Notes (Signed)
Subjective: CC: URI PCP: Elenora GammaBradshaw, Samuel L, MD AVW:[id number]:Chase Stout is a 37 y.o. male presenting to clinic today for:  1. Cold symptoms  Patient reports sinus pressure, headache, cough that started over a week and a half ago.  He notes that his sinus drainage is a bright green -yellow discharge.  Denies hemoptysis, SOB, dizziness, rash, nausea, vomiting, diarrhea, fevers, chills, myalgia, sick contacts, recent travel.  He notes that symptoms seem to be worsened by exposure to smoke over the weekend.  Patient has used Mucinex, Tylenol, Singulair and Breo with little relief of symptoms.  Denies history of COPD.  He has a history of asthma.  Denies tobacco use.    ROS: Per HPI  No Known Allergies Past Medical History:  Diagnosis Date  . Allergy   . Hydrocele 01/31/1999   small hydroceles bilaterally, small calcified loose body in left hemiscrotum  . Osteoarthritis of left shoulder 06/18/2012  . Sinus infection     Current Outpatient Medications:  .  BREO ELLIPTA 100-25 MCG/INH AEPB, TAKE 1 PUFF BY MOUTH EVERY DAY, Disp: 60 each, Rfl: 4 .  meclizine (ANTIVERT) 25 MG tablet, Take 1 tablet (25 mg total) by mouth 3 (three) times daily as needed for dizziness., Disp: 30 tablet, Rfl: 0 .  meloxicam (MOBIC) 15 MG tablet, TAKE 1 TABLET BY MOUTH EVERY DAY WITH FOOD X 2 WEEKS, THEN AS NEEDED, Disp: 30 tablet, Rfl: 0 .  montelukast (SINGULAIR) 10 MG tablet, TAKE 1 TABLET (10 MG TOTAL) BY MOUTH AT BEDTIME., Disp: 30 tablet, Rfl: 9 .  ondansetron (ZOFRAN ODT) 4 MG disintegrating tablet, Take 1 tablet (4 mg total) by mouth every 8 (eight) hours as needed for nausea or vomiting., Disp: 20 tablet, Rfl: 0 .  pantoprazole (PROTONIX) 40 MG tablet, TAKE 1 TABLET BY MOUTH EVERY DAY, Disp: 30 tablet, Rfl: 5 .  traZODone (DESYREL) 100 MG tablet, TAKE 1 TABLET (100 MG TOTAL) BY MOUTH AT BEDTIME., Disp: 30 tablet, Rfl: 2 .  zolpidem (AMBIEN) 5 MG tablet, Take 1 tablet (5 mg total) by mouth at bedtime as needed  for sleep., Disp: 30 tablet, Rfl: 5 Social History   Socioeconomic History  . Marital status: Married    Spouse name: Not on file  . Number of children: Not on file  . Years of education: 1314  . Highest education level: Not on file  Social Needs  . Financial resource strain: Not on file  . Food insecurity - worry: Not on file  . Food insecurity - inability: Not on file  . Transportation needs - medical: Not on file  . Transportation needs - non-medical: Not on file  Occupational History    Employer: [employer]  Tobacco Use  . Smoking status: Never Smoker  . Smokeless tobacco: Never Used  Substance and Sexual Activity  . Alcohol use: Yes    Alcohol/week: 1.2 oz    Types: 2 Cans of beer per week    Comment: occasionally  . Drug use: No  . Sexual activity: Not on file  Other Topics Concern  . Not on file  Social History Narrative  . Not on file   Family History  Problem Relation Age of Onset  . Hypertension Mother   . Cancer Father        lung  . Hypertension Brother     Objective: Office vital signs reviewed. BP 111/74   Pulse 76   Temp 97.6 F (36.4 C) (Oral)   Ht 5'  5" (1.651 m)   Wt 220 lb (99.8 kg)   BMI 36.61 kg/m   Physical Examination:  General: Awake, alert, well nourished, nontoxic appearing, No acute distress HEENT: Normal    Neck: No masses palpated. No lymphadenopathy    Ears: Tympanic membranes intact, normal light reflex, no erythema, no bulging    Eyes: PERRLA, extraocular membranes intact, sclera white    Nose: nasal turbinates moist, opaque nasal discharge    Throat: moist mucus membranes, moderate oropharyngeal erythema, no tonsillar exudate.  Airway is patent Cardio: regular rate and rhythm, S1S2 heard, no murmurs appreciated Pulm: clear to auscultation bilaterally, no wheezes, rhonchi or rales; normal work of breathing on room air  Assessment/ Plan: 37 y.o. male   1. Acute bacterial sinusitis Patient is afebrile and  nontoxic-appearing on today's exam.  Given his acute worsening of symptoms, will empirically treat with Augmentin 875 p.o. twice daily for the next 10 days.  Home care instructions were reviewed with the patient, including sinus irrigation. Strict return precautions and reasons for emergent evaluation in the emergency department review with patient.  They voiced understanding and will follow-up as needed.  Meds ordered this encounter  Medications  . amoxicillin-clavulanate (AUGMENTIN) 875-125 MG tablet    Sig: Take 1 tablet by mouth 2 (two) times daily.    Dispense:  20 tablet    Refill:  0     Ashly Hulen Skains, DO [facility] [phone]

## 2017-11-12 DIAGNOSIS — E349 Endocrine disorder, unspecified: Secondary | ICD-10-CM | POA: Diagnosis not present

## 2017-11-18 ENCOUNTER — Telehealth: Payer: BLUE CROSS/BLUE SHIELD | Admitting: Family

## 2017-11-18 DIAGNOSIS — J329 Chronic sinusitis, unspecified: Secondary | ICD-10-CM

## 2017-11-18 MED ORDER — DOXYCYCLINE HYCLATE 100 MG PO TABS: 100.0000 mg | ORAL_TABLET | Freq: Two times a day (BID) | ORAL | 0 refills | Status: DC

## 2017-11-18 NOTE — Progress Notes (Signed)

## 2017-11-19 ENCOUNTER — Other Ambulatory Visit: Payer: Self-pay | Admitting: Family Medicine

## 2017-11-21 ENCOUNTER — Ambulatory Visit: Payer: BLUE CROSS/BLUE SHIELD | Admitting: Psychology

## 2017-11-22 ENCOUNTER — Encounter: Payer: Self-pay | Admitting: Family Medicine

## 2017-11-22 ENCOUNTER — Ambulatory Visit: Payer: BLUE CROSS/BLUE SHIELD | Admitting: Family Medicine

## 2017-11-22 VITALS — BP 117/78 | HR 74 | Temp 97.4°F | Ht 65.0 in | Wt 214.6 lb

## 2017-11-22 DIAGNOSIS — J4540 Moderate persistent asthma, uncomplicated: Secondary | ICD-10-CM

## 2017-11-22 DIAGNOSIS — F419 Anxiety disorder, unspecified: Secondary | ICD-10-CM | POA: Diagnosis not present

## 2017-11-22 DIAGNOSIS — G4700 Insomnia, unspecified: Secondary | ICD-10-CM | POA: Diagnosis not present

## 2017-11-22 MED ORDER — MIRTAZAPINE 15 MG PO TABS: 15.0000 mg | ORAL_TABLET | Freq: Every day | ORAL | 3 refills | Status: DC

## 2017-11-22 MED ORDER — ZOLPIDEM TARTRATE 10 MG PO TABS: 10.0000 mg | ORAL_TABLET | Freq: Every evening | ORAL | 3 refills | Status: DC | PRN

## 2017-11-22 MED ORDER — AZITHROMYCIN 250 MG PO TABS: ORAL_TABLET | ORAL | 0 refills | Status: DC

## 2017-11-22 MED ORDER — CEFDINIR 300 MG PO CAPS: 300.0000 mg | ORAL_CAPSULE | Freq: Two times a day (BID) | ORAL | 0 refills | Status: DC

## 2017-11-22 NOTE — Progress Notes (Signed)
   HPI  Patient presents today here with trouble sleeping, anxiety, also,here for follow-up  Difficulty sleeping  Getting worse lately, has been taking 10 mg ambien and this has not been as effective lately.  Has recently seperated from his wife ( approx 2 weeks ago)  No SI + depression and anxiety, racing thoughts His dad is also very ill  Cough and congestion now for about 1 week, no improvement with doxy   PMH: Smoking status noted ROS: Per HPI  Objective: BP 117/78   Pulse 74   Temp (!) 97.4 F (36.3 C) (Oral)   Ht 5\' 5"  (1.651 m)   Wt 214 lb 9.6 oz (97.3 kg)   BMI 35.71 kg/m  Gen: NAD, alert, cooperative with exam HEENT: NCAT CV: RRR, good S1/S2, no murmur Resp: CTABL, no wheezes, non-labored Ext: No edema, warm Neuro: Alert and oriented, No gross deficits  Assessment and plan:  # Anxiety Pt with understandable anxiety due to life events, he has had some baseline anxiety previously Remeron, this should help with sleep as well.  # Insomnia Longstanding, exacerbated by anxiety  # Asthma Likely exacerbation, avoiding steroids due to anxiety, lung exam is reasuring Feels unwell with malaise, add omnicef to doxy.    Meds ordered this encounter  Medications  . zolpidem (AMBIEN) 10 MG tablet    Sig: Take 1 tablet (10 mg total) by mouth at bedtime as needed for sleep.    Dispense:  30 tablet    Refill:  3  . mirtazapine (REMERON) 15 MG tablet    Sig: Take 1 tablet (15 mg total) by mouth at bedtime.    Dispense:  30 tablet    Refill:  3    Murtis SinkSam Bradshaw, MD [facility] 11/22/2017, 10:12 AM

## 2017-12-28 ENCOUNTER — Emergency Department (HOSPITAL_COMMUNITY)
Admission: EM | Admit: 2017-12-28 | Discharge: 2017-12-28 | Disposition: A | Discharge status: Home / Self Care | Payer: BLUE CROSS/BLUE SHIELD | Attending: Emergency Medicine | Admitting: Emergency Medicine

## 2017-12-28 ENCOUNTER — Encounter (HOSPITAL_COMMUNITY): Payer: Self-pay | Admitting: *Deleted

## 2017-12-28 ENCOUNTER — Emergency Department (HOSPITAL_COMMUNITY): Payer: BLUE CROSS/BLUE SHIELD

## 2017-12-28 ENCOUNTER — Other Ambulatory Visit: Payer: Self-pay

## 2017-12-28 DIAGNOSIS — J45909 Unspecified asthma, uncomplicated: Secondary | ICD-10-CM | POA: Diagnosis not present

## 2017-12-28 DIAGNOSIS — Y9367 Activity, basketball: Secondary | ICD-10-CM | POA: Diagnosis not present

## 2017-12-28 DIAGNOSIS — X500XXA Overexertion from strenuous movement or load, initial encounter: Secondary | ICD-10-CM | POA: Diagnosis not present

## 2017-12-28 DIAGNOSIS — Z79899 Other long term (current) drug therapy: Secondary | ICD-10-CM | POA: Diagnosis not present

## 2017-12-28 DIAGNOSIS — S8992XA Unspecified injury of left lower leg, initial encounter: Secondary | ICD-10-CM | POA: Diagnosis not present

## 2017-12-28 DIAGNOSIS — M7989 Other specified soft tissue disorders: Secondary | ICD-10-CM | POA: Diagnosis not present

## 2017-12-28 DIAGNOSIS — Y999 Unspecified external cause status: Secondary | ICD-10-CM | POA: Diagnosis not present

## 2017-12-28 DIAGNOSIS — Y929 Unspecified place or not applicable: Secondary | ICD-10-CM | POA: Diagnosis not present

## 2017-12-28 DIAGNOSIS — S8392XA Sprain of unspecified site of left knee, initial encounter: Secondary | ICD-10-CM | POA: Diagnosis not present

## 2017-12-28 DIAGNOSIS — M25562 Pain in left knee: Secondary | ICD-10-CM | POA: Diagnosis not present

## 2017-12-28 HISTORY — DX: Calculus of kidney: N20.0

## 2017-12-28 MED ORDER — IBUPROFEN 800 MG PO TABS
800.0000 mg | ORAL_TABLET | Freq: Once | ORAL | Status: AC
  Administered 2017-12-28: 800 mg via ORAL
  Filled 2017-12-28: qty 1

## 2017-12-28 MED ORDER — METHOCARBAMOL 500 MG PO TABS: 500.0000 mg | ORAL_TABLET | Freq: Every evening | ORAL | 0 refills | Status: DC | PRN

## 2017-12-28 MED ORDER — IBUPROFEN 800 MG PO TABS: 800.0000 mg | ORAL_TABLET | Freq: Three times a day (TID) | ORAL | 0 refills | Status: DC

## 2017-12-28 NOTE — ED Triage Notes (Signed)
Pt injured left knee during a jump while playing basketball tonight. C/o pain to left knee, worse with attempt to put any pressure on knee

## 2017-12-28 NOTE — ED Notes (Signed)
Ice pack applied to left knee

## 2017-12-28 NOTE — ED Notes (Signed)
From Rad  Pt reports playing b ball and landing w3ith thigh going one way and lower leg going another  Now can't walk on his L leg without pain and it giving out

## 2017-12-28 NOTE — ED Notes (Signed)
Awaiting eval  

## 2017-12-28 NOTE — ED Provider Notes (Signed)
[facility] EMERGENCY DEPARTMENT Provider Note   CSN: [id number] Arrival date & time: 12/28/17  1930     History   Chief Complaint Chief Complaint  Patient presents with  . Knee Pain    HPI Chase Stout is a 37 y.o. male with no pertinent past medical history presenting with acute left knee pain while playing basketball and landing on his left knee and hyperextending.  He reports that he has not been able to put full weight on his knee as it feels like it is unstable.  He denies any numbness or weakness distally.  No head trauma or loss of consciousness.  No other injuries.  He has not taken anything for his symptoms prior to arrival.  Pain is aggravated by extension and relieved by flexion.   HPI  Past Medical History:  Diagnosis Date  . Allergy   . Hydrocele 01/31/1999   small hydroceles bilaterally, small calcified loose body in left hemiscrotum  . Kidney stones   . Osteoarthritis of left shoulder 06/18/2012  . Sinus infection     Patient Active Problem List   Diagnosis Date Noted  . GERD (gastroesophageal reflux disease) 06/14/2015  . Asthma 04/29/2015  . Obesity 04/29/2015  . Vasomotor rhinitis 09/01/2014  . Insomnia 11/22/2012  . Anxiety 11/22/2012  . Rotator cuff dysfunction 06/18/2012  . Osteoarthritis of left shoulder 06/18/2012  . Shoulder pain, left 06/18/2012    Past Surgical History:  Procedure Laterality Date  . EYE SURGERY Bilateral    Lasix  . LITHOTRIPSY    . NASAL SINUS SURGERY    . SHOULDER HEMI-ARTHROPLASTY  09/24/2012   Procedure: SHOULDER HEMI-ARTHROPLASTY;  Surgeon: Eulas Post, MD;  Location: [facility] OR;  Service: Orthopedics;  Laterality: Left;        Home Medications    Prior to Admission medications   Medication Sig Start Date End Date Taking? Authorizing Provider  amoxicillin-clavulanate (AUGMENTIN) 875-125 MG tablet Take 1 tablet by mouth 2 (two) times daily. 10/24/17   Raliegh Ip, DO  anastrozole (ARIMIDEX) 1 MG  tablet Take 1 mg by mouth daily. 11/12/17   [provider]  azithromycin (ZITHROMAX) 250 MG tablet Take 2 tablets on day 1 and 1 tablet daily after that 11/22/17   Elenora Gamma, MD  BREO ELLIPTA 100-25 MCG/INH AEPB TAKE 1 PUFF BY MOUTH EVERY DAY 08/22/17   Elenora Gamma, MD  cefdinir (OMNICEF) 300 MG capsule Take 1 capsule (300 mg total) by mouth 2 (two) times daily. 1 po BID 11/22/17   Elenora Gamma, MD  doxycycline (VIBRA-TABS) 100 MG tablet Take 1 tablet (100 mg total) by mouth 2 (two) times daily. 11/18/17   Junie Spencer, FNP  ibuprofen (ADVIL,MOTRIN) 800 MG tablet Take 1 tablet (800 mg total) by mouth 3 (three) times daily. 12/28/17   Georgiana Shore, PA-C  meclizine (ANTIVERT) 25 MG tablet Take 1 tablet (25 mg total) by mouth 3 (three) times daily as needed for dizziness. 05/14/17   Raliegh Ip, DO  meloxicam (MOBIC) 15 MG tablet TAKE 1 TABLET BY MOUTH EVERY DAY WITH FOOD X 2 WEEKS, THEN AS NEEDED 04/30/17   Elenora Gamma, MD  methocarbamol (ROBAXIN) 500 MG tablet Take 1 tablet (500 mg total) by mouth at bedtime as needed. 12/28/17   Mathews Robinsons B, PA-C  mirtazapine (REMERON) 15 MG tablet Take 1 tablet (15 mg total) by mouth at bedtime. 11/22/17   Elenora Gamma, MD  montelukast (SINGULAIR) 10  MG tablet TAKE 1 TABLET (10 MG TOTAL) BY MOUTH AT BEDTIME. 03/27/17   Elenora GammaBradshaw, Samuel L, MD  ondansetron (ZOFRAN ODT) 4 MG disintegrating tablet Take 1 tablet (4 mg total) by mouth every 8 (eight) hours as needed for nausea or vomiting. 05/14/17   Delynn FlavinGottschalk, Ashly M, DO  pantoprazole (PROTONIX) 40 MG tablet TAKE 1 TABLET BY MOUTH EVERY DAY 11/19/17   Elenora GammaBradshaw, Samuel L, MD  TESTOSTERONE IM Inject into the muscle.    [provider]  zolpidem (AMBIEN) 10 MG tablet Take 1 tablet (10 mg total) by mouth at bedtime as needed for sleep. 11/22/17   Elenora GammaBradshaw, Samuel L, MD    Family History Family History  Problem Relation Age of Onset  . Hypertension  Mother   . Cancer Father        lung  . Hypertension Brother     Social History Social History   Tobacco Use  . Smoking status: Never Smoker  . Smokeless tobacco: Never Used  Substance Use Topics  . Alcohol use: Yes    Alcohol/week: 1.2 oz    Types: 2 Cans of beer per week    Comment: occasionally  . Drug use: No     Allergies   Patient has no known allergies.   Review of Systems Review of Systems  Eyes: Negative for visual disturbance.  Respiratory: Negative for chest tightness and shortness of breath.   Cardiovascular: Negative for chest pain and palpitations.  Gastrointestinal: Negative for abdominal pain, nausea and vomiting.  Musculoskeletal: Positive for arthralgias and joint swelling. Negative for neck pain and neck stiffness.  Skin: Negative for color change, pallor and wound.  Neurological: Negative for dizziness, syncope, weakness, numbness and headaches.     Physical Exam Updated Vital Signs BP 124/83 (BP Location: Right Arm)   Pulse 74   Temp 98.4 F (36.9 C) (Oral)   Resp 18   Ht 5\' 5"  (1.651 m)   Wt 99.8 kg (220 lb)   SpO2 96%   BMI 36.61 kg/m   Physical Exam  Constitutional: He appears well-developed and well-nourished. No distress.  Afebrile, nontoxic-appearing, and comfortably in bed no acute distress.  HENT:  Head: Normocephalic and atraumatic.  Eyes: Conjunctivae are normal.  Cardiovascular: Normal rate, regular rhythm, normal heart sounds and intact distal pulses.  No murmur heard. Pulmonary/Chest: Effort normal and breath sounds normal. No stridor. No respiratory distress. He has no wheezes. He has no rales.  Musculoskeletal: Normal range of motion. He exhibits edema and tenderness. He exhibits no deformity.  Patient has range of motion of the left knee.  Was able to stand temporarily but cannot put full weight on his left knee.  Subtle abnormality and laxity with anterior drawer testing.  Increased movement of the patella compared to  the left.  Otherwise knee joint appears grossly stable  Neurological: He is alert. No sensory deficit. He exhibits normal muscle tone.  5/5 strength in flexion extension.  Strong distal pulses.  Neurovascularly intact.  Skin: Skin is warm and dry. No rash noted. He is not diaphoretic. No erythema. No pallor.  Psychiatric: He has a normal mood and affect.  Nursing note and vitals reviewed.    ED Treatments / Results  Labs (all labs ordered are listed, but only abnormal results are displayed) Labs Reviewed - No data to display  EKG None  Radiology Dg Knee Complete 4 Views Left  Result Date: 12/28/2017 CLINICAL DATA:  Patient injured left knee during basketball. Pain with weight-bearing.  EXAM: LEFT KNEE - COMPLETE 4+ VIEW COMPARISON:  None. FINDINGS: Soft tissue swelling over the lateral aspect of the left knee. No joint effusion. Slight joint space narrowing of the femorotibial compartment with minimal spurring off the medial tibial plateau. No acute fracture or joint dislocation. Well corticated rounded ossifications are seen adjacent to the tibial tuberosity may reflect stigmata of old Osgood-Schlatter's. IMPRESSION: Mild soft tissue swelling over the lateral aspect of the left knee. No acute osseous appearing abnormality. No joint effusion. Electronically Signed   By: Tollie Eth M.D.   On: 12/28/2017 20:39    Procedures Procedures (including critical care time) SPLINT APPLICATION Date/Time: 11:31 PM Authorized by: Georgiana Shore Consent: Verbal consent obtained. Risks and benefits: risks, benefits and alternatives were discussed Consent given by: patient Splint applied by: nursing  Location details: left knee Splint type: knee immobilizer Supplies used: knee immobilizer Post-procedure: The splinted body part was neurovascularly unchanged following the procedure. Patient tolerance: Patient tolerated the procedure well with no immediate complications.    Medications  Ordered in ED Medications  ibuprofen (ADVIL,MOTRIN) tablet 800 mg (800 mg Oral Given 12/28/17 2211)     Initial Impression / Assessment and Plan / ED Course  I have reviewed the triage vital signs and the nursing notes.  Pertinent labs & imaging results that were available during my care of the patient were reviewed by me and considered in my medical decision making (see chart for details).    Patient presenting with acute left knee injury while playing basketball hyperextending.  Left knee appears to have more laxity with anterior drawer testing for ACL tear.  Neurovascularly intact.  Plain films negative for acute fracture or dislocation.  Patient's pain was managed while in the emergency department. Patient was provided with crutches and knee was placed in immobilizer. Rice protocol indicated and discussed with patient.  Discharge home with symptomatic relief and close follow-up with orthopedics. Return precautions discussed and patient understood and agreed with plan.  Final Clinical Impressions(s) / ED Diagnoses   Final diagnoses:  Sprain of left knee, unspecified ligament, initial encounter    ED Discharge Orders        Ordered    ibuprofen (ADVIL,MOTRIN) 800 MG tablet  3 times daily     12/28/17 2152    methocarbamol (ROBAXIN) 500 MG tablet  At bedtime PRN     12/28/17 2152       Georgiana Shore, PA-C 12/28/17 2332    Bethann Berkshire, MD 12/29/17 [phone]

## 2017-12-28 NOTE — Discharge Instructions (Signed)
As discussed, follow the rice protocol outlined in these instructions.  Use crutches to ambulate to keep weight off of your knee. Take ibuprofen every 8 hours for pain. Robaxin can help muscle spasm but should not be taken if driving or operating machinery.  Follow-up with orthopedic and and PCP.  Return if symptoms worsen or new concerning symptoms in the meantime.

## 2018-01-02 ENCOUNTER — Ambulatory Visit (INDEPENDENT_AMBULATORY_CARE_PROVIDER_SITE_OTHER): Payer: BLUE CROSS/BLUE SHIELD | Admitting: Orthopaedic Surgery

## 2018-01-02 ENCOUNTER — Encounter (INDEPENDENT_AMBULATORY_CARE_PROVIDER_SITE_OTHER): Payer: Self-pay | Admitting: Orthopaedic Surgery

## 2018-01-02 VITALS — Ht 65.0 in | Wt 220.0 lb

## 2018-01-02 DIAGNOSIS — M25062 Hemarthrosis, left knee: Secondary | ICD-10-CM | POA: Diagnosis not present

## 2018-01-02 MED ORDER — LIDOCAINE HCL 1 % IJ SOLN
0.5000 mL | INTRAMUSCULAR | Status: AC | PRN
Start: 2018-01-02 — End: 2018-01-02
  Administered 2018-01-02: .5 mL

## 2018-01-02 NOTE — Progress Notes (Signed)
Office Visit Note   Patient: Chase Stout           Date of Birth: 07/15/1981           MRN: [id number] Visit Date: 01/02/2018              Requested by: Chase Gamma, MD [address] [place], [place] [address] PCP: Chase Gamma, MD   Assessment & Plan: Visit Diagnoses:  1. Hemarthrosis, left knee     Plan: Patient had acute knee injury he is not able to stand on his leg without significant pain.  Aspiration shows hemarthrosis with dark bloody tap.  We will proceed with MRI scan to rule out peripheral meniscal tear that could be reparable.  By exam it does not appear that this is an ACL tear.  It is possible he may have a partial ACL tear.  Office follow-up after scan for review.  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  Office follow-up after scan.  Follow-Up Instructions: return after left knee MRI  Orders:  Orders Placed This Encounter  Procedures  . MR Knee Left w/o contrast   No orders of the defined types were placed in this encounter.     Procedures: Large Joint Inj: L knee on 01/02/2018 2:39 PM Indications: joint swelling and pain Details: 22 G 1.5 in needle, superolateral approach  Arthrogram: No  Medications: 0.5 mL lidocaine 1 % Aspirate: 30 mL bloody Outcome: tolerated well, no immediate complications Procedure, treatment alternatives, risks and benefits explained, specific risks discussed. Consent was given by the patient. Immediately prior to procedure a time out was called to verify the correct patient, procedure, equipment, support staff and site/side marked as required. Patient was prepped and draped in the usual sterile fashion.       Clinical Data: No additional findings.   Subjective: Chief Complaint  Patient presents with  . 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  He said severe pain with ambulation immediate knee swelling and is been using a knee immobilizer and crutches.  Radiographs are available on PACS which were negative for fracture.  He has been using ibuprofen, elevation, crutches and ice.  No previous history of injury to his knee.  Patient denies groin pain no numbness or tingling to his feet or ankles.  Review of Systems positive for problems with his rotator cuff and some osteoarthritis of his left shoulder.  Positive for asthma, increased BMI, GERD and vasomotor rhinitis otherwise negative as it pertains to HPI 14 point systems updated.   Objective: Vital Signs: Ht  (1.651 m)   Wt 220 lb (99.8 kg)   BMI 36.61 kg/m   Physical Exam  Constitutional: He is oriented to person, place, and time. He appears well-developed and well-nourished.  HENT:  Head: Normocephalic and atraumatic.  Eyes: Pupils are equal, round, and reactive to light. EOM are normal.  Neck: No tracheal deviation present. No thyromegaly present.  Cardiovascular: Normal rate.  Pulmonary/Chest: Effort normal. He has no wheezes.  Abdominal: Soft. Bowel sounds are normal.  Neurological: He is alert and oriented to person, place, and time.  Skin: Skin is warm and dry. Capillary refill takes less than 2 seconds.  Psychiatric: He has a normal mood and affect. His behavior  is normal. Judgment and thought content normal.    Ortho Exam patient has acute knee swelling possible hemarthrosis.  Opposite right knee shows normal ligament stability.  There is trace Lockman negative anterior drawer.  Collateral ligaments are stable.  He is tender along the medial joint line.  Palpation of the medial patellofemoral ligament is nontender.  Distal pulses are intact sensation of the foot is normal no pain with hip range of motion.  He can do a straight leg raise.  Quad patella and patellar tendon are intact.  No tenderness over the pes bursa.  Tib-fib joint is normal  proximally.  Specialty Comments:  No specialty comments available.  Imaging: No results found.   PMFS History: Patient Active Problem List   Diagnosis Date Noted  . GERD (gastroesophageal reflux disease) 06/14/2015  . Asthma 04/29/2015  . Obesity 04/29/2015  . Vasomotor rhinitis 09/01/2014  . Insomnia 11/22/2012  . Anxiety 11/22/2012  . Rotator cuff dysfunction 06/18/2012  . Osteoarthritis of left shoulder 06/18/2012  . Shoulder pain, left 06/18/2012   Past Medical History:  Diagnosis Date  . Allergy   . Hydrocele 01/31/1999   small hydroceles bilaterally, small calcified loose body in left hemiscrotum  . Kidney stones   . Osteoarthritis of left shoulder 06/18/2012  . Sinus infection     Family History  Problem Relation Age of Onset  . Hypertension Mother   . Cancer Father        lung  . Hypertension Brother     Past Surgical History:  Procedure Laterality Date  . EYE SURGERY Bilateral    Lasix  . LITHOTRIPSY    . NASAL SINUS SURGERY    . SHOULDER HEMI-ARTHROPLASTY  09/24/2012   Procedure: SHOULDER HEMI-ARTHROPLASTY;  Surgeon: Eulas Post, MD;  Location: [facility] OR;  Service: Orthopedics;  Laterality: Left;   Social History   Occupational History    Employer: [employer]  Tobacco Use  . Smoking status: Never Smoker  . Smokeless tobacco: Never Used  Substance and Sexual Activity  . Alcohol use: Yes    Alcohol/week: 1.2 oz    Types: 2 Cans of beer per week    Comment: occasionally  . Drug use: No  . Sexual activity: Not on file

## 2018-01-03 ENCOUNTER — Ambulatory Visit
Admission: RE | Admit: 2018-01-03 | Discharge: 2018-01-03 | Disposition: A | Discharge status: Home / Self Care | Payer: BLUE CROSS/BLUE SHIELD | Source: Ambulatory Visit | Attending: Orthopaedic Surgery | Admitting: Orthopaedic Surgery

## 2018-01-03 DIAGNOSIS — S83512A Sprain of anterior cruciate ligament of left knee, initial encounter: Secondary | ICD-10-CM | POA: Diagnosis not present

## 2018-01-03 DIAGNOSIS — M25062 Hemarthrosis, left knee: Secondary | ICD-10-CM

## 2018-01-07 ENCOUNTER — Encounter (INDEPENDENT_AMBULATORY_CARE_PROVIDER_SITE_OTHER): Payer: Self-pay | Admitting: Orthopaedic Surgery

## 2018-01-07 ENCOUNTER — Ambulatory Visit (INDEPENDENT_AMBULATORY_CARE_PROVIDER_SITE_OTHER): Payer: BLUE CROSS/BLUE SHIELD | Admitting: Orthopaedic Surgery

## 2018-01-07 VITALS — BP 130/84 | HR 81 | Ht 65.0 in | Wt 220.0 lb

## 2018-01-07 DIAGNOSIS — S83512D Sprain of anterior cruciate ligament of left knee, subsequent encounter: Secondary | ICD-10-CM

## 2018-01-07 DIAGNOSIS — S83242D Other tear of medial meniscus, current injury, left knee, subsequent encounter: Secondary | ICD-10-CM | POA: Diagnosis not present

## 2018-01-07 NOTE — Progress Notes (Signed)
Office Visit Note   Patient: Chase Stout           Date of Birth: Sep 05, 1980           MRN: [id number] Visit Date: 01/07/2018              Requested by: Elenora Gamma, MD [address] [place], [place] [address] PCP: Elenora Gamma, MD   Assessment & Plan: Visit Diagnoses:  1. Complete tear of anterior cruciate ligament of left knee, subsequent encounter   2. Acute medial meniscus tear of left knee, subsequent encounter     Plan: We reviewed the MRI scan again copy of the report.  We discussed options including no repair versus surgical repair.  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  He needs to get his knee motion back working on flexion and continue with quad straight leg raising exercises.  We reviewed the MRI scan which shows the bone contusions of the tibial plateau which will take some time to resolve.  As soon as he gets his motion back and his knee swelling is down then he will be ready to proceed with ACL reconstruction.  We looked at some knee models discussed reconstruction techniques.  Questions were elicited and answered.  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  Recheck 2 weeks and then we can have a good idea when his swelling is down enough that reconstruction procedure can be scheduled.  Work slip given for okay for desk work and I added that patient has an anterior cruciate ligament tear will need reconstruction surgery.  Follow-Up Instructions: Return in about 2 weeks (around 01/21/2018).   Orders:  No orders of the defined types were placed in this encounter.  No orders of the defined types were placed in this encounter.     Procedures: No procedures performed   Clinical Data: No additional findings.   Subjective: Chief Complaint  Patient presents with  . Left Knee - Follow-up    S/p MRI    HPI 37 year old [place]  Stout returns post MRI  scan of his left knee.  This shows acute complete tear of the ACL with associated bone bruises posteriorly on the tibial plateau.  Oblique tear of the posterior horn the meniscus which is not in the zone that can be repaired.  Grade 1 MCL sprain was noted.  He has been using his knee immobilizer working on straight leg raising and is still using crutches.  He has pain with weightbearing.  He states there is limited desk work available at work but when they need him he can fill in. Review of Systems updated unchanged from last office visit.   Objective: Vital Signs: BP 130/84 (BP Location: Left Arm, Patient Position: Sitting, Cuff Size: Large)   Pulse 81   Ht  (1.651 m)   Wt 220 lb (99.8 kg)   BMI 36.61 kg/m   Physical Exam  Constitutional: He is oriented to person, place, and time. He appears well-developed and well-nourished.  HENT:  Head: Normocephalic and atraumatic.  Eyes: Pupils are equal, round, and reactive to light. EOM are normal.  Neck: No tracheal deviation present. No thyromegaly present.  Cardiovascular: Normal rate.  Pulmonary/Chest: Effort normal. He has no wheezes.  Abdominal: Soft. Bowel sounds are normal.  Neurological: He is alert and oriented to person, place, and  time.  Skin: Skin is warm and dry. Capillary refill takes less than 2 seconds.  Psychiatric: He has a normal mood and affect. His behavior is normal. Judgment and thought content normal.    Ortho Exam patient has 1+ Lockman.  Knee hemarthrosis is decreased about 50%.  Collateral is mildly tender at the femoral attachment he still does not open up medially.  Specialty Comments:  No specialty comments available.  Imaging: CLINICAL DATA:  Left knee injury playing basketball 1 week ago. Knee pain with swelling and weakness. Bloody arthrocentesis.  EXAM: MRI OF THE LEFT KNEE WITHOUT CONTRAST  TECHNIQUE: Multiplanar, multisequence MR imaging of the knee was performed. No intravenous contrast was  administered.  COMPARISON:  Radiographs 12/28/2017.  FINDINGS: MENISCI  Medial meniscus: There is an oblique undersurface tear of the posterior horn, best seen on sagittal images 17-19. The meniscal root is intact. No displaced meniscal fragment identified.  Lateral meniscus:  Mild intrasubstance signal without definite tear.  LIGAMENTS  Cruciates: There is an acute complete tear of the anterior cruciate ligament proximally. There is mild laxity of the ligament with hemorrhage in the intercondylar notch. The PCL appears normal.  Collaterals: There is moderate edema surrounding the medial collateral ligament which is intact, consistent with a grade 1 MCL injury. The lateral collateral ligament complex appears normal.  CARTILAGE  Patellofemoral:  Preserved.  Medial:  Preserved.  Lateral:  Preserved.  MISCELLANEOUS  Joint: Moderate complex joint effusion with possible debris. No layering intra-articular fat or definite loose bodies.  Popliteal Fossa: Small Baker's cyst. There is soft tissue edema within the popliteal fossa as well.  Extensor Mechanism: Intact. There are small ossicles adjacent to the tibial tubercle.  Bones: There are prominent bone contusions of both tibial plateaus posteriorly. There are minimal bone contusions of both femoral condyles peripherally. No osteochondral fracture identified.  Other: No other significant periarticular soft tissue findings.  IMPRESSION: 1. Acute complete tear of the anterior cruciate ligament. 2. Associated bone contusions of both tibial plateaus and femoral condyles consistent with pivot-shift injury. 3. Oblique tear of the posterior horn of the medial meniscus. The lateral meniscus demonstrates mild intrasubstance signal, but no discrete tear. 4. Grade 1 MCL sprain. 5. Hemarthrosis and small Baker's cyst.   Electronically Signed   By: Carey Bullocks M.D.   On: 01/04/2018 09:42    PMFS  History: Patient Active Problem List   Diagnosis Date Noted  . GERD (gastroesophageal reflux disease) 06/14/2015  . Asthma 04/29/2015  . Obesity 04/29/2015  . Vasomotor rhinitis 09/01/2014  . Insomnia 11/22/2012  . Anxiety 11/22/2012  . Rotator cuff dysfunction 06/18/2012  . Osteoarthritis of left shoulder 06/18/2012  . Shoulder pain, left 06/18/2012   Past Medical History:  Diagnosis Date  . Allergy   . Hydrocele 01/31/1999   small hydroceles bilaterally, small calcified loose body in left hemiscrotum  . Kidney stones   . Osteoarthritis of left shoulder 06/18/2012  . Sinus infection     Family History  Problem Relation Age of Onset  . Hypertension Mother   . Cancer Father        lung  . Hypertension Brother     Past Surgical History:  Procedure Laterality Date  . EYE SURGERY Bilateral    Lasix  . LITHOTRIPSY    . NASAL SINUS SURGERY    . SHOULDER HEMI-ARTHROPLASTY  09/24/2012   Procedure: SHOULDER HEMI-ARTHROPLASTY;  Surgeon: Eulas Post, MD;  Location: [facility] OR;  Service: Orthopedics;  Laterality: Left;  Social History   Occupational History    Employer: [employer]  Tobacco Use  . Smoking status: Never Smoker  . Smokeless tobacco: Never Used  Substance and Sexual Activity  . Alcohol use: Yes    Alcohol/week: 1.2 oz    Types: 2 Cans of beer per week    Comment: occasionally  . Drug use: No  . Sexual activity: Not on file

## 2018-01-08 DIAGNOSIS — R5383 Other fatigue: Secondary | ICD-10-CM | POA: Diagnosis not present

## 2018-01-08 DIAGNOSIS — R891 Abnormal level of hormones in specimens from other organs, systems and tissues: Secondary | ICD-10-CM | POA: Diagnosis not present

## 2018-01-10 ENCOUNTER — Ambulatory Visit (INDEPENDENT_AMBULATORY_CARE_PROVIDER_SITE_OTHER): Payer: BLUE CROSS/BLUE SHIELD | Admitting: *Deleted

## 2018-01-10 DIAGNOSIS — Z23 Encounter for immunization: Secondary | ICD-10-CM | POA: Diagnosis not present

## 2018-01-10 NOTE — Progress Notes (Signed)
Twinrix vaccine given for [employer] [facility] tolerated well 

## 2018-01-24 ENCOUNTER — Ambulatory Visit (INDEPENDENT_AMBULATORY_CARE_PROVIDER_SITE_OTHER): Payer: BLUE CROSS/BLUE SHIELD | Admitting: Orthopaedic Surgery

## 2018-01-24 ENCOUNTER — Encounter (INDEPENDENT_AMBULATORY_CARE_PROVIDER_SITE_OTHER): Payer: Self-pay | Admitting: Orthopaedic Surgery

## 2018-01-24 ENCOUNTER — Other Ambulatory Visit: Payer: Self-pay | Admitting: Family Medicine

## 2018-01-24 VITALS — BP 115/69 | HR 72 | Ht 65.0 in | Wt 220.0 lb

## 2018-01-24 DIAGNOSIS — S83512D Sprain of anterior cruciate ligament of left knee, subsequent encounter: Secondary | ICD-10-CM

## 2018-01-24 DIAGNOSIS — S83512A Sprain of anterior cruciate ligament of left knee, initial encounter: Secondary | ICD-10-CM | POA: Insufficient documentation

## 2018-01-24 NOTE — Progress Notes (Signed)
Office Visit Note   Patient: Chase Stout           Date of Birth: 1981/04/22           MRN: [id number] Visit Date: 01/24/2018              Requested by: Elenora Gamma, MD [address] [place], [place] [address] PCP: Elenora Gamma, MD   Assessment & Plan: Visit Diagnoses:  1. Rupture of anterior cruciate ligament of left knee, subsequent encounter     Plan: Patient is ready for surgical scheduling for outpatient ACL reconstruction with partial medial meniscectomy likely and arthroscopic anterior cruciate ligament reconstruction.  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  He understands and will be several months before he is able to resume full duty as a Emergency planning/management officer due to the high demand requirements of the job.  We discussed problems with knee stiffness potential for possible graft rupture reoperation, risk of infection, likely preoperative block plus general anesthesia, overnight stay and physical therapy starting 1 week postop.  Questions were elicited and answered.  Patient was here with his wife who contributed to the discussion.  They understand and he requests that we proceed.  Follow-Up Instructions: No follow-ups on file.   Orders:  No orders of the defined types were placed in this encounter.  No orders of the defined types were placed in this encounter.     Procedures: No procedures performed   Clinical Data: No additional findings.   Subjective: Chief Complaint  Patient presents with  . 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  He is worked on knee range of motion swelling is decreased and he returns today  and states he is ready to proceed with surgical scheduling.  He has been working doing modified light duty.  He has full extension flexes to 125 degrees and has only trace knee swelling.  He is used ibuprofen elevation.  Onset of pain was immediate at the time of his injury on 12/28/2017.  Review of Systems positive for previous problems with his rotator cuff some osteoarthritis left shoulder.  Positive for asthma increased BMI, GERD, vasomotor rhinitis otherwise -14 point review of systems updated.   Objective: Vital Signs: BP 115/69   Pulse 72   Ht  (1.651 m)   Wt 220 lb (99.8 kg)   BMI 36.61 kg/m   Physical Exam  Constitutional: He is oriented to person, place, and time. He appears well-developed and well-nourished.  HENT:  Head: Normocephalic and atraumatic.  Eyes: Pupils are equal, round, and reactive to light. EOM are normal.  Neck: No tracheal deviation present. No thyromegaly present.  Cardiovascular: Normal rate.  Pulmonary/Chest: Effort normal. He has no wheezes.  Abdominal: Soft. Bowel sounds are normal.  Neurological: He is alert and oriented to person, place, and time.  Skin: Skin is warm and dry. Capillary refill takes less than 2 seconds.  Psychiatric: He has a normal mood and affect. His behavior is normal. Judgment and thought content normal.    Ortho Exam knee trace swelling.  Positive Lockman positive anterior  drawer positive pivot shift test.  Minimal posterior medial joint line tenderness.  Normal patellar tracking.  Distal pulses are intact no sciatic notch tenderness normal hip range of motion.  Opposite right knee shows normal stability.  Specialty Comments:  No specialty comments available.  Imaging: CLINICAL DATA:  Left knee injury playing basketball 1 week ago. Knee pain with swelling and weakness. Bloody arthrocentesis.  EXAM: MRI OF THE LEFT KNEE WITHOUT CONTRAST  TECHNIQUE: Multiplanar, multisequence MR imaging of the knee was performed.  No intravenous contrast was administered.  COMPARISON:  Radiographs 12/28/2017.  FINDINGS: MENISCI  Medial meniscus: There is an oblique undersurface tear of the posterior horn, best seen on sagittal images 17-19. The meniscal root is intact. No displaced meniscal fragment identified.  Lateral meniscus:  Mild intrasubstance signal without definite tear.  LIGAMENTS  Cruciates: There is an acute complete tear of the anterior cruciate ligament proximally. There is mild laxity of the ligament with hemorrhage in the intercondylar notch. The PCL appears normal.  Collaterals: There is moderate edema surrounding the medial collateral ligament which is intact, consistent with a grade 1 MCL injury. The lateral collateral ligament complex appears normal.  CARTILAGE  Patellofemoral:  Preserved.  Medial:  Preserved.  Lateral:  Preserved.  MISCELLANEOUS  Joint: Moderate complex joint effusion with possible debris. No layering intra-articular fat or definite loose bodies.  Popliteal Fossa: Small Baker's cyst. There is soft tissue edema within the popliteal fossa as well.  Extensor Mechanism: Intact. There are small ossicles adjacent to the tibial tubercle.  Bones: There are prominent bone contusions of both tibial plateaus posteriorly. There are minimal bone contusions of both femoral condyles peripherally. No osteochondral fracture identified.  Other: No other significant periarticular soft tissue findings.  IMPRESSION: 1. Acute complete tear of the anterior cruciate ligament. 2. Associated bone contusions of both tibial plateaus and femoral condyles consistent with pivot-shift injury. 3. Oblique tear of the posterior horn of the medial meniscus. The lateral meniscus demonstrates mild intrasubstance signal, but no discrete tear. 4. Grade 1 MCL sprain. 5. Hemarthrosis and small Baker's cyst.   Electronically Signed   By: Carey Bullocks M.D.   On:  01/04/2018 09:42    PMFS History: Patient Active Problem List   Diagnosis Date Noted  . GERD (gastroesophageal reflux disease) 06/14/2015  . Asthma 04/29/2015  . Obesity 04/29/2015  . Vasomotor rhinitis 09/01/2014  . Insomnia 11/22/2012  . Anxiety 11/22/2012  . Rotator cuff dysfunction 06/18/2012  . Osteoarthritis of left shoulder 06/18/2012  . Shoulder pain, left 06/18/2012   Past Medical History:  Diagnosis Date  . Allergy   . Hydrocele 01/31/1999   small hydroceles bilaterally, small calcified loose body in left hemiscrotum  . Kidney stones   . Osteoarthritis of left shoulder 06/18/2012  . Sinus infection     Family History  Problem Relation Age of Onset  . Hypertension Mother   . Cancer Father        lung  . Hypertension Brother     Past Surgical History:  Procedure Laterality Date  . EYE SURGERY Bilateral    Lasix  . LITHOTRIPSY    . NASAL SINUS SURGERY    . SHOULDER HEMI-ARTHROPLASTY  09/24/2012   Procedure: SHOULDER HEMI-ARTHROPLASTY;  Surgeon: Eulas Post, MD;  Location: [facility] OR;  Service: Orthopedics;  Laterality: Left;   Social History   Occupational History    Employer: [employer]  Tobacco Use  . Smoking status: Never Smoker  . Smokeless tobacco: Never  Used  Substance and Sexual Activity  . Alcohol use: Yes    Alcohol/week: 1.2 oz    Types: 2 Cans of beer per week    Comment: occasionally  . Drug use: No  . Sexual activity: Not on file

## 2018-01-29 ENCOUNTER — Other Ambulatory Visit: Payer: Self-pay | Admitting: Family Medicine

## 2018-01-29 DIAGNOSIS — J4531 Mild persistent asthma with (acute) exacerbation: Secondary | ICD-10-CM

## 2018-02-04 ENCOUNTER — Encounter: Payer: Self-pay | Admitting: Orthopaedic Surgery

## 2018-02-04 DIAGNOSIS — S83212A Bucket-handle tear of medial meniscus, current injury, left knee, initial encounter: Secondary | ICD-10-CM | POA: Diagnosis not present

## 2018-02-04 DIAGNOSIS — G8918 Other acute postprocedural pain: Secondary | ICD-10-CM | POA: Diagnosis not present

## 2018-02-04 DIAGNOSIS — X58XXXA Exposure to other specified factors, initial encounter: Secondary | ICD-10-CM | POA: Diagnosis not present

## 2018-02-04 DIAGNOSIS — Y998 Other external cause status: Secondary | ICD-10-CM | POA: Diagnosis not present

## 2018-02-04 DIAGNOSIS — S83242A Other tear of medial meniscus, current injury, left knee, initial encounter: Secondary | ICD-10-CM | POA: Diagnosis not present

## 2018-02-04 DIAGNOSIS — S83512A Sprain of anterior cruciate ligament of left knee, initial encounter: Secondary | ICD-10-CM | POA: Diagnosis not present

## 2018-02-04 HISTORY — PX: KNEE ARTHROSCOPY: SUR90

## 2018-02-12 ENCOUNTER — Ambulatory Visit (INDEPENDENT_AMBULATORY_CARE_PROVIDER_SITE_OTHER): Payer: BLUE CROSS/BLUE SHIELD | Admitting: Orthopaedic Surgery

## 2018-02-12 ENCOUNTER — Encounter (INDEPENDENT_AMBULATORY_CARE_PROVIDER_SITE_OTHER): Payer: Self-pay | Admitting: Orthopaedic Surgery

## 2018-02-12 ENCOUNTER — Ambulatory Visit (INDEPENDENT_AMBULATORY_CARE_PROVIDER_SITE_OTHER): Payer: BLUE CROSS/BLUE SHIELD

## 2018-02-12 VITALS — BP 128/85 | HR 76 | Ht 65.0 in | Wt 220.0 lb

## 2018-02-12 DIAGNOSIS — Z9889 Other specified postprocedural states: Secondary | ICD-10-CM

## 2018-02-12 DIAGNOSIS — S83512D Sprain of anterior cruciate ligament of left knee, subsequent encounter: Secondary | ICD-10-CM | POA: Diagnosis not present

## 2018-02-12 NOTE — Progress Notes (Signed)
   Post-Op Visit Note   Patient: Chase Stout           Date of Birth: 01/14/1981           MRN: [id number] Visit Date: 02/12/2018 PCP: Elenora GammaBradshaw, Samuel L, MD   Assessment & Plan: Patient had sutures removed.  [facility]  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  He will need to make his therapy visits.  I plan to recheck him in a month.  There is not mild knee swelling.  We discussed intraoperative photos from the partial medial meniscectomy and ACL reconstruction.  Chief Complaint:  Chief Complaint  Patient presents with  . Left Knee - Routine Post Op   Visit Diagnoses:  1. Rupture of anterior cruciate ligament of left knee, subsequent encounter   2. S/P ACL reconstruction     Plan: Start physical therapy.  He can do a leg lift with the 10 degrees to 15 degree extension lag currently.  Swelling is mild incision looks good.  Anterior drawer and Lachman is negative.  Follow-Up Instructions: Return in about 1 month (around 03/14/2018).   Orders:  Orders Placed This Encounter  Procedures  . XR Knee 1-2 Views Left  . Ambulatory referral to Physical Therapy   No orders of the defined types were placed in this encounter.   Imaging: Xr Knee 1-2 Views Left  Result Date: 02/12/2018 2 view x-rays left knee demonstrate good position of ACL tunnels in good screw position. Impression: Postop left knee ACL reconstruction with interference screws in good location in both tunnels.   PMFS History: Patient Active Problem List   Diagnosis Date Noted  . S/P ACL reconstruction 02/12/2018  . Left anterior cruciate ligament tear 01/24/2018  . GERD (gastroesophageal reflux disease) 06/14/2015  . Asthma 04/29/2015  . Obesity 04/29/2015  . Vasomotor rhinitis 09/01/2014  . Insomnia 11/22/2012  . Anxiety 11/22/2012  . Rotator cuff dysfunction 06/18/2012  . Osteoarthritis of left shoulder 06/18/2012  . Shoulder pain,  left 06/18/2012   Past Medical History:  Diagnosis Date  . Allergy   . Hydrocele 01/31/1999   small hydroceles bilaterally, small calcified loose body in left hemiscrotum  . Kidney stones   . Osteoarthritis of left shoulder 06/18/2012  . Sinus infection     Family History  Problem Relation Age of Onset  . Hypertension Mother   . Cancer Father        lung  . Hypertension Brother     Past Surgical History:  Procedure Laterality Date  . EYE SURGERY Bilateral    Lasix  . KNEE ARTHROSCOPY    . LITHOTRIPSY    . NASAL SINUS SURGERY    . SHOULDER HEMI-ARTHROPLASTY  09/24/2012   Procedure: SHOULDER HEMI-ARTHROPLASTY;  Surgeon: Eulas PostJoshua P Landau, MD;  [facility];  Service: Orthopedics;  Laterality: Left;   Social History   Occupational History    Employer: [employer]  Tobacco Use  . Smoking status: Never Smoker  . Smokeless tobacco: Never Used  Substance and Sexual Activity  . Alcohol use: Yes    Alcohol/week: 1.2 oz    Types: 2 Cans of beer per week    Comment: occasionally  . Drug use: No  . Sexual activity: Not on file

## 2018-02-14 ENCOUNTER — Other Ambulatory Visit: Payer: Self-pay

## 2018-02-14 ENCOUNTER — Ambulatory Visit: Payer: BLUE CROSS/BLUE SHIELD | Attending: Orthopaedic Surgery | Admitting: Physical Therapy

## 2018-02-14 ENCOUNTER — Encounter: Payer: Self-pay | Admitting: Physical Therapy

## 2018-02-14 DIAGNOSIS — M6281 Muscle weakness (generalized): Secondary | ICD-10-CM | POA: Insufficient documentation

## 2018-02-14 DIAGNOSIS — M25662 Stiffness of left knee, not elsewhere classified: Secondary | ICD-10-CM | POA: Insufficient documentation

## 2018-02-14 DIAGNOSIS — M25562 Pain in left knee: Secondary | ICD-10-CM | POA: Insufficient documentation

## 2018-02-14 DIAGNOSIS — R600 Localized edema: Secondary | ICD-10-CM | POA: Insufficient documentation

## 2018-02-14 NOTE — Therapy (Signed)
[facility] [address] [place], [place], [address] Phone: [phone]   Fax:  [phone]  Physical Therapy Evaluation  Patient Details  Name: Chase Stout MRN: [id number] Date of Birth: August 14, 1981 Referring Provider: Annell Greening MD   Encounter Date: 02/14/2018  PT End of Session - 02/14/18 1500    Visit Number  1    Number of Visits  18    Date for PT Re-Evaluation  04/18/18    Authorization Type  FOTO AT LEAST EVERY 5TH VISIT, 10TH VISIT PROGRESS NOTE.    PT Start Time  1115    PT Stop Time  1211    PT Time Calculation (min)  56 min    Activity Tolerance  Patient tolerated treatment well    Behavior During Therapy  WFL for tasks assessed/performed       Past Medical History:  Diagnosis Date  . Allergy   . Hydrocele 01/31/1999   small hydroceles bilaterally, small calcified loose body in left hemiscrotum  . Kidney stones   . Osteoarthritis of left shoulder 06/18/2012  . Sinus infection     Past Surgical History:  Procedure Laterality Date  . EYE SURGERY Bilateral    Lasix  . KNEE ARTHROSCOPY    . LITHOTRIPSY    . NASAL SINUS SURGERY    . SHOULDER HEMI-ARTHROPLASTY  09/24/2012   Procedure: SHOULDER HEMI-ARTHROPLASTY;  Surgeon: Eulas Post, MD;  Location: [facility] OR;  Service: Orthopedics;  Laterality: Left;    There were no vitals filed for this visit.   Subjective Assessment - 02/14/18 1503    Subjective  The patient underwent a left ACL reconstruction and partial medial meniscectomy on 02/04/18.  he prsents to the clinic today with a left knee immobilizer on and bilateral axillary crutches and PWB over his left LE.  He can being weaning from crutuches at this time.  His pain-level today is a 5/10.  Elevation and medication decrease his pain and walking increases his pain.    Patient Stated Goals  Get back to full duty work.    Currently in Pain?  Yes    Pain Score  5     Pain Location  Knee    Pain Orientation   Left    Pain Descriptors / Indicators  Throbbing    Pain Type  Surgical pain    Pain Onset  1 to 4 weeks ago    Pain Frequency  Constant    Aggravating Factors   See above.    Pain Relieving Factors  See above.         [facility] PT Assessment - 02/14/18 0001      Assessment   Medical Diagnosis  Rupture of ACL, left knee.    Referring Provider  Annell Greening MD    Onset Date/Surgical Date  -- 02/04/18 (surgery date).      Precautions   Precautions  -- Per protocol.      Restrictions   Weight Bearing Restrictions  -- Into the clinic today PWBing.      Balance Screen   Has the patient fallen in the past 6 months  No    Has the patient had a decrease in activity level because of a fear of falling?   No    Is the patient reluctant to leave their home because of a fear of falling?   No      Home Public house manager residence  Prior Function   Level of Independence  Independent      Observation/Other Assessments   Observations  Steri-strips intact over left knee.    Focus on Therapeutic Outcomes (FOTO)   82% limitation.      Observation/Other Assessments-Edema    Edema  Circumferential      Circumferential Edema   Circumferential - Left   LT 4 cms > RT.      ROM / Strength   AROM / PROM / Strength  Strength      PROM   Overall PROM Comments  PAROM into left knee extension= -4 degrees with flexion to 80 degrees.      Strength   Overall Strength Comments  Left hip flexion required assistance with a grade of 3-/5.  Decreased volitional contraction of left quadriceps in supine.      Palpation   Palpation comment  Diffuse palpable tenderness in area of patellar incisional site.      Special Tests   Other special tests  Essentially normal left patellar mobility.      Ambulation/Gait   Gait Comments  The patient ambulating with bilaterally axillary crutches and PWBing over his left LE with a knee immobilizer donned.                Objective  measurements completed on examination: See above findings.      [facility] Adult PT Treatment/Exercise - 02/14/18 0001      Exercises   Exercises  Knee/Hip      Knee/Hip Exercises: Supine   Quad Sets Limitations  Facilitated with VMS to left quadriceps x 16 minutes for neuro-re-eduation with 10 sec holds and 10 sec rest.      Modalities   Modalities  Vasopneumatic      Vasopneumatic   Number Minutes Vasopneumatic   10 minutes    Vasopnuematic Location   -- Left knee.    Vasopneumatic Pressure  Medium             PT Education - 02/14/18 1527    Education Details  Prone hang.    Person(s) Educated  Patient    Methods  Explanation    Comprehension  Verbalized understanding          PT Long Term Goals - 02/14/18 1540      PT LONG TERM GOAL #1   Title  Independent with a HEP.    Time  9    Period  Weeks    Status  New      PT LONG TERM GOAL #2   Title  Full active left knee extension in order to normalize gait.    Time  9    Period  Weeks    Status  New      PT LONG TERM GOAL #3   Title  Active knee flexion to 115 degrees+ so the patient can perform functional tasks and do so with pain not > 2-3/10.    Time  9    Period  Weeks    Status  New      PT LONG TERM GOAL #4   Title  Increase right hip and  knee strength to a solid 4+/5 to provide good stability for accomplishment of functional activities.    Time  9    Period  Weeks    Status  New      PT LONG TERM GOAL #5   Title  Perform a reciprocating stair gait with one railing with pain not >  2-3/10.    Time  9    Period  Weeks    Status  New             Plan - 02/14/18 1534    Clinical Impression Statement  The patient presents to the clinic s/p left ACL reconstruction and partial medial meniscectomy performed on 02/04/18.  He is currently ambulating with bilateral axillary crutches, PWBing over his left LE with a knee immobilizer donned.  He demonstrates a loss of volitional activation of his left  quadriceps and a 10 degree extensor lag when he attempts to perform a left SLR.  He has an expected amount of edema as well.  The patient's pain and deficts is dramatically imapiring his functional mobility at this time. Patient will benefit from skilled physical therapy intervention to address pain and deficits per progression into an ACL reconstruction protocol.     Clinical Presentation  Stable    Clinical Presentation due to:  Good surgical outcome.    Clinical Decision Making  Low    Rehab Potential  Excellent    PT Frequency  -- 2-3 times a week.    PT Duration  -- 9 weeks.    PT Treatment/Interventions  ADLs/Self Care Home Management;Cryotherapy;Electrical Stimulation;Functional mobility training;Therapeutic activities;Therapeutic exercise;Neuromuscular re-education;Patient/family education;Passive range of motion;Manual techniques;Vasopneumatic Device    PT Next Visit Plan  VMS to left quadriceps; assure patient has full left knee extension.  Vasopneumatic.  Follow ACL protocol.    Consulted and Agree with Plan of Care  Patient       Patient will benefit from skilled therapeutic intervention in order to improve the following deficits and impairments:  Abnormal gait, Decreased activity tolerance, Decreased range of motion, Decreased strength, Increased edema, Pain  Visit Diagnosis: Acute pain of left knee - Plan: PT plan of care cert/re-cert  Localized edema - Plan: PT plan of care cert/re-cert  Muscle weakness (generalized) - Plan: PT plan of care cert/re-cert  Stiffness of left knee, not elsewhere classified - Plan: PT plan of care cert/re-cert     Problem List Patient Active Problem List   Diagnosis Date Noted  . S/P ACL reconstruction 02/12/2018  . Left anterior cruciate ligament tear 01/24/2018  . GERD (gastroesophageal reflux disease) 06/14/2015  . Asthma 04/29/2015  . Obesity 04/29/2015  . Vasomotor rhinitis 09/01/2014  . Insomnia 11/22/2012  . Anxiety 11/22/2012   . Rotator cuff dysfunction 06/18/2012  . Osteoarthritis of left shoulder 06/18/2012  . Shoulder pain, left 06/18/2012    Ziona Wickens, [place] MPT 02/14/2018, 3:52 PM  [facility] [address] [place], [place], [address] Phone: [phone]   Fax:  [phone]  Name: Chase Stout MRN: [id number] Date of Birth: 04-15-1981

## 2018-02-15 ENCOUNTER — Ambulatory Visit: Payer: BLUE CROSS/BLUE SHIELD | Admitting: Physical Therapy

## 2018-02-15 ENCOUNTER — Encounter: Payer: Self-pay | Admitting: Physical Therapy

## 2018-02-15 DIAGNOSIS — R600 Localized edema: Secondary | ICD-10-CM

## 2018-02-15 DIAGNOSIS — M25662 Stiffness of left knee, not elsewhere classified: Secondary | ICD-10-CM

## 2018-02-15 DIAGNOSIS — M6281 Muscle weakness (generalized): Secondary | ICD-10-CM

## 2018-02-15 DIAGNOSIS — M25562 Pain in left knee: Secondary | ICD-10-CM

## 2018-02-15 NOTE — Therapy (Signed)
[facility] [address] [place], [place], [address] Phone: [phone]   Fax:  [phone]  Physical Therapy Treatment  Patient Details  Name: Chase Gamblerhomas S Tamura MRN: [id number] Date of Birth: 04/29/1981 Referring Provider: Annell GreeningMark Yates MD   Encounter Date: 02/15/2018  PT End of Session - 02/15/18 0948    Visit Number  2    Number of Visits  18    Date for PT Re-Evaluation  04/18/18    Authorization Type  FOTO AT LEAST EVERY 5TH VISIT, 10TH VISIT PROGRESS NOTE.    PT Start Time  0945    PT Stop Time  1033    PT Time Calculation (min)  48 min    Activity Tolerance  Patient tolerated treatment well    Behavior During Therapy  WFL for tasks assessed/performed       Past Medical History:  Diagnosis Date  . Allergy   . Hydrocele 01/31/1999   small hydroceles bilaterally, small calcified loose body in left hemiscrotum  . Kidney stones   . Osteoarthritis of left shoulder 06/18/2012  . Sinus infection     Past Surgical History:  Procedure Laterality Date  . EYE SURGERY Bilateral    Lasix  . KNEE ARTHROSCOPY    . LITHOTRIPSY    . NASAL SINUS SURGERY    . SHOULDER HEMI-ARTHROPLASTY  09/24/2012   Procedure: SHOULDER HEMI-ARTHROPLASTY;  Surgeon: Eulas PostJoshua P Landau, MD;  [facility];  Service: Orthopedics;  Laterality: Left;    There were no vitals filed for this visit.  Subjective Assessment - 02/15/18 0947    Subjective  Reports that he doesn't really have much pain today but does have some incision discomfort.    Patient Stated Goals  Get back to full duty work.    Currently in Pain?  No/denies         [facility] - 02/15/18 0001      Assessment   Medical Diagnosis  Rupture of ACL, left knee.    Onset Date/Surgical Date  02/04/18    Next MD Visit  03/14/2018                   [facility] Treatment/Exercise - 02/15/18 0001      Knee/Hip Exercises: Aerobic   Nustep  L1, seat 12-10 x15 min       Knee/Hip Exercises: Supine   Quad Sets  Left;Limitations    Quad Sets Limitations  VMS facilitated QS to L VMO/Quad to facilitate neuro re-ed of L quad for volitional contraction      Modalities   Modalities  Electrical Stimulation;Vasopneumatic      Electrical Stimulation   Electrical Stimulation Location  L VMO/Quad; L knee    Electrical Stimulation Action  VMS with QS; Pre-Mod    Electrical Stimulation Parameters  10/10, 50 pps, 300 usec, 5 sec ramp x10 min; 80-150 hz x15 min    Electrical Stimulation Goals  Neuromuscular facilitation;Edema;Pain      Vasopneumatic   Number Minutes Vasopneumatic   15 minutes    Vasopnuematic Location   Knee    Vasopneumatic Pressure  Low    Vasopneumatic Temperature   34             PT Education - 02/14/18 1527    Education Details  Prone hang.    Person(s) Educated  Patient    Methods  Explanation    Comprehension  Verbalized understanding  PT Long Term Goals - 02/14/18 1540      PT LONG TERM GOAL #1   Title  Independent with a HEP.    Time  9    Period  Weeks    Status  New      PT LONG TERM GOAL #2   Title  Full active left knee extension in order to normalize gait.    Time  9    Period  Weeks    Status  New      PT LONG TERM GOAL #3   Title  Active knee flexion to 115 degrees+ so the patient can perform functional tasks and do so with pain not > 2-3/10.    Time  9    Period  Weeks    Status  New      PT LONG TERM GOAL #4   Title  Increase right hip and  knee strength to a solid 4+/5 to provide good stability for accomplishment of functional activities.    Time  9    Period  Weeks    Status  New      PT LONG TERM GOAL #5   Title  Perform a reciprocating stair gait with one railing with pain not > 2-3/10.    Time  9    Period  Weeks    Status  New            Plan - 02/15/18 1021    Clinical Impression Statement  Patient tolerated first treatment post evaluation well with no complaints of any  increased pain. Patient still utilizing immobilizer as well as unilateral crutch at this time. Steristrips still in place over L knee incisions. Very light resistance utilized on Nustep today as seat advanced three times to allow for ROM. VMS completed today as well with QS with visible quad fatigue noted with contraction. Increased edema observed surrounding the L knee. Normal modalities response noted following removal of the modalities. Goals remain on-going secondary to initial beginnings of PT at this time.    Rehab Potential  Excellent    PT Treatment/Interventions  ADLs/Self Care Home Management;Cryotherapy;Electrical Stimulation;Functional mobility training;Therapeutic activities;Therapeutic exercise;Neuromuscular re-education;Patient/family education;Passive range of motion;Manual techniques;Vasopneumatic Device    PT Next Visit Plan  VMS to left quadriceps; assure patient has full left knee extension.  Vasopneumatic.  Follow ACL protocol.    Consulted and Agree with Plan of Care  Patient       Patient will benefit from skilled therapeutic intervention in order to improve the following deficits and impairments:  Abnormal gait, Decreased activity tolerance, Decreased range of motion, Decreased strength, Increased edema, Pain  Visit Diagnosis: Acute pain of left knee  Localized edema  Muscle weakness (generalized)  Stiffness of left knee, not elsewhere classified     Problem List Patient Active Problem List   Diagnosis Date Noted  . S/P ACL reconstruction 02/12/2018  . Left anterior cruciate ligament tear 01/24/2018  . GERD (gastroesophageal reflux disease) 06/14/2015  . Asthma 04/29/2015  . Obesity 04/29/2015  . Vasomotor rhinitis 09/01/2014  . Insomnia 11/22/2012  . Anxiety 11/22/2012  . Rotator cuff dysfunction 06/18/2012  . Osteoarthritis of left shoulder 06/18/2012  . Shoulder pain, left 06/18/2012    Marvell Fuller, PTA 02/15/2018, 11:15 AM  [facility] [address] [place], [place], [address] Phone: [phone]   Fax:  [phone]  Name: KIPTYN RAFUSE MRN: [id number] Date of Birth: 1980-10-04

## 2018-02-18 ENCOUNTER — Ambulatory Visit: Payer: BLUE CROSS/BLUE SHIELD | Admitting: Physical Therapy

## 2018-02-18 ENCOUNTER — Encounter: Payer: Self-pay | Admitting: Physical Therapy

## 2018-02-18 DIAGNOSIS — M25562 Pain in left knee: Secondary | ICD-10-CM

## 2018-02-18 DIAGNOSIS — M6281 Muscle weakness (generalized): Secondary | ICD-10-CM | POA: Diagnosis not present

## 2018-02-18 DIAGNOSIS — R600 Localized edema: Secondary | ICD-10-CM | POA: Diagnosis not present

## 2018-02-18 DIAGNOSIS — M25662 Stiffness of left knee, not elsewhere classified: Secondary | ICD-10-CM

## 2018-02-18 NOTE — Therapy (Signed)
[facility] [address] [place], [place], [address] Phone: [phone]   Fax:  [phone]  Physical Therapy Treatment  Patient Details  Name: Chase Stout MRN: [id number] Date of Birth: [birth year] Referring Provider: Annell Greening MD   Encounter Date: 02/18/2018  PT End of Session - 02/18/18 1118    Visit Number  3    Number of Visits  18    Date for PT Re-Evaluation  04/18/18    Authorization Type  FOTO AT LEAST EVERY 5TH VISIT, 10TH VISIT PROGRESS NOTE.    PT Start Time  1116    PT Stop Time  1205    PT Time Calculation (min)  49 min    Activity Tolerance  Patient tolerated treatment well    Behavior During Therapy  WFL for tasks assessed/performed       Past Medical History:  Diagnosis Date  . Allergy   . Hydrocele 01/31/1999   small hydroceles bilaterally, small calcified loose body in left hemiscrotum  . Kidney stones   . Osteoarthritis of left shoulder 06/18/2012  . Sinus infection     Past Surgical History:  Procedure Laterality Date  . EYE SURGERY Bilateral    Lasix  . KNEE ARTHROSCOPY    . LITHOTRIPSY    . NASAL SINUS SURGERY    . SHOULDER HEMI-ARTHROPLASTY  09/24/2012   Procedure: SHOULDER HEMI-ARTHROPLASTY;  Surgeon: Eulas Post, MD;  Location: [facility] OR;  Service: Orthopedics;  Laterality: Left;    There were no vitals filed for this visit.  Subjective Assessment - 02/18/18 1117    Subjective  Reports that today he has noticed popping around his kneecap.    Patient Stated Goals  Get back to full duty work.    Currently in Pain?  No/denies         [facility] PT Assessment - 02/18/18 0001      Assessment   Medical Diagnosis  Rupture of ACL, left knee.    Onset Date/Surgical Date  02/04/18    Next MD Visit  03/14/2018      ROM / Strength   AROM / PROM / Strength  AROM      AROM   Overall AROM   Within functional limits for tasks performed    AROM Assessment Site  Knee    Right/Left Knee  Left    Left Knee Extension  0 following very gentle PROM into extension                   [facility] Adult PT Treatment/Exercise - 02/18/18 0001      Knee/Hip Exercises: Aerobic   Nustep  L1, seat 10-8 x16 min      Knee/Hip Exercises: Supine   Quad Sets  Left;Limitations    Quad Sets Limitations  VMS faciliated QS to faciliate neuro re-ed of L quad for volitional contraction      Modalities   Modalities  Electrical Stimulation;Vasopneumatic      Electrical Stimulation   Electrical Stimulation Location  L VMO/Quad; L knee    Electrical Stimulation Action  VMS with QS; Pre-Mod    Electrical Stimulation Parameters  10/10, 50 pps, 5 sec ramp, 300 usec x10 min    Electrical Stimulation Goals  Neuromuscular facilitation;Edema;Pain      Vasopneumatic   Number Minutes Vasopneumatic   10 minutes    Vasopnuematic Location   Knee    Vasopneumatic Pressure  Low    Vasopneumatic Temperature   34  Manual Therapy   Manual Therapy  Passive ROM;Soft tissue mobilization    Soft tissue mobilization  L patella mobilizations and incision mobilizations to reduce adhesion development and ensure proper mobility    Passive ROM  Very gentle PROM of L knee into extension to ensure full L knee extension                  PT Long Term Goals - 02/14/18 1540      PT LONG TERM GOAL #1   Title  Independent with a HEP.    Time  9    Period  Weeks    Status  New      PT LONG TERM GOAL #2   Title  Full active left knee extension in order to normalize gait.    Time  9    Period  Weeks    Status  New      PT LONG TERM GOAL #3   Title  Active knee flexion to 115 degrees+ so the patient can perform functional tasks and do so with pain not > 2-3/10.    Time  9    Period  Weeks    Status  New      PT LONG TERM GOAL #4   Title  Increase right hip and  knee strength to a solid 4+/5 to provide good stability for accomplishment of functional activities.    Time  9    Period  Weeks    Status   New      PT LONG TERM GOAL #5   Title  Perform a reciprocating stair gait with one railing with pain not > 2-3/10.    Time  9    Period  Weeks    Status  New            Plan - 02/18/18 1148    Clinical Impression Statement  Patient arrived to clinic today with L knee immobilizer donned but no AD. Patient experiencing L knee popping surrounding L patella that he did not experience yesterday. No pain reported with popping per patient report. No complaints with Nustep session with seat advances. Steristrips still donned to knee incision as well as incision just inferior to patella but patient to remove tonight. Very gentle PROM of L knee completed to ensure full knee extension as patient reported increased pain especially at end range. Good L patella and incision mobility observed in today's treatment. Good L quad contraction accomplished with VMS and QS. During QS VMS patient able to accomplish knee extension that matched RLE. Normal modalities response noted following removal of the modalities.    Rehab Potential  Excellent    PT Treatment/Interventions  ADLs/Self Care Home Management;Cryotherapy;Electrical Stimulation;Functional mobility training;Therapeutic activities;Therapeutic exercise;Neuromuscular re-education;Patient/family education;Passive range of motion;Manual techniques;Vasopneumatic Device    PT Next Visit Plan  VMS to left quadriceps; assure patient has full left knee extension.  Vasopneumatic.  Follow ACL protocol.    Consulted and Agree with Plan of Care  Patient       Patient will benefit from skilled therapeutic intervention in order to improve the following deficits and impairments:  Abnormal gait, Decreased activity tolerance, Decreased range of motion, Decreased strength, Increased edema, Pain  Visit Diagnosis: Acute pain of left knee  Localized edema  Muscle weakness (generalized)  Stiffness of left knee, not elsewhere classified     Problem List Patient  Active Problem List   Diagnosis Date Noted  . S/P ACL reconstruction 02/12/2018  .  Left anterior cruciate ligament tear 01/24/2018  . GERD (gastroesophageal reflux disease) 06/14/2015  . Asthma 04/29/2015  . Obesity 04/29/2015  . Vasomotor rhinitis 09/01/2014  . Insomnia 11/22/2012  . Anxiety 11/22/2012  . Rotator cuff dysfunction 06/18/2012  . Osteoarthritis of left shoulder 06/18/2012  . Shoulder pain, left 06/18/2012    Marvell FullerKelsey P Oleg Oleson, PTA 02/18/2018, 12:06 PM  [facility] [address] [place], [place], [address] Phone: [phone]   Fax:  [phone]  Name: Chase Stout MRN: [id number] Date of Birth: 01/15/1981

## 2018-02-20 ENCOUNTER — Ambulatory Visit: Payer: BLUE CROSS/BLUE SHIELD | Admitting: Physical Therapy

## 2018-02-20 ENCOUNTER — Encounter: Payer: Self-pay | Admitting: Physical Therapy

## 2018-02-20 DIAGNOSIS — M6281 Muscle weakness (generalized): Secondary | ICD-10-CM

## 2018-02-20 DIAGNOSIS — M25662 Stiffness of left knee, not elsewhere classified: Secondary | ICD-10-CM

## 2018-02-20 DIAGNOSIS — R600 Localized edema: Secondary | ICD-10-CM

## 2018-02-20 DIAGNOSIS — M25562 Pain in left knee: Secondary | ICD-10-CM | POA: Diagnosis not present

## 2018-02-20 NOTE — Therapy (Signed)
[facility] [address] [place], [place], [address] Phone: [phone]   Fax:  [phone]  Physical Therapy Treatment  Patient Details  Name: Chase Stout MRN: [id number] Date of Birth: [birth year] Referring Provider: Annell Greening MD   Encounter Date: 02/20/2018  PT End of Session - 02/20/18 1230    Visit Number  4    Number of Visits  18    Date for PT Re-Evaluation  04/18/18    Authorization Type  FOTO AT LEAST EVERY 5TH VISIT, 10TH VISIT PROGRESS NOTE.    PT Start Time  1115    PT Stop Time  1212    PT Time Calculation (min)  57 min    Activity Tolerance  Patient tolerated treatment well    Behavior During Therapy  WFL for tasks assessed/performed       Past Medical History:  Diagnosis Date  . Allergy   . Hydrocele 01/31/1999   small hydroceles bilaterally, small calcified loose body in left hemiscrotum  . Kidney stones   . Osteoarthritis of left shoulder 06/18/2012  . Sinus infection     Past Surgical History:  Procedure Laterality Date  . EYE SURGERY Bilateral    Lasix  . KNEE ARTHROSCOPY    . LITHOTRIPSY    . NASAL SINUS SURGERY    . SHOULDER HEMI-ARTHROPLASTY  09/24/2012   Procedure: SHOULDER HEMI-ARTHROPLASTY;  Surgeon: Eulas Post, MD;  Location: [facility] OR;  Service: Orthopedics;  Laterality: Left;    There were no vitals filed for this visit.  Subjective Assessment - 02/20/18 1230    Patient Stated Goals  Get back to full duty work.    Currently in Pain?  Yes    Pain Score  4     Pain Location  Knee    Pain Orientation  Left    Pain Onset  1 to 4 weeks ago                       [facility] Adult PT Treatment/Exercise - 02/20/18 0001      Exercises   Exercises  Knee/Hip      Knee/Hip Exercises: Aerobic   Nustep  Level 2 moving forward x 2 to increase left knee flexion x 15 minutes.      Knee/Hip Exercises: Supine   Quad Sets Limitations  QS facilitated with left VMS for neuro  re-education with 10 sec holds and 10 sec rest x 15 minutes.      Modalities   Modalities  Estate agent Stimulation Location  Left knee.    Electrical Stimulation Action  Pre-mod.    Electrical Stimulation Parameters  1-10 Hz x 20 minutes.    Electrical Stimulation Goals  Edema;Pain                  PT Long Term Goals - 02/14/18 1540      PT LONG TERM GOAL #1   Title  Independent with a HEP.    Time  9    Period  Weeks    Status  New      PT LONG TERM GOAL #2   Title  Full active left knee extension in order to normalize gait.    Time  9    Period  Weeks    Status  New      PT LONG TERM GOAL #3   Title  Active knee flexion  to 115 degrees+ so the patient can perform functional tasks and do so with pain not > 2-3/10.    Time  9    Period  Weeks    Status  New      PT LONG TERM GOAL #4   Title  Increase right hip and  knee strength to a solid 4+/5 to provide good stability for accomplishment of functional activities.    Time  9    Period  Weeks    Status  New      PT LONG TERM GOAL #5   Title  Perform a reciprocating stair gait with one railing with pain not > 2-3/10.    Time  9    Period  Weeks    Status  New            Plan - 02/20/18 1227    Clinical Impression Statement  Patient walked into clinic with left knee immobilizer on but without crutches and wbat over his left LE.  He demonstrated right knee flexion to 100 degrees today and performed a SLR without an extensor lag.    PT Treatment/Interventions  ADLs/Self Care Home Management;Cryotherapy;Electrical Stimulation;Functional mobility training;Therapeutic activities;Therapeutic exercise;Neuromuscular re-education;Patient/family education;Passive range of motion;Manual techniques;Vasopneumatic Device    PT Next Visit Plan  VMS to left quadriceps; assure patient has full left knee extension.  Vasopneumatic.  Follow ACL protocol.     Consulted and Agree with Plan of Care  Patient       Patient will benefit from skilled therapeutic intervention in order to improve the following deficits and impairments:  Abnormal gait, Decreased activity tolerance, Decreased range of motion, Decreased strength, Increased edema, Pain  Visit Diagnosis: Acute pain of left knee  Localized edema  Muscle weakness (generalized)  Stiffness of left knee, not elsewhere classified     Problem List Patient Active Problem List   Diagnosis Date Noted  . S/P ACL reconstruction 02/12/2018  . Left anterior cruciate ligament tear 01/24/2018  . GERD (gastroesophageal reflux disease) 06/14/2015  . Asthma 04/29/2015  . Obesity 04/29/2015  . Vasomotor rhinitis 09/01/2014  . Insomnia 11/22/2012  . Anxiety 11/22/2012  . Rotator cuff dysfunction 06/18/2012  . Osteoarthritis of left shoulder 06/18/2012  . Shoulder pain, left 06/18/2012    Ferdie Bakken, [place] MPT 02/20/2018, 12:48 PM  [facility] [address] [place], [place], [address] Phone: [phone]   Fax:  [phone]  Name: Chase Stout MRN: [id number] Date of Birth: 11/22/1980

## 2018-02-22 ENCOUNTER — Ambulatory Visit: Payer: BLUE CROSS/BLUE SHIELD | Admitting: *Deleted

## 2018-02-22 DIAGNOSIS — M25562 Pain in left knee: Secondary | ICD-10-CM | POA: Diagnosis not present

## 2018-02-22 DIAGNOSIS — R600 Localized edema: Secondary | ICD-10-CM | POA: Diagnosis not present

## 2018-02-22 DIAGNOSIS — M6281 Muscle weakness (generalized): Secondary | ICD-10-CM

## 2018-02-22 DIAGNOSIS — M25662 Stiffness of left knee, not elsewhere classified: Secondary | ICD-10-CM

## 2018-02-22 NOTE — Therapy (Signed)
[facility] [address] [place], [place], [address] Phone: [phone]   Fax:  [phone]  Physical Therapy Treatment  Patient Details  Name: Chase Stout MRN: [id number] Date of Birth: [birth year] Referring Provider: Annell Greening MD   Encounter Date: 02/22/2018  PT End of Session - 02/22/18 1217    Visit Number  5    Number of Visits  18    Date for PT Re-Evaluation  04/18/18    Authorization Type  FOTO AT LEAST EVERY 5TH VISIT, 10TH VISIT PROGRESS NOTE.    PT Start Time  1115    PT Stop Time  1215    PT Time Calculation (min)  60 min       Past Medical History:  Diagnosis Date  . Allergy   . Hydrocele 01/31/1999   small hydroceles bilaterally, small calcified loose body in left hemiscrotum  . Kidney stones   . Osteoarthritis of left shoulder 06/18/2012  . Sinus infection     Past Surgical History:  Procedure Laterality Date  . EYE SURGERY Bilateral    Lasix  . KNEE ARTHROSCOPY    . LITHOTRIPSY    . NASAL SINUS SURGERY    . SHOULDER HEMI-ARTHROPLASTY  09/24/2012   Procedure: SHOULDER HEMI-ARTHROPLASTY;  Surgeon: Eulas Post, MD;  Location: [facility] OR;  Service: Orthopedics;  Laterality: Left;    There were no vitals filed for this visit.  Subjective Assessment - 02/22/18 1216    Subjective  I'm doing good. Minimal pain LT knee    Patient Stated Goals  Get back to full duty work.    Currently in Pain?  Yes    Pain Score  3     Pain Location  Knee    Pain Orientation  Left    Pain Descriptors / Indicators  Throbbing    Pain Type  Surgical pain    Pain Onset  1 to 4 weeks ago    Pain Frequency  Constant                       [facility] Adult PT Treatment/Exercise - 02/22/18 0001      Exercises   Exercises  Knee/Hip      Knee/Hip Exercises: Aerobic   Nustep  Level 3 moving forward x 2 to increase left knee flexion x 15 minutes.      Knee/Hip Exercises: Supine   Quad Sets  -- with VMS       Modalities   Modalities  Electrical Stimulation;Vasopneumatic      Electrical Stimulation   Electrical Stimulation Location  Left knee. VMS x 15 mins 10 secs on/off with QS and last 2 mins SLR    Electrical Stimulation Action  Premod x 15 min with  Vaso    Electrical Stimulation Goals  Edema;Pain      Vasopneumatic   Number Minutes Vasopneumatic   15 minutes    Vasopnuematic Location   Knee    Vasopneumatic Pressure  Low    Vasopneumatic Temperature   34                  PT Long Term Goals - 02/14/18 1540      PT LONG TERM GOAL #1   Title  Independent with a HEP.    Time  9    Period  Weeks    Status  New      PT LONG TERM GOAL #2   Title  Full  active left knee extension in order to normalize gait.    Time  9    Period  Weeks    Status  New      PT LONG TERM GOAL #3   Title  Active knee flexion to 115 degrees+ so the patient can perform functional tasks and do so with pain not > 2-3/10.    Time  9    Period  Weeks    Status  New      PT LONG TERM GOAL #4   Title  Increase right hip and  knee strength to a solid 4+/5 to provide good stability for accomplishment of functional activities.    Time  9    Period  Weeks    Status  New      PT LONG TERM GOAL #5   Title  Perform a reciprocating stair gait with one railing with pain not > 2-3/10.    Time  9    Period  Weeks    Status  New            Plan - 02/22/18 1225    Clinical Impression Statement  Pt arrived doing fairly well with low pain levels LT knee. He was able to complete theres today with mainly soreness. SLR no lag and ROM progression 0-110 degrees.    Clinical Presentation  Stable    Rehab Potential  Excellent    PT Treatment/Interventions  ADLs/Self Care Home Management;Cryotherapy;Electrical Stimulation;Functional mobility training;Therapeutic activities;Therapeutic exercise;Neuromuscular re-education;Patient/family education;Passive range of motion;Manual techniques;Vasopneumatic Device     PT Next Visit Plan  VMS to left quadriceps; assure patient has full left knee extension.  Vasopneumatic.  Follow ACL protocol.    Consulted and Agree with Plan of Care  Patient       Patient will benefit from skilled therapeutic intervention in order to improve the following deficits and impairments:  Abnormal gait, Decreased activity tolerance, Decreased range of motion, Decreased strength, Increased edema, Pain  Visit Diagnosis: Localized edema  Muscle weakness (generalized)  Stiffness of left knee, not elsewhere classified  Acute pain of left knee     Problem List Patient Active Problem List   Diagnosis Date Noted  . S/P ACL reconstruction 02/12/2018  . Left anterior cruciate ligament tear 01/24/2018  . GERD (gastroesophageal reflux disease) 06/14/2015  . Asthma 04/29/2015  . Obesity 04/29/2015  . Vasomotor rhinitis 09/01/2014  . Insomnia 11/22/2012  . Anxiety 11/22/2012  . Rotator cuff dysfunction 06/18/2012  . Osteoarthritis of left shoulder 06/18/2012  . Shoulder pain, left 06/18/2012    Callum Wolf,CHRIS, PTA 02/22/2018, 12:33 PM  [facility] [address] [place], [place], [address] Phone: [phone]   Fax:  [phone]  Name: Chase Stout MRN: [id number] Date of Birth: 06/09/1981

## 2018-02-25 ENCOUNTER — Ambulatory Visit: Payer: BLUE CROSS/BLUE SHIELD | Admitting: *Deleted

## 2018-02-25 DIAGNOSIS — M25562 Pain in left knee: Secondary | ICD-10-CM

## 2018-02-25 DIAGNOSIS — R600 Localized edema: Secondary | ICD-10-CM | POA: Diagnosis not present

## 2018-02-25 DIAGNOSIS — M25662 Stiffness of left knee, not elsewhere classified: Secondary | ICD-10-CM

## 2018-02-25 DIAGNOSIS — M6281 Muscle weakness (generalized): Secondary | ICD-10-CM

## 2018-02-25 NOTE — Therapy (Signed)
[facility] [address] [place], [place], [address] Phone: [phone]   Fax:  [phone]  Physical Therapy Treatment  Patient Details  Name: Chase Stout MRN: [id number] Date of Birth: 1980/10/25 Referring Provider: Annell Greening MD   Encounter Date: 02/25/2018  PT End of Session - 02/25/18 1133    Visit Number  6    Number of Visits  18    Date for PT Re-Evaluation  04/18/18    Authorization Type  FOTO AT LEAST EVERY 5TH VISIT, 10TH VISIT PROGRESS NOTE.    PT Start Time  1115    PT Stop Time  1210    PT Time Calculation (min)  55 min       Past Medical History:  Diagnosis Date  . Allergy   . Hydrocele 01/31/1999   small hydroceles bilaterally, small calcified loose body in left hemiscrotum  . Kidney stones   . Osteoarthritis of left shoulder 06/18/2012  . Sinus infection     Past Surgical History:  Procedure Laterality Date  . EYE SURGERY Bilateral    Lasix  . KNEE ARTHROSCOPY    . LITHOTRIPSY    . NASAL SINUS SURGERY    . SHOULDER HEMI-ARTHROPLASTY  09/24/2012   Procedure: SHOULDER HEMI-ARTHROPLASTY;  Surgeon: Eulas Post, MD;  Location: [facility] OR;  Service: Orthopedics;  Laterality: Left;    There were no vitals filed for this visit.  Subjective Assessment - 02/25/18 1119    Subjective  I'm doing good. Minimal pain LT knee.  3 weeks today    Patient Stated Goals  Get back to full duty work.    Currently in Pain?  Yes    Pain Score  3     Pain Location  Knee    Pain Orientation  Left    Pain Descriptors / Indicators  Sore    Pain Onset  1 to 4 weeks ago    Pain Frequency  Constant                       [facility] Adult PT Treatment/Exercise - 02/25/18 0001      Exercises   Exercises  Knee/Hip      Knee/Hip Exercises: Aerobic   Recumbent Bike  L1 x 10 mins    Nustep  Level 6 moving forward x 2 to increase left knee flexion x 10 minutes.      Knee/Hip Exercises: Supine   Quad Sets  -- x 5  mins with VMS    Straight Leg Raises  -- x 5 mins with VMS      Modalities   Modalities  Electrical Stimulation;Vasopneumatic      Electrical Stimulation   Electrical Stimulation Location  Left knee. VMS x 10 mins 10 secs on/off with QS and last 5 mins SLR    Electrical Stimulation Action  premod x9min 80-150hz     Electrical Stimulation Goals  Edema;Pain      Vasopneumatic   Number Minutes Vasopneumatic   15 minutes    Vasopnuematic Location   Knee    Vasopneumatic Pressure  Low    Vasopneumatic Temperature   34                  PT Long Term Goals - 02/14/18 1540      PT LONG TERM GOAL #1   Title  Independent with a HEP.    Time  9    Period  Weeks  Status  New      PT LONG TERM GOAL #2   Title  Full active left knee extension in order to normalize gait.    Time  9    Period  Weeks    Status  New      PT LONG TERM GOAL #3   Title  Active knee flexion to 115 degrees+ so the patient can perform functional tasks and do so with pain not > 2-3/10.    Time  9    Period  Weeks    Status  New      PT LONG TERM GOAL #4   Title  Increase right hip and  knee strength to a solid 4+/5 to provide good stability for accomplishment of functional activities.    Time  9    Period  Weeks    Status  New      PT LONG TERM GOAL #5   Title  Perform a reciprocating stair gait with one railing with pain not > 2-3/10.    Time  9    Period  Weeks    Status  New            Plan - 02/25/18 1157    Clinical Impression Statement  Pt  returns today doing fairly well with LT knee. ROM 0-110 degrees and good quad activation now with no lag. Try CKC TKEs and 4in step-ups next visit.  Normal modality response today.    Clinical Presentation  Stable    Rehab Potential  Excellent    PT Treatment/Interventions  ADLs/Self Care Home Management;Cryotherapy;Electrical Stimulation;Functional mobility training;Therapeutic activities;Therapeutic exercise;Neuromuscular  re-education;Patient/family education;Passive range of motion;Manual techniques;Vasopneumatic Device    PT Next Visit Plan  VMS to left quadriceps; assure patient has full left knee extension.  Vasopneumatic.  Follow ACL protocol.  Start 2-4 in step-ups and TKEs, balance next visit  DC VMS?       Patient will benefit from skilled therapeutic intervention in order to improve the following deficits and impairments:  Abnormal gait, Decreased activity tolerance, Decreased range of motion, Decreased strength, Increased edema, Pain  Visit Diagnosis: Localized edema  Muscle weakness (generalized)  Stiffness of left knee, not elsewhere classified  Acute pain of left knee     Problem List Patient Active Problem List   Diagnosis Date Noted  . S/P ACL reconstruction 02/12/2018  . Left anterior cruciate ligament tear 01/24/2018  . GERD (gastroesophageal reflux disease) 06/14/2015  . Asthma 04/29/2015  . Obesity 04/29/2015  . Vasomotor rhinitis 09/01/2014  . Insomnia 11/22/2012  . Anxiety 11/22/2012  . Rotator cuff dysfunction 06/18/2012  . Osteoarthritis of left shoulder 06/18/2012  . Shoulder pain, left 06/18/2012    Jermisha Hoffart,CHRIS , PTA 02/25/2018, 12:28 PM  [facility] [address] [place], [place], [address] Phone: [phone]   Fax:  [phone]  Name: Chase Stout MRN: [id number] Date of Birth: 10/11/1980

## 2018-02-27 ENCOUNTER — Ambulatory Visit: Payer: BLUE CROSS/BLUE SHIELD | Admitting: Physical Therapy

## 2018-02-27 DIAGNOSIS — M25662 Stiffness of left knee, not elsewhere classified: Secondary | ICD-10-CM

## 2018-02-27 DIAGNOSIS — M25562 Pain in left knee: Secondary | ICD-10-CM

## 2018-02-27 DIAGNOSIS — M6281 Muscle weakness (generalized): Secondary | ICD-10-CM

## 2018-02-27 DIAGNOSIS — R600 Localized edema: Secondary | ICD-10-CM | POA: Diagnosis not present

## 2018-02-27 NOTE — Therapy (Signed)
[facility] [address] [place], [place], [address] Phone: [phone]   Fax:  [phone]  Physical Therapy Treatment  Patient Details  Name: Chase Stout MRN: [id number] Date of Birth: 1980/12/10 Referring Provider: Annell Greening MD   Encounter Date: 02/27/2018  PT End of Session - 02/27/18 1214    Visit Number  7    Number of Visits  18    Date for PT Re-Evaluation  04/18/18    Authorization Type  FOTO AT LEAST EVERY 5TH VISIT, 10TH VISIT PROGRESS NOTE.    PT Start Time  1115    PT Stop Time  1220    PT Time Calculation (min)  65 min       Past Medical History:  Diagnosis Date  . Allergy   . Hydrocele 01/31/1999   small hydroceles bilaterally, small calcified loose body in left hemiscrotum  . Kidney stones   . Osteoarthritis of left shoulder 06/18/2012  . Sinus infection     Past Surgical History:  Procedure Laterality Date  . EYE SURGERY Bilateral    Lasix  . KNEE ARTHROSCOPY    . LITHOTRIPSY    . NASAL SINUS SURGERY    . SHOULDER HEMI-ARTHROPLASTY  09/24/2012   Procedure: SHOULDER HEMI-ARTHROPLASTY;  Surgeon: Eulas Post, MD;  Location: [facility] OR;  Service: Orthopedics;  Laterality: Left;    There were no vitals filed for this visit.  Subjective Assessment - 02/27/18 1215    Subjective  I feeling good.    Patient Stated Goals  Get back to full duty work.    Currently in Pain?  Yes    Pain Score  3     Pain Location  Knee    Pain Orientation  Left    Pain Descriptors / Indicators  Sore    Pain Type  Surgical pain    Pain Onset  1 to 4 weeks ago                       [facility] Adult PT Treatment/Exercise - 02/27/18 0001      Exercises   Exercises  Knee/Hip      Knee/Hip Exercises: Aerobic   Stationary Bike  10 minutes.    Nustep  Level 6 x 10 minutes.      Knee/Hip Exercises: Supine   Quad Sets Limitations  Quad sets x 15 minutes facilitated with 4 electrode VMS with 10 sec contraction  holds and 10 sec rest.      Modalities   Modalities  Electrical Stimulation;Vasopneumatic      Electrical Stimulation   Electrical Stimulation Location  Left knee.    Electrical Stimulation Action  IFC at 1-10 Hz x 20 minutes.    Electrical Stimulation Goals  Edema;Pain      Vasopneumatic   Number Minutes Vasopneumatic   20 minutes    Vasopnuematic Location   -- Left knee.    Vasopneumatic Pressure  Medium      Manual Therapy   Manual Therapy  Passive ROM    Passive ROM  Gentle end-range let knee flexion stretch in supine sustained for 3 minutes.                  PT Long Term Goals - 02/14/18 1540      PT LONG TERM GOAL #1   Title  Independent with a HEP.    Time  9    Period  Weeks    Status  New      PT LONG TERM GOAL #2   Title  Full active left knee extension in order to normalize gait.    Time  9    Period  Weeks    Status  New      PT LONG TERM GOAL #3   Title  Active knee flexion to 115 degrees+ so the patient can perform functional tasks and do so with pain not > 2-3/10.    Time  9    Period  Weeks    Status  New      PT LONG TERM GOAL #4   Title  Increase right hip and  knee strength to a solid 4+/5 to provide good stability for accomplishment of functional activities.    Time  9    Period  Weeks    Status  New      PT LONG TERM GOAL #5   Title  Perform a reciprocating stair gait with one railing with pain not > 2-3/10.    Time  9    Period  Weeks    Status  New            Plan - 02/27/18 1243    Clinical Impression Statement  The patient is making excellent progress with nearly full left knee range of motion now.    PT Treatment/Interventions  ADLs/Self Care Home Management;Cryotherapy;Electrical Stimulation;Functional mobility training;Therapeutic activities;Therapeutic exercise;Neuromuscular re-education;Patient/family education;Passive range of motion;Manual techniques;Vasopneumatic Device    PT Next Visit Plan  VMS to left  quadriceps; assure patient has full left knee extension.  Vasopneumatic.  Follow ACL protocol.  Start 2-4 in step-ups and TKEs, balance next visit  DC VMS?    Consulted and Agree with Plan of Care  Patient       Patient will benefit from skilled therapeutic intervention in order to improve the following deficits and impairments:     Visit Diagnosis: Localized edema  Muscle weakness (generalized)  Stiffness of left knee, not elsewhere classified  Acute pain of left knee     Problem List Patient Active Problem List   Diagnosis Date Noted  . S/P ACL reconstruction 02/12/2018  . Left anterior cruciate ligament tear 01/24/2018  . GERD (gastroesophageal reflux disease) 06/14/2015  . Asthma 04/29/2015  . Obesity 04/29/2015  . Vasomotor rhinitis 09/01/2014  . Insomnia 11/22/2012  . Anxiety 11/22/2012  . Rotator cuff dysfunction 06/18/2012  . Osteoarthritis of left shoulder 06/18/2012  . Shoulder pain, left 06/18/2012    Chase Stout, [place] MPT 02/27/2018, 12:46 PM  [facility] [address] [place], [place], [address] Phone: [phone]   Fax:  [phone]  Name: Chase Stout MRN: [id number] Date of Birth: 09/07/1980

## 2018-03-01 ENCOUNTER — Ambulatory Visit: Payer: BLUE CROSS/BLUE SHIELD | Admitting: Physical Therapy

## 2018-03-04 ENCOUNTER — Encounter: Payer: Self-pay | Admitting: Physical Therapy

## 2018-03-04 ENCOUNTER — Ambulatory Visit: Payer: BLUE CROSS/BLUE SHIELD | Attending: Orthopaedic Surgery | Admitting: Physical Therapy

## 2018-03-04 DIAGNOSIS — R600 Localized edema: Secondary | ICD-10-CM | POA: Insufficient documentation

## 2018-03-04 DIAGNOSIS — M25562 Pain in left knee: Secondary | ICD-10-CM | POA: Diagnosis not present

## 2018-03-04 DIAGNOSIS — M25662 Stiffness of left knee, not elsewhere classified: Secondary | ICD-10-CM | POA: Diagnosis not present

## 2018-03-04 DIAGNOSIS — M6281 Muscle weakness (generalized): Secondary | ICD-10-CM | POA: Diagnosis not present

## 2018-03-04 NOTE — Therapy (Signed)
[facility] [address] [place], [place], [address] Phone: [phone]   Fax:  [phone]  Physical Therapy Treatment  Patient Details  Name: Chase Stout MRN: [id number] Date of Birth: November 26, 1980 Referring Provider: Annell Greening MD   Encounter Date: 03/04/2018  PT End of Session - 03/04/18 1319    Visit Number  8    Number of Visits  18    Date for PT Re-Evaluation  04/18/18    Authorization Type  FOTO AT LEAST EVERY 5TH VISIT, 10TH VISIT PROGRESS NOTE.    PT Start Time  1301    PT Stop Time  1345    PT Time Calculation (min)  44 min       Past Medical History:  Diagnosis Date  . Allergy   . Hydrocele 01/31/1999   small hydroceles bilaterally, small calcified loose body in left hemiscrotum  . Kidney stones   . Osteoarthritis of left shoulder 06/18/2012  . Sinus infection     Past Surgical History:  Procedure Laterality Date  . EYE SURGERY Bilateral    Lasix  . KNEE ARTHROSCOPY    . LITHOTRIPSY    . NASAL SINUS SURGERY    . SHOULDER HEMI-ARTHROPLASTY  09/24/2012   Procedure: SHOULDER HEMI-ARTHROPLASTY;  Surgeon: Eulas Post, MD;  Location: [facility] OR;  Service: Orthopedics;  Laterality: Left;    There were no vitals filed for this visit.  Subjective Assessment - 03/04/18 1317    Subjective  Feeling good upon arrival. Did the stationary bike at the gym. Only has small number of stairs at home.    Patient Stated Goals  Get back to full duty work.    Currently in Pain?  No/denies         [facility] PT Assessment - 03/04/18 0001      Assessment   Medical Diagnosis  Rupture of ACL, left knee.    Onset Date/Surgical Date  02/04/18    Next MD Visit  03/14/2018      ROM / Strength   AROM / PROM / Strength  AROM      AROM   Overall AROM   Within functional limits for tasks performed    AROM Assessment Site  Knee    Right/Left Knee  Left    Left Knee Flexion  130                   [facility] Adult PT  Treatment/Exercise - 03/04/18 0001      Knee/Hip Exercises: Aerobic   Stationary Bike  L3 x15 min      Knee/Hip Exercises: Standing   Heel Raises  Both;2 sets;10 reps;Other (comment) B toe raise x20 reps    Terminal Knee Extension  Strengthening;Left;2 sets;10 reps;Theraband    Theraband Level (Terminal Knee Extension)  Level 2 (Red)    Forward Step Up  Left;3 sets;10 reps;Hand Hold: 2;Step Height: 4"    SLS  LLE SLS with R toe touch and no toe touch 3x30 sec     Other Standing Knee Exercises  DLS on BOSU x2 min      Knee/Hip Exercises: Supine   Bridges  Strengthening;2 sets;10 reps    Straight Leg Raises  Strengthening;Left;3 sets;10 reps    Straight Leg Raise with External Rotation  Strengthening;Left;2 sets;10 reps    Other Supine Knee/Hip Exercises  B clam green theraband x20 reps      Knee/Hip Exercises: Sidelying   Hip ABduction  AROM;Left;2 sets;10 reps  PT Education - 03/04/18 1347    Education Details  HEP- SLR, SLR with ER, hip abduction, bridging, heel raises    Person(s) Educated  Patient    Methods  Explanation;Verbal cues;Handout    Comprehension  Verbalized understanding          PT Long Term Goals - 03/04/18 1340      PT LONG TERM GOAL #1   Title  Independent with a HEP.    Time  9    Period  Weeks    Status  On-going      PT LONG TERM GOAL #2   Title  Full active left knee extension in order to normalize gait.    Time  9    Period  Weeks    Status  Achieved      PT LONG TERM GOAL #3   Title  Active knee flexion to 115 degrees+ so the patient can perform functional tasks and do so with pain not > 2-3/10.    Time  9    Period  Weeks    Status  Achieved      PT LONG TERM GOAL #4   Title  Increase right hip and  knee strength to a solid 4+/5 to provide good stability for accomplishment of functional activities.    Time  9    Period  Weeks    Status  On-going      PT LONG TERM GOAL #5   Title  Perform a reciprocating stair  gait with one railing with pain not > 2-3/10.    Time  9    Period  Weeks    Status  On-going            Plan - 03/04/18 1418    Clinical Impression Statement  Patient tolerated today's treatment well with good L quad activation and L knee ROM. Full L knee ROM measured as L knee flexion was 130 deg as 0 deg extension has been achieved previously. L patella discomfort minimal with forward step ups at 4" step. Patient progressed with exercises per protocol without complaint of pain during the exercises. Decreased muscle mass/tone in L gastrocnemius upon observation. No modalities conducted per no pain reported by patient. Patient provided HEP for hip/knee strengthening with patient educated with parameters and technique.    Rehab Potential  Excellent    PT Treatment/Interventions  ADLs/Self Care Home Management;Cryotherapy;Electrical Stimulation;Functional mobility training;Therapeutic activities;Therapeutic exercise;Neuromuscular re-education;Patient/family education;Passive range of motion;Manual techniques;Vasopneumatic Device    PT Next Visit Plan  Continue L hip/knee strengthening per MPT POC and protocol.    Consulted and Agree with Plan of Care  Patient       Patient will benefit from skilled therapeutic intervention in order to improve the following deficits and impairments:  Abnormal gait, Decreased activity tolerance, Decreased range of motion, Decreased strength, Increased edema, Pain  Visit Diagnosis: Localized edema  Muscle weakness (generalized)  Stiffness of left knee, not elsewhere classified  Acute pain of left knee     Problem List Patient Active Problem List   Diagnosis Date Noted  . S/P ACL reconstruction 02/12/2018  . Left anterior cruciate ligament tear 01/24/2018  . GERD (gastroesophageal reflux disease) 06/14/2015  . Asthma 04/29/2015  . Obesity 04/29/2015  . Vasomotor rhinitis 09/01/2014  . Insomnia 11/22/2012  . Anxiety 11/22/2012  . Rotator cuff  dysfunction 06/18/2012  . Osteoarthritis of left shoulder 06/18/2012  . Shoulder pain, left 06/18/2012    Chase Stout, PTA 03/04/2018,  2:23 PM  [facility] [address] [place], [place], [address] Phone: [phone]   Fax:  [phone]  Name: Chase Stout MRN: [id number] Date of Birth: 03/18/1981

## 2018-03-05 ENCOUNTER — Encounter: Payer: Self-pay | Admitting: *Deleted

## 2018-03-08 ENCOUNTER — Other Ambulatory Visit: Payer: Self-pay | Admitting: Family

## 2018-03-08 MED ORDER — ZOLPIDEM TARTRATE 10 MG PO TABS: 10.0000 mg | ORAL_TABLET | Freq: Every evening | ORAL | 3 refills | Status: DC | PRN

## 2018-03-08 NOTE — Progress Notes (Signed)
Ambien Prescription sent to pharmacy   

## 2018-03-11 ENCOUNTER — Encounter: Payer: Self-pay | Admitting: Physical Therapy

## 2018-03-11 ENCOUNTER — Ambulatory Visit: Payer: BLUE CROSS/BLUE SHIELD | Admitting: Physical Therapy

## 2018-03-11 DIAGNOSIS — M6281 Muscle weakness (generalized): Secondary | ICD-10-CM

## 2018-03-11 DIAGNOSIS — M25662 Stiffness of left knee, not elsewhere classified: Secondary | ICD-10-CM

## 2018-03-11 DIAGNOSIS — M25562 Pain in left knee: Secondary | ICD-10-CM

## 2018-03-11 DIAGNOSIS — R600 Localized edema: Secondary | ICD-10-CM | POA: Diagnosis not present

## 2018-03-11 NOTE — Therapy (Addendum)
[facility] [place], [place], [address] Phone: [phone]   Fax:  [phone]  Physical Therapy Treatment  Patient Details  Name: Chase Stout MRN: [id number] Date of Birth: 06-09-1981 Referring Provider: Rodell Perna MD   Encounter Date: 03/11/2018  PT End of Session - 03/11/18 0742    Visit Number  9    Number of Visits  18    Date for PT Re-Evaluation  04/18/18    Authorization Type  FOTO AT LEAST EVERY 5TH VISIT, 10TH VISIT PROGRESS NOTE.    PT Start Time  [phone]    PT Stop Time  0826    PT Time Calculation (min)  53 min    Activity Tolerance  Patient tolerated treatment well    Behavior During Therapy  [facility] for tasks assessed/performed       Past Medical History:  Diagnosis Date  . Allergy   . Hydrocele 01/31/1999   small hydroceles bilaterally, small calcified loose body in left hemiscrotum  . Kidney stones   . Osteoarthritis of left shoulder 06/18/2012  . Sinus infection     Past Surgical History:  Procedure Laterality Date  . EYE SURGERY Bilateral    Lasix  . KNEE ARTHROSCOPY    . LITHOTRIPSY    . NASAL SINUS SURGERY    . SHOULDER HEMI-ARTHROPLASTY  09/24/2012   Procedure: SHOULDER HEMI-ARTHROPLASTY;  Surgeon: Johnny Bridge, MD;  Location: [facility];  Service: Orthopedics;  Laterality: Left;    There were no vitals filed for this visit.  Subjective Assessment - 03/11/18 0741    Subjective  Reports feeling good upon arrival. Reports slipping while trying to get into a chair sunday and had increased pain. The LLE stayed planted and RLE went out from under him with increased L knee flexion. Patient reported no less instability following incident and being able to walk. Reports popping that has occured more recently.    Patient Stated Goals  Get back to full duty work.    Currently in Pain?  No/denies         [facility] PT Assessment - 03/11/18 0001      Assessment   Medical Diagnosis  Rupture of ACL, left  knee.    Onset Date/Surgical Date  02/04/18    Next MD Visit  03/14/2018      ROM / Strength   AROM / PROM / Strength  Strength      Strength   Overall Strength  Within functional limits for tasks performed    Strength Assessment Site  Hip;Knee    Right/Left Hip  Left    Left Hip Flexion  5/5    Left Hip ABduction  4+/5    Right/Left Knee  Left    Left Knee Flexion  4+/5    Left Knee Extension  4+/5                   [facility] Adult PT Treatment/Exercise - 03/11/18 0001      Ambulation/Gait   Stairs  Yes    Stairs Assistance  6: Modified independent (Device/Increase time)    Stair Management Technique  One rail Right;Alternating pattern;Forwards    Number of Stairs  4 x2 RT    Height of Stairs  6.5      Knee/Hip Exercises: Aerobic   Stationary Bike  L3 x15 min      Knee/Hip Exercises: Standing   Heel Raises  Both;2 sets;10 reps;Other (comment) B toe raise x20  reps    Terminal Knee Extension  Strengthening;Left;2 sets;10 reps;Theraband    Theraband Level (Terminal Knee Extension)  Level 3 (Green)    Forward Step Up  Left;3 sets;10 reps;Hand Hold: 2;Step Height: 4"    SLS  LLE SLS on floor x1 min      Knee/Hip Exercises: Supine   Bridges with Ball Squeeze  Strengthening;2 sets;10 reps    Straight Leg Raises  Strengthening;Left;3 sets;10 reps focus on eccentric control    Straight Leg Raise with External Rotation  Strengthening;Left;2 sets;10 reps focus on eccentric control      Knee/Hip Exercises: Sidelying   Hip ABduction  AROM;Left;2 sets;10 reps      Vasopneumatic   Number Minutes Vasopneumatic   15 minutes    Vasopnuematic Location   Knee    Vasopneumatic Pressure  Medium    Vasopneumatic Temperature   34                  PT Long Term Goals - 03/11/18 0321      PT LONG TERM GOAL #1   Title  Independent with a HEP.    Time  9    Period  Weeks    Status  Achieved      PT LONG TERM GOAL #2   Title  Full active left knee extension in order  to normalize gait.    Time  9    Period  Weeks    Status  Achieved      PT LONG TERM GOAL #3   Title  Active knee flexion to 115 degrees+ so the patient can perform functional tasks and do so with pain not > 2-3/10.    Time  9    Period  Weeks    Status  Achieved      PT LONG TERM GOAL #4   Title  Increase right hip and  knee strength to a solid 4+/5 to provide good stability for accomplishment of functional activities.    Time  9    Period  Weeks    Status  Achieved      PT LONG TERM GOAL #5   Title  Perform a reciprocating stair gait with one railing with pain not > 2-3/10.    Time  9    Period  Weeks    Status  Partially Met            Plan - 03/11/18 2248    Clinical Impression Statement  Patient tolerated today's treatment well although he reports the slipping incident yesterday but no current pain upon arrival. Patient able to complete exercises well and able to balance well in LLE SLS without RLE compensation or UE support. Patient able to ascend and descend stairs fairly well although LLE weakness noted greatly with descending stairs. Supine strengthening exercises completed with focus on slow, eccentric control. LLE 4+/5-5/5 MMT assessed. Met all goals at this time except for full stair goal due to LLE weakness. Normal vasopneumatic response noted following removal of the modality.    Rehab Potential  Excellent    PT Treatment/Interventions  ADLs/Self Care Home Management;Cryotherapy;Electrical Stimulation;Functional mobility training;Therapeutic activities;Therapeutic exercise;Neuromuscular re-education;Patient/family education;Passive range of motion;Manual techniques;Vasopneumatic Device    PT Next Visit Plan  Continue L hip/knee strengthening per MD discretion and protocol.    Consulted and Agree with Plan of Care  Patient       Patient will benefit from skilled therapeutic intervention in order to improve the following deficits and impairments:  Abnormal gait,  Decreased activity tolerance, Decreased range of motion, Decreased strength, Increased edema, Pain  Visit Diagnosis: Localized edema  Muscle weakness (generalized)  Stiffness of left knee, not elsewhere classified  Acute pain of left knee     Problem List Patient Active Problem List   Diagnosis Date Noted  . S/P ACL reconstruction 02/12/2018  . Left anterior cruciate ligament tear 01/24/2018  . GERD (gastroesophageal reflux disease) 06/14/2015  . Asthma 04/29/2015  . Obesity 04/29/2015  . Vasomotor rhinitis 09/01/2014  . Insomnia 11/22/2012  . Anxiety 11/22/2012  . Rotator cuff dysfunction 06/18/2012  . Osteoarthritis of left shoulder 06/18/2012  . Shoulder pain, left 06/18/2012    Standley Brooking, PTA 03/11/18 8:26 AM  Mali Applegate MPT  [facility] [place], [place], [address] Phone: [phone]   Fax:  [phone]  Name: DAIVON RAYOS MRN: [id number] Date of Birth: Nov 26, 1980

## 2018-03-12 ENCOUNTER — Other Ambulatory Visit: Payer: Self-pay | Admitting: Family Medicine

## 2018-03-14 ENCOUNTER — Ambulatory Visit (INDEPENDENT_AMBULATORY_CARE_PROVIDER_SITE_OTHER): Payer: BLUE CROSS/BLUE SHIELD | Admitting: Orthopaedic Surgery

## 2018-03-14 ENCOUNTER — Encounter (INDEPENDENT_AMBULATORY_CARE_PROVIDER_SITE_OTHER): Payer: Self-pay | Admitting: Orthopaedic Surgery

## 2018-03-14 VITALS — BP 118/91 | HR 74 | Ht 65.0 in | Wt 220.0 lb

## 2018-03-14 DIAGNOSIS — Z9889 Other specified postprocedural states: Secondary | ICD-10-CM

## 2018-03-14 NOTE — Progress Notes (Signed)
   Post-Op Visit Note   Patient: Chase Stout           Date of Birth: 06/06/1981           MRN: [id number] Visit Date: 03/14/2018 PCP: Elenora GammaBradshaw, Samuel L, MD   Assessment & Plan: Post left knee ACL reconstruction.  He is doing well with therapy full extension flexion past 130.  He is walking without a cane or crutches.  He is doing modified work I will recheck him in 2 months.  Patient works for the [employer] and will need to have normal knee function before he resumes a regular duty due to his job requirements.  Chief Complaint:  Chief Complaint  Patient presents with  . Left Knee - Routine Post Op, Follow-up   Visit Diagnoses:  1. S/P ACL reconstruction     Plan: Recheck 2 months.  Continue modified duty.  Follow-Up Instructions: Return in about 2 months (around 05/15/2018).   Orders:  No orders of the defined types were placed in this encounter.  No orders of the defined types were placed in this encounter.   Imaging: No results found.  PMFS History: Patient Active Problem List   Diagnosis Date Noted  . S/P ACL reconstruction 02/12/2018  . GERD (gastroesophageal reflux disease) 06/14/2015  . Asthma 04/29/2015  . Obesity 04/29/2015  . Vasomotor rhinitis 09/01/2014  . Insomnia 11/22/2012  . Anxiety 11/22/2012  . Rotator cuff dysfunction 06/18/2012  . Osteoarthritis of left shoulder 06/18/2012  . Shoulder pain, left 06/18/2012   Past Medical History:  Diagnosis Date  . Allergy   . Hydrocele 01/31/1999   small hydroceles bilaterally, small calcified loose body in left hemiscrotum  . Kidney stones   . Osteoarthritis of left shoulder 06/18/2012  . Sinus infection     Family History  Problem Relation Age of Onset  . Hypertension Mother   . Cancer Father        lung  . Hypertension Brother     Past Surgical History:  Procedure Laterality Date  . EYE SURGERY Bilateral    Lasix  . KNEE ARTHROSCOPY Left 02/04/2018   Left ACL  . LITHOTRIPSY    .  NASAL SINUS SURGERY    . SHOULDER HEMI-ARTHROPLASTY  09/24/2012   Procedure: SHOULDER HEMI-ARTHROPLASTY;  Surgeon: Eulas PostJoshua P Landau, MD;  Location: [facility] OR;  Service: Orthopedics;  Laterality: Left;   Social History   Occupational History    Employer: [employer]  Tobacco Use  . Smoking status: Never Smoker  . Smokeless tobacco: Never Used  Substance and Sexual Activity  . Alcohol use: Yes    Alcohol/week: 1.2 oz    Types: 2 Cans of beer per week    Comment: occasionally  . Drug use: No  . Sexual activity: Not on file

## 2018-03-18 ENCOUNTER — Encounter: Payer: Self-pay | Admitting: Physical Therapy

## 2018-03-18 ENCOUNTER — Ambulatory Visit: Payer: BLUE CROSS/BLUE SHIELD | Admitting: Physical Therapy

## 2018-03-18 DIAGNOSIS — M25662 Stiffness of left knee, not elsewhere classified: Secondary | ICD-10-CM

## 2018-03-18 DIAGNOSIS — M6281 Muscle weakness (generalized): Secondary | ICD-10-CM | POA: Diagnosis not present

## 2018-03-18 DIAGNOSIS — M25562 Pain in left knee: Secondary | ICD-10-CM | POA: Diagnosis not present

## 2018-03-18 DIAGNOSIS — R600 Localized edema: Secondary | ICD-10-CM | POA: Diagnosis not present

## 2018-03-18 NOTE — Therapy (Addendum)
[facility] [place], [place], [address] Phone: [phone]   Fax:  [phone]  Physical Therapy Treatment   Progress Note Reporting Period 02/14/18 to 03/18/18  See note below for Objective Data and Assessment of Progress/Goals.     Patient Details  Name: Chase Stout MRN: [id number] Date of Birth: 1981/03/18 Referring Provider: Rodell Perna MD   Encounter Date: 03/18/2018  PT End of Session - 03/18/18 1548    Visit Number  10    Number of Visits  18    Date for PT Re-Evaluation  04/18/18    Authorization Type  FOTO AT LEAST EVERY 5TH VISIT, 10TH VISIT PROGRESS NOTE.    PT Start Time  1545    PT Stop Time  1632    PT Time Calculation (min)  47 min    Activity Tolerance  Patient tolerated treatment well    Behavior During Therapy  [facility] for tasks assessed/performed       Past Medical History:  Diagnosis Date  . Allergy   . Hydrocele 01/31/1999   small hydroceles bilaterally, small calcified loose body in left hemiscrotum  . Kidney stones   . Osteoarthritis of left shoulder 06/18/2012  . Sinus infection     Past Surgical History:  Procedure Laterality Date  . EYE SURGERY Bilateral    Lasix  . KNEE ARTHROSCOPY Left 02/04/2018   Left ACL  . LITHOTRIPSY    . NASAL SINUS SURGERY    . SHOULDER HEMI-ARTHROPLASTY  09/24/2012   Procedure: SHOULDER HEMI-ARTHROPLASTY;  Surgeon: Johnny Bridge, MD;  Location: [facility];  Service: Orthopedics;  Laterality: Left;    There were no vitals filed for this visit.  Subjective Assessment - 03/18/18 1547    Subjective  Reports that MD visit went well and he was pleased.    Patient Stated Goals  Get back to full duty work.    Currently in Pain?  No/denies         [facility] PT Assessment - 03/18/18 0001      Assessment   Medical Diagnosis  Rupture of ACL, left knee.    Onset Date/Surgical Date  02/04/18    Next MD Visit  05/2018      ROM / Strength   AROM / PROM /  Strength  AROM      AROM   Overall AROM   Within functional limits for tasks performed    AROM Assessment Site  Knee    Right/Left Knee  Left    Left Knee Extension  0    Left Knee Flexion  134                   [facility] Adult PT Treatment/Exercise - 03/18/18 0001      Knee/Hip Exercises: Aerobic   Stationary Bike  L4 x15 min      Knee/Hip Exercises: Machines for Strengthening   Cybex Leg Press  3 pl, seat 7 x30 reps      Knee/Hip Exercises: Standing   Heel Raises  Both;2 sets;10 reps;Other (comment) B toe raise    Terminal Knee Extension  Strengthening;Left;3 sets;10 reps;Theraband    Theraband Level (Terminal Knee Extension)  Level 3 (Green)    Lateral Step Up  Left;3 sets;10 reps;Hand Hold: 2;Step Height: 6"    Forward Step Up  Left;3 sets;10 reps;Hand Hold: 2;Step Height: 6"    Step Down  Left;2 sets;10 reps;Hand Hold: 2;Step Height: 4"    SLS  LLE  SLS on floor to balance pods x7 reps    Other Standing Knee Exercises  L HS curl 4# x30 reps      Knee/Hip Exercises: Supine   Bridges with Diona Foley Squeeze  Strengthening;2 sets;10 reps    Straight Leg Raise with External Rotation  Strengthening;Left;3 sets;10 reps      Knee/Hip Exercises: Sidelying   Hip ABduction  AROM;Left;10 reps;3 sets                  PT Long Term Goals - 03/11/18 6270      PT LONG TERM GOAL #1   Title  Independent with a HEP.    Time  9    Period  Weeks    Status  Achieved      PT LONG TERM GOAL #2   Title  Full active left knee extension in order to normalize gait.    Time  9    Period  Weeks    Status  Achieved      PT LONG TERM GOAL #3   Title  Active knee flexion to 115 degrees+ so the patient can perform functional tasks and do so with pain not > 2-3/10.    Time  9    Period  Weeks    Status  Achieved      PT LONG TERM GOAL #4   Title  Increase right hip and  knee strength to a solid 4+/5 to provide good stability for accomplishment of functional activities.     Time  9    Period  Weeks    Status  Achieved      PT LONG TERM GOAL #5   Title  Perform a reciprocating stair gait with one railing with pain not > 2-3/10.    Time  9    Period  Weeks    Status  Partially Met            Plan - 03/18/18 1646    Clinical Impression Statement  Patient tolerated today's treatment very well and was progressed through more strengthening exercises per protocol. Weakness of LLE especially quad and calf noted throughout treatment. Decreased L VMO contration noted although patient able to complete SLRs very well with no extensor lag. Resisted leg press also initated today without complaint of pain. AROM of L knee measured as 0-134 deg. 10th visit FOTO score 32%. Patient still hesitant with any type of running activity at this time due to fear of reinjury.    Rehab Potential  Excellent    PT Treatment/Interventions  ADLs/Self Care Home Management;Cryotherapy;Electrical Stimulation;Functional mobility training;Therapeutic activities;Therapeutic exercise;Neuromuscular re-education;Patient/family education;Passive range of motion;Manual techniques;Vasopneumatic Device    PT Next Visit Plan  Continue L hip/knee strengthening per MD discretion and protocol.    Consulted and Agree with Plan of Care  Patient       Patient will benefit from skilled therapeutic intervention in order to improve the following deficits and impairments:  Abnormal gait, Decreased activity tolerance, Decreased range of motion, Decreased strength, Increased edema, Pain  Visit Diagnosis: Localized edema  Muscle weakness (generalized)  Stiffness of left knee, not elsewhere classified  Acute pain of left knee     Problem List Patient Active Problem List   Diagnosis Date Noted  . S/P ACL reconstruction 02/12/2018  . GERD (gastroesophageal reflux disease) 06/14/2015  . Asthma 04/29/2015  . Obesity 04/29/2015  . Vasomotor rhinitis 09/01/2014  . Insomnia 11/22/2012  . Anxiety  11/22/2012  . Rotator cuff dysfunction  06/18/2012  . Osteoarthritis of left shoulder 06/18/2012  . Shoulder pain, left 06/18/2012    Standley Brooking, PTA 03/18/2018, 4:52 PM  [facility] [address] [place], [place], [address] Phone: [phone]   Fax:  [phone]  Name: Chase Stout MRN: [id number] Date of Birth: [birth year]

## 2018-03-26 ENCOUNTER — Ambulatory Visit: Payer: BLUE CROSS/BLUE SHIELD | Admitting: Physical Therapy

## 2018-03-26 ENCOUNTER — Encounter: Payer: Self-pay | Admitting: Physical Therapy

## 2018-03-26 DIAGNOSIS — M25562 Pain in left knee: Secondary | ICD-10-CM | POA: Diagnosis not present

## 2018-03-26 DIAGNOSIS — M6281 Muscle weakness (generalized): Secondary | ICD-10-CM

## 2018-03-26 DIAGNOSIS — R600 Localized edema: Secondary | ICD-10-CM | POA: Diagnosis not present

## 2018-03-26 DIAGNOSIS — M25662 Stiffness of left knee, not elsewhere classified: Secondary | ICD-10-CM

## 2018-03-26 NOTE — Therapy (Signed)
[facility] [place], [place], [address] Phone: [phone]   Fax:  [phone]  Physical Therapy Treatment  Patient Details  Name: Chase Stout MRN: [id number] Date of Birth: 1980-11-19 Referring Provider: Rodell Perna MD   Encounter Date: 03/26/2018  PT End of Session - 03/26/18 1622    Visit Number  11    Number of Visits  18    Date for PT Re-Evaluation  04/18/18    Authorization Type  FOTO AT LEAST EVERY 5TH VISIT, 10TH VISIT PROGRESS NOTE.    PT Start Time  0340    PT Stop Time  0435    PT Time Calculation (min)  55 min    Activity Tolerance  Patient tolerated treatment well    Behavior During Therapy  [facility] for tasks assessed/performed       Past Medical History:  Diagnosis Date  . Allergy   . Hydrocele 01/31/1999   small hydroceles bilaterally, small calcified loose body in left hemiscrotum  . Kidney stones   . Osteoarthritis of left shoulder 06/18/2012  . Sinus infection     Past Surgical History:  Procedure Laterality Date  . EYE SURGERY Bilateral    Lasix  . KNEE ARTHROSCOPY Left 02/04/2018   Left ACL  . LITHOTRIPSY    . NASAL SINUS SURGERY    . SHOULDER HEMI-ARTHROPLASTY  09/24/2012   Procedure: SHOULDER HEMI-ARTHROPLASTY;  Surgeon: Johnny Bridge, MD;  Location: [facility];  Service: Orthopedics;  Laterality: Left;    There were no vitals filed for this visit.  Subjective Assessment - 03/26/18 1621    Subjective  Doctor was very pleased and siad I was ahead of schedule.  Out of brace now.    Currently in Pain?  Yes    Pain Score  3     Pain Location  Knee    Pain Orientation  Left                       [facility] Adult PT Treatment/Exercise - 03/26/18 0001      Exercises   Exercises  Knee/Hip      Knee/Hip Exercises: Aerobic   Stationary Bike  Progressed to level 6 over 15 minutes.      Knee/Hip Exercises: Standing   Other Standing Knee Exercises  Red Theraband standing TKE's  3 sets of 20 reps.      Modalities   Modalities  Vasopneumatic      Vasopneumatic   Number Minutes Vasopneumatic   15 minutes    Vasopnuematic Location   -- Left knee.    Vasopneumatic Pressure  Medium          Balance Exercises - 03/26/18 1626      Balance Exercises: Standing   Other Standing Exercises  Inverted BOSU x 4 minutes with bilateral knees flexed at ~ 25 degrees and then rebounder x 4 minutes.             PT Long Term Goals - 03/11/18 2863      PT LONG TERM GOAL #1   Title  Independent with a HEP.    Time  9    Period  Weeks    Status  Achieved      PT LONG TERM GOAL #2   Title  Full active left knee extension in order to normalize gait.    Time  9    Period  Weeks    Status  Achieved  PT LONG TERM GOAL #3   Title  Active knee flexion to 115 degrees+ so the patient can perform functional tasks and do so with pain not > 2-3/10.    Time  9    Period  Weeks    Status  Achieved      PT LONG TERM GOAL #4   Title  Increase right hip and  knee strength to a solid 4+/5 to provide good stability for accomplishment of functional activities.    Time  9    Period  Weeks    Status  Achieved      PT LONG TERM GOAL #5   Title  Perform a reciprocating stair gait with one railing with pain not > 2-3/10.    Time  9    Period  Weeks    Status  Partially Met            Plan - 03/26/18 1631    Clinical Impression Statement  Excellent job today with the addition of proprioceptive exercises.    PT Treatment/Interventions  ADLs/Self Care Home Management;Cryotherapy;Electrical Stimulation;Functional mobility training;Therapeutic activities;Therapeutic exercise;Neuromuscular re-education;Patient/family education;Passive range of motion;Manual techniques;Vasopneumatic Device    PT Next Visit Plan  Continue L hip/knee strengthening per MD discretion and protocol.    Consulted and Agree with Plan of Care  Patient       Patient will benefit from skilled  therapeutic intervention in order to improve the following deficits and impairments:  Abnormal gait, Decreased activity tolerance, Decreased range of motion, Decreased strength, Increased edema, Pain  Visit Diagnosis: Localized edema  Muscle weakness (generalized)  Stiffness of left knee, not elsewhere classified  Acute pain of left knee     Problem List Patient Active Problem List   Diagnosis Date Noted  . S/P ACL reconstruction 02/12/2018  . GERD (gastroesophageal reflux disease) 06/14/2015  . Asthma 04/29/2015  . Obesity 04/29/2015  . Vasomotor rhinitis 09/01/2014  . Insomnia 11/22/2012  . Anxiety 11/22/2012  . Rotator cuff dysfunction 06/18/2012  . Osteoarthritis of left shoulder 06/18/2012  . Shoulder pain, left 06/18/2012    [place], [place]  MPT 03/26/2018, 4:37 PM  [facility] [address] [place], [place], [address] Phone: [phone]   Fax:  [phone]  Name: Chase Stout MRN: [id number] Date of Birth: Mar 31, 1981

## 2018-03-28 ENCOUNTER — Ambulatory Visit (INDEPENDENT_AMBULATORY_CARE_PROVIDER_SITE_OTHER): Payer: BLUE CROSS/BLUE SHIELD | Admitting: *Deleted

## 2018-03-28 DIAGNOSIS — Z23 Encounter for immunization: Secondary | ICD-10-CM

## 2018-03-28 NOTE — Progress Notes (Signed)
Pt given 2nd Twinrix vaccine Tolerated well 

## 2018-04-01 ENCOUNTER — Encounter: Payer: Self-pay | Admitting: Physical Therapy

## 2018-04-01 ENCOUNTER — Ambulatory Visit: Payer: BLUE CROSS/BLUE SHIELD | Admitting: Physical Therapy

## 2018-04-01 DIAGNOSIS — R600 Localized edema: Secondary | ICD-10-CM | POA: Diagnosis not present

## 2018-04-01 DIAGNOSIS — M6281 Muscle weakness (generalized): Secondary | ICD-10-CM

## 2018-04-01 DIAGNOSIS — M25562 Pain in left knee: Secondary | ICD-10-CM | POA: Diagnosis not present

## 2018-04-01 DIAGNOSIS — M25662 Stiffness of left knee, not elsewhere classified: Secondary | ICD-10-CM | POA: Diagnosis not present

## 2018-04-01 NOTE — Therapy (Signed)
[facility] [place], [place], [address] Phone: [phone]   Fax:  [phone]  Physical Therapy Treatment  Patient Details  Name: Chase Stout MRN: [id number] Date of Birth: 10/21/1980 Referring Provider: Rodell Perna MD   Encounter Date: 04/01/2018  PT End of Session - 04/01/18 1306    Visit Number  12    Number of Visits  18    Date for PT Re-Evaluation  04/18/18    Authorization Type  --    PT Start Time  1572    PT Stop Time  1345    PT Time Calculation (min)  42 min    Activity Tolerance  Patient tolerated treatment well    Behavior During Therapy  [facility] for tasks assessed/performed       Past Medical History:  Diagnosis Date  . Allergy   . Hydrocele 01/31/1999   small hydroceles bilaterally, small calcified loose body in left hemiscrotum  . Kidney stones   . Osteoarthritis of left shoulder 06/18/2012  . Sinus infection     Past Surgical History:  Procedure Laterality Date  . EYE SURGERY Bilateral    Lasix  . KNEE ARTHROSCOPY Left 02/04/2018   Left ACL  . LITHOTRIPSY    . NASAL SINUS SURGERY    . SHOULDER HEMI-ARTHROPLASTY  09/24/2012   Procedure: SHOULDER HEMI-ARTHROPLASTY;  Surgeon: Johnny Bridge, MD;  Location: [facility];  Service: Orthopedics;  Laterality: Left;    There were no vitals filed for this visit.  Subjective Assessment - 04/01/18 1306    Subjective  Reports that his knee has been swollen today the most it has in a while. Reports that he has also experienced L knee buckling at times as well.    Patient Stated Goals  Get back to full duty work.    Currently in Pain?  No/denies         [facility] PT Assessment - 04/01/18 0001      Assessment   Medical Diagnosis  Rupture of ACL, left knee.    Onset Date/Surgical Date  02/04/18    Next MD Visit  05/2018                   [facility] Adult PT Treatment/Exercise - 04/01/18 0001      Knee/Hip Exercises: Aerobic   Elliptical  L1, R2  x10 min    Nustep  L7 x10 min      Knee/Hip Exercises: Machines for Strengthening   Cybex Knee Extension  10# 3x10 reps    Cybex Leg Press  4 pl ,seat 5, 2x20 reps      Knee/Hip Exercises: Standing   Heel Raises  Left;3 sets;20 reps    Terminal Knee Extension  Strengthening;Left;3 sets;10 reps;Theraband    Theraband Level (Terminal Knee Extension)  Level 3 (Green)    Step Down  Left;2 sets;10 reps;Hand Hold: 1;Step Height: 6"    Other Standing Knee Exercises  L heel dot 4" step 3x10 reps                  PT Long Term Goals - 03/11/18 6203      PT LONG TERM GOAL #1   Title  Independent with a HEP.    Time  9    Period  Weeks    Status  Achieved      PT LONG TERM GOAL #2   Title  Full active left knee extension in order to normalize gait.  Time  9    Period  Weeks    Status  Achieved      PT LONG TERM GOAL #3   Title  Active knee flexion to 115 degrees+ so the patient can perform functional tasks and do so with pain not > 2-3/10.    Time  9    Period  Weeks    Status  Achieved      PT LONG TERM GOAL #4   Title  Increase right hip and  knee strength to a solid 4+/5 to provide good stability for accomplishment of functional activities.    Time  9    Period  Weeks    Status  Achieved      PT LONG TERM GOAL #5   Title  Perform a reciprocating stair gait with one railing with pain not > 2-3/10.    Time  9    Period  Weeks    Status  Partially Met            Plan - 04/01/18 1417    Clinical Impression Statement  Patient tolerated today's treatment well with no complaints of pain but did report intermittant L knee buckling. Patient guided through protocol appropriate knee strengthening with no complaints of knee pain. Patient able to complete all exercises as directed with weakness noted with eccentric descending and along L VMO. Machine knee extension initated today within protocol limitations with light resistance and no complaints per patient. Continued  L gastroc atrophy noted and only minimally to moderately L superior knee edema present upon observation.    Rehab Potential  Excellent    PT Treatment/Interventions  ADLs/Self Care Home Management;Cryotherapy;Electrical Stimulation;Functional mobility training;Therapeutic activities;Therapeutic exercise;Neuromuscular re-education;Patient/family education;Passive range of motion;Manual techniques;Vasopneumatic Device    PT Next Visit Plan  Continue L hip/knee strengthening per MD discretion and protocol.    Consulted and Agree with Plan of Care  Patient       Patient will benefit from skilled therapeutic intervention in order to improve the following deficits and impairments:  Abnormal gait, Decreased activity tolerance, Decreased range of motion, Decreased strength, Increased edema, Pain  Visit Diagnosis: Localized edema  Muscle weakness (generalized)  Stiffness of left knee, not elsewhere classified  Acute pain of left knee     Problem List Patient Active Problem List   Diagnosis Date Noted  . S/P ACL reconstruction 02/12/2018  . GERD (gastroesophageal reflux disease) 06/14/2015  . Asthma 04/29/2015  . Obesity 04/29/2015  . Vasomotor rhinitis 09/01/2014  . Insomnia 11/22/2012  . Anxiety 11/22/2012  . Rotator cuff dysfunction 06/18/2012  . Osteoarthritis of left shoulder 06/18/2012  . Shoulder pain, left 06/18/2012    Standley Brooking, PTA 04/01/2018, 2:32 PM  [facility] [address] [place], [place], [address] Phone: [phone]   Fax:  [phone]  Name: Chase Stout MRN: [id number] Date of Birth: 1981-04-13

## 2018-04-08 ENCOUNTER — Encounter: Payer: Self-pay | Admitting: Physical Therapy

## 2018-04-08 ENCOUNTER — Ambulatory Visit: Payer: BLUE CROSS/BLUE SHIELD | Attending: Orthopaedic Surgery | Admitting: Physical Therapy

## 2018-04-08 DIAGNOSIS — R600 Localized edema: Secondary | ICD-10-CM

## 2018-04-08 DIAGNOSIS — M25562 Pain in left knee: Secondary | ICD-10-CM

## 2018-04-08 DIAGNOSIS — M6281 Muscle weakness (generalized): Secondary | ICD-10-CM | POA: Diagnosis not present

## 2018-04-08 DIAGNOSIS — M25662 Stiffness of left knee, not elsewhere classified: Secondary | ICD-10-CM | POA: Insufficient documentation

## 2018-04-08 NOTE — Therapy (Signed)
[facility] [place], [place], [address] Phone: [phone]   Fax:  [phone]  Physical Therapy Treatment  Patient Details  Name: Chase Stout MRN: [id number] Date of Birth: [birth year] Referring Provider: Rodell Perna MD   Encounter Date: 04/08/2018  PT End of Session - 04/08/18 1610    Visit Number  13    Number of Visits  18    Date for PT Re-Evaluation  04/18/18    Authorization Type  FOTO AT LEAST EVERY 5TH VISIT, 10TH VISIT PROGRESS NOTE.    PT Start Time  1559    PT Stop Time  1639    PT Time Calculation (min)  40 min    Activity Tolerance  Patient tolerated treatment well    Behavior During Therapy  [facility] for tasks assessed/performed       Past Medical History:  Diagnosis Date  . Allergy   . Hydrocele 01/31/1999   small hydroceles bilaterally, small calcified loose body in left hemiscrotum  . Kidney stones   . Osteoarthritis of left shoulder 06/18/2012  . Sinus infection     Past Surgical History:  Procedure Laterality Date  . EYE SURGERY Bilateral    Lasix  . KNEE ARTHROSCOPY Left 02/04/2018   Left ACL  . LITHOTRIPSY    . NASAL SINUS SURGERY    . SHOULDER HEMI-ARTHROPLASTY  09/24/2012   Procedure: SHOULDER HEMI-ARTHROPLASTY;  Surgeon: Johnny Bridge, MD;  Location: [facility];  Service: Orthopedics;  Laterality: Left;    There were no vitals filed for this visit.  Subjective Assessment - 04/08/18 1610    Subjective  No complaints upon arrival.    Patient Stated Goals  Get back to full duty work.    Currently in Pain?  No/denies         [facility] PT Assessment - 04/08/18 0001      Assessment   Medical Diagnosis  Rupture of ACL, left knee.    Onset Date/Surgical Date  02/04/18    Next MD Visit  05/2018                   [facility] Adult PT Treatment/Exercise - 04/08/18 0001      Knee/Hip Exercises: Aerobic   Stationary Bike  L4 x15 min      Knee/Hip Exercises: Machines for Strengthening    Cybex Knee Extension  20# 3x10 reps    Cybex Leg Press  3pl, seat 5, 3x10 reps      Knee/Hip Exercises: Standing   Terminal Knee Extension  Strengthening;Left;3 sets;10 reps;Theraband    Theraband Level (Terminal Knee Extension)  Other (comment) Pin kXTS    Step Down  Left;3 sets;10 reps;Hand Hold: 2;Step Height: 6"    Other Standing Knee Exercises  L heel dot 4" step 3x10 reps      Knee/Hip Exercises: Supine   Straight Leg Raises  Strengthening;Left;2 sets;10 reps    Straight Leg Raise with External Rotation  Strengthening;Left;3 sets;10 reps      Knee/Hip Exercises: Sidelying   Hip ABduction  AROM;Left;3 sets;10 reps                  PT Long Term Goals - 03/11/18 2263      PT LONG TERM GOAL #1   Title  Independent with a HEP.    Time  9    Period  Weeks    Status  Achieved      PT LONG TERM GOAL #2  Title  Full active left knee extension in order to normalize gait.    Time  9    Period  Weeks    Status  Achieved      PT LONG TERM GOAL #3   Title  Active knee flexion to 115 degrees+ so the patient can perform functional tasks and do so with pain not > 2-3/10.    Time  9    Period  Weeks    Status  Achieved      PT LONG TERM GOAL #4   Title  Increase right hip and  knee strength to a solid 4+/5 to provide good stability for accomplishment of functional activities.    Time  9    Period  Weeks    Status  Achieved      PT LONG TERM GOAL #5   Title  Perform a reciprocating stair gait with one railing with pain not > 2-3/10.    Time  9    Period  Weeks    Status  Partially Met            Plan - 04/08/18 1729    Clinical Impression Statement  Patient tolerated today's treatment well with weakness still noted with LLE only exercises such as SLR with ER, heel dot and SL bridge. Patient compliant with protocol appropriate exercises at the gym as well. No complaints of pain were reported throughout treatment.    Rehab Potential  Excellent    PT  Treatment/Interventions  ADLs/Self Care Home Management;Cryotherapy;Electrical Stimulation;Functional mobility training;Therapeutic activities;Therapeutic exercise;Neuromuscular re-education;Patient/family education;Passive range of motion;Manual techniques;Vasopneumatic Device    PT Next Visit Plan  Continue L hip/knee strengthening per MD discretion and protocol.    Consulted and Agree with Plan of Care  Patient       Patient will benefit from skilled therapeutic intervention in order to improve the following deficits and impairments:  Abnormal gait, Decreased activity tolerance, Decreased range of motion, Decreased strength, Increased edema, Pain  Visit Diagnosis: Localized edema  Muscle weakness (generalized)  Stiffness of left knee, not elsewhere classified  Acute pain of left knee     Problem List Patient Active Problem List   Diagnosis Date Noted  . S/P ACL reconstruction 02/12/2018  . GERD (gastroesophageal reflux disease) 06/14/2015  . Asthma 04/29/2015  . Obesity 04/29/2015  . Vasomotor rhinitis 09/01/2014  . Insomnia 11/22/2012  . Anxiety 11/22/2012  . Rotator cuff dysfunction 06/18/2012  . Osteoarthritis of left shoulder 06/18/2012  . Shoulder pain, left 06/18/2012    Standley Brooking, PTA 04/08/2018, 5:35 PM  [facility] [address] [place], [place], [address] Phone: [phone]   Fax:  [phone]  Name: Chase Stout MRN: [id number] Date of Birth: Dec 03, 1980

## 2018-04-09 ENCOUNTER — Other Ambulatory Visit: Payer: Self-pay | Admitting: Family Medicine

## 2018-04-15 ENCOUNTER — Ambulatory Visit: Payer: BLUE CROSS/BLUE SHIELD | Admitting: Physical Therapy

## 2018-04-15 ENCOUNTER — Encounter: Payer: Self-pay | Admitting: Physical Therapy

## 2018-04-15 DIAGNOSIS — M25662 Stiffness of left knee, not elsewhere classified: Secondary | ICD-10-CM

## 2018-04-15 DIAGNOSIS — R600 Localized edema: Secondary | ICD-10-CM | POA: Diagnosis not present

## 2018-04-15 DIAGNOSIS — M6281 Muscle weakness (generalized): Secondary | ICD-10-CM

## 2018-04-15 DIAGNOSIS — M25562 Pain in left knee: Secondary | ICD-10-CM

## 2018-04-15 NOTE — Therapy (Addendum)
[facility] [place], [place], [address] Phone: [phone]   Fax:  [phone]  Physical Therapy Treatment  Patient Details  Name: Chase Stout MRN: [id number] Date of Birth: 02/12/1981 Referring Provider: Rodell Perna MD   Encounter Date: 04/15/2018  PT End of Session - 04/15/18 1653    Visit Number  14    Number of Visits  18    Date for PT Re-Evaluation  04/18/18    Authorization Type  FOTO AT LEAST EVERY 5TH VISIT, 10TH VISIT PROGRESS NOTE.    PT Start Time  0354    PT Stop Time  0437    PT Time Calculation (min)  43 min       Past Medical History:  Diagnosis Date  . Allergy   . Hydrocele 01/31/1999   small hydroceles bilaterally, small calcified loose body in left hemiscrotum  . Kidney stones   . Osteoarthritis of left shoulder 06/18/2012  . Sinus infection     Past Surgical History:  Procedure Laterality Date  . EYE SURGERY Bilateral    Lasix  . KNEE ARTHROSCOPY Left 02/04/2018   Left ACL  . LITHOTRIPSY    . NASAL SINUS SURGERY    . SHOULDER HEMI-ARTHROPLASTY  09/24/2012   Procedure: SHOULDER HEMI-ARTHROPLASTY;  Surgeon: Johnny Bridge, MD;  Location: [facility];  Service: Orthopedics;  Laterality: Left;    There were no vitals filed for this visit.   Subjective Assessment - 04/15/18 1621    Subjective  I was in quite a bit of pain over the weekend but I'm doing good today.                    Objective measurements completed on examination: See above findings.      [facility] Adult PT Treatment/Exercise - 04/15/18 0001      Exercises   Exercises  Knee/Hip      Knee/Hip Exercises: Aerobic   Stationary Bike  Level 4 x 15 minutes.      Knee/Hip Exercises: Machines for Strengthening   Cybex Knee Extension  10# x 5 minutes 90 to 40 degrees.    Cybex Knee Flexion  30# x 5 minutes.    Cybex Leg Press  3 plates slow reps 3 sets x 15 reps.          Balance Exercises - 04/15/18 1631       Balance Exercises: Standing   Other Standing Exercises  Inverted BOSU x 4 minutes f/b forward and backward walking in mini-squat position x 6 minutes (3 minutes forward and 3 minutes backward) against the resistance of orange XTS band.               PT Long Term Goals - 03/11/18 9937      PT LONG TERM GOAL #1   Title  Independent with a HEP.    Time  9    Period  Weeks    Status  Achieved      PT LONG TERM GOAL #2   Title  Full active left knee extension in order to normalize gait.    Time  9    Period  Weeks    Status  Achieved      PT LONG TERM GOAL #3   Title  Active knee flexion to 115 degrees+ so the patient can perform functional tasks and do so with pain not > 2-3/10.    Time  9    Period  Weeks    Status  Achieved      PT LONG TERM GOAL #4   Title  Increase right hip and  knee strength to a solid 4+/5 to provide good stability for accomplishment of functional activities.    Time  9    Period  Weeks    Status  Achieved      PT LONG TERM GOAL #5   Title  Perform a reciprocating stair gait with one railing with pain not > 2-3/10.    Time  9    Period  Weeks    Status  Partially Met               Patient will benefit from skilled therapeutic intervention in order to improve the following deficits and impairments:     Visit Diagnosis: Localized edema  Muscle weakness (generalized)  Stiffness of left knee, not elsewhere classified  Acute pain of left knee     Problem List Patient Active Problem List   Diagnosis Date Noted  . S/P ACL reconstruction 02/12/2018  . GERD (gastroesophageal reflux disease) 06/14/2015  . Asthma 04/29/2015  . Obesity 04/29/2015  . Vasomotor rhinitis 09/01/2014  . Insomnia 11/22/2012  . Anxiety 11/22/2012  . Rotator cuff dysfunction 06/18/2012  . Osteoarthritis of left shoulder 06/18/2012  . Shoulder pain, left 06/18/2012    Leshawn Straka, [place] MPT 04/15/2018, 4:55 PM  [facility] [address] [place], [place], [address] Phone: [phone]   Fax:  [phone]  Name: Chase Stout MRN: [id number] Date of Birth: 18-Feb-1981

## 2018-04-22 ENCOUNTER — Ambulatory Visit: Payer: BLUE CROSS/BLUE SHIELD | Admitting: Physical Therapy

## 2018-04-22 ENCOUNTER — Encounter: Payer: Self-pay | Admitting: Physical Therapy

## 2018-04-22 DIAGNOSIS — M25562 Pain in left knee: Secondary | ICD-10-CM

## 2018-04-22 DIAGNOSIS — R600 Localized edema: Secondary | ICD-10-CM

## 2018-04-22 DIAGNOSIS — M25662 Stiffness of left knee, not elsewhere classified: Secondary | ICD-10-CM | POA: Diagnosis not present

## 2018-04-22 DIAGNOSIS — M6281 Muscle weakness (generalized): Secondary | ICD-10-CM

## 2018-04-22 NOTE — Therapy (Signed)
[facility] [place], [place], [address] Phone: [phone]   Fax:  [phone]  Physical Therapy Treatment  Patient Details  Name: JEANNE TERRANCE MRN: [id number] Date of Birth: [birth year] Referring Provider: Rodell Perna MD   Encounter Date: 04/22/2018  PT End of Session - 04/22/18 1536    Visit Number  15    Number of Visits  18    Date for PT Re-Evaluation  04/18/18    Authorization Type  FOTO AT LEAST EVERY 5TH VISIT, 10TH VISIT PROGRESS NOTE.    PT Start Time  0315    Activity Tolerance  Patient tolerated treatment well    Behavior During Therapy  [facility] for tasks assessed/performed       Past Medical History:  Diagnosis Date  . Allergy   . Hydrocele 01/31/1999   small hydroceles bilaterally, small calcified loose body in left hemiscrotum  . Kidney stones   . Osteoarthritis of left shoulder 06/18/2012  . Sinus infection     Past Surgical History:  Procedure Laterality Date  . EYE SURGERY Bilateral    Lasix  . KNEE ARTHROSCOPY Left 02/04/2018   Left ACL  . LITHOTRIPSY    . NASAL SINUS SURGERY    . SHOULDER HEMI-ARTHROPLASTY  09/24/2012   Procedure: SHOULDER HEMI-ARTHROPLASTY;  Surgeon: Johnny Bridge, MD;  Location: [facility];  Service: Orthopedics;  Laterality: Left;    There were no vitals filed for this visit.  Subjective Assessment - 04/22/18 1544    Subjective  I've been doing good.  We had an event over the weekend and I was on my feet a lot with not much pain.    Currently in Pain?  Yes    Pain Score  2     Pain Location  Knee    Pain Descriptors / Indicators  Sore    Pain Type  Surgical pain    Pain Onset  1 to 4 weeks ago                       [facility] Adult PT Treatment/Exercise - 04/22/18 0001      Exercises   Exercises  Knee/Hip      Knee/Hip Exercises: Aerobic   Stationary Bike  Level 4 x 15 minutes.      Knee/Hip Exercises: Machines for Strengthening   Cybex Knee  Extension  10# x 5 minutes.    Cybex Knee Flexion  30# x 5 minutes.    Cybex Leg Press  3 1/2 plates x 5 minutes.          Balance Exercises - 04/22/18 1601      Balance Exercises: Standing   Other Standing Exercises  Inverted BOSU ball x 4 minutes in ~25 degrees of bilateral kne flexion f/b resisted walking 2 minutes forward and 2 minutes backward against the resistance of orange XTS band.             PT Long Term Goals - 03/11/18 4081      PT LONG TERM GOAL #1   Title  Independent with a HEP.    Time  9    Period  Weeks    Status  Achieved      PT LONG TERM GOAL #2   Title  Full active left knee extension in order to normalize gait.    Time  9    Period  Weeks    Status  Achieved  PT LONG TERM GOAL #3   Title  Active knee flexion to 115 degrees+ so the patient can perform functional tasks and do so with pain not > 2-3/10.    Time  9    Period  Weeks    Status  Achieved      PT LONG TERM GOAL #4   Title  Increase right hip and  knee strength to a solid 4+/5 to provide good stability for accomplishment of functional activities.    Time  9    Period  Weeks    Status  Achieved      PT LONG TERM GOAL #5   Title  Perform a reciprocating stair gait with one railing with pain not > 2-3/10.    Time  9    Period  Weeks    Status  Partially Met            Plan - 04/22/18 1602    Clinical Impression Statement  Excellent job today.  On his feet a lot over last weekend and he said his knee felt quite good.    PT Treatment/Interventions  ADLs/Self Care Home Management;Cryotherapy;Electrical Stimulation;Functional mobility training;Therapeutic activities;Therapeutic exercise;Neuromuscular re-education;Patient/family education;Passive range of motion;Manual techniques;Vasopneumatic Device    PT Next Visit Plan  Continue L hip/knee strengthening per MD discretion and protocol.    Consulted and Agree with Plan of Care  Patient       Patient will benefit from  skilled therapeutic intervention in order to improve the following deficits and impairments:  Abnormal gait, Decreased activity tolerance, Decreased range of motion, Decreased strength, Increased edema, Pain  Visit Diagnosis: Localized edema  Muscle weakness (generalized)  Stiffness of left knee, not elsewhere classified  Acute pain of left knee     Problem List Patient Active Problem List   Diagnosis Date Noted  . S/P ACL reconstruction 02/12/2018  . GERD (gastroesophageal reflux disease) 06/14/2015  . Asthma 04/29/2015  . Obesity 04/29/2015  . Vasomotor rhinitis 09/01/2014  . Insomnia 11/22/2012  . Anxiety 11/22/2012  . Rotator cuff dysfunction 06/18/2012  . Osteoarthritis of left shoulder 06/18/2012  . Shoulder pain, left 06/18/2012    Mandolin Falwell, [place] MPT 04/22/2018, 4:05 PM  [facility] [address] [place], [place], [address] Phone: [phone]   Fax:  [phone]  Name: CLARICE BONAVENTURE MRN: [id number] Date of Birth: 30-Jan-1981

## 2018-04-26 ENCOUNTER — Other Ambulatory Visit: Payer: Self-pay | Admitting: *Deleted

## 2018-04-26 MED ORDER — MONTELUKAST SODIUM 10 MG PO TABS: 10.0000 mg | ORAL_TABLET | Freq: Every day | ORAL | 0 refills | Status: DC

## 2018-04-29 ENCOUNTER — Encounter: Payer: Self-pay | Admitting: Physical Therapy

## 2018-04-29 ENCOUNTER — Ambulatory Visit: Payer: BLUE CROSS/BLUE SHIELD | Admitting: Physical Therapy

## 2018-04-29 DIAGNOSIS — M25662 Stiffness of left knee, not elsewhere classified: Secondary | ICD-10-CM

## 2018-04-29 DIAGNOSIS — R600 Localized edema: Secondary | ICD-10-CM | POA: Diagnosis not present

## 2018-04-29 DIAGNOSIS — M6281 Muscle weakness (generalized): Secondary | ICD-10-CM

## 2018-04-29 DIAGNOSIS — M25562 Pain in left knee: Secondary | ICD-10-CM

## 2018-04-29 NOTE — Therapy (Addendum)
[facility] [place], [place], [address] Phone: [phone]   Fax:  [phone]  Physical Therapy Treatment  Patient Details  Name: Chase Stout MRN: [id number] Date of Birth: [birth year] Referring Provider: Rodell Perna MD   Encounter Date: 04/29/2018  PT End of Session - 04/29/18 1515    Visit Number  16    Number of Visits  18    Date for PT Re-Evaluation  04/18/18    Authorization Type  FOTO AT LEAST EVERY 5TH VISIT, 10TH VISIT PROGRESS NOTE.    PT Start Time  1515    PT Stop Time  1559    PT Time Calculation (min)  44 min    Activity Tolerance  Patient tolerated treatment well    Behavior During Therapy  WFL for tasks assessed/performed       Past Medical History:  Diagnosis Date  . Allergy   . Hydrocele 01/31/1999   small hydroceles bilaterally, small calcified loose body in left hemiscrotum  . Kidney stones   . Osteoarthritis of left shoulder 06/18/2012  . Sinus infection     Past Surgical History:  Procedure Laterality Date  . EYE SURGERY Bilateral    Lasix  . KNEE ARTHROSCOPY Left 02/04/2018   Left ACL  . LITHOTRIPSY    . NASAL SINUS SURGERY    . SHOULDER HEMI-ARTHROPLASTY  09/24/2012   Procedure: SHOULDER HEMI-ARTHROPLASTY;  Surgeon: Johnny Bridge, MD;  Location: [facility];  Service: Orthopedics;  Laterality: Left;    There were no vitals filed for this visit.  Subjective Assessment - 04/29/18 1515    Subjective  Reports that he has been doing the elliptical at the gym.    Patient Stated Goals  Get back to full duty work.    Currently in Pain?  Other (Comment)   No pain assessment provided        [facility] PT Assessment - 04/29/18 0001      Assessment   Medical Diagnosis  Rupture of ACL, left knee.    Onset Date/Surgical Date  02/04/18    Next MD Visit  05/2018                   [facility] Adult PT Treatment/Exercise - 04/29/18 0001      Ambulation/Gait   Stairs  Yes    Stairs  Assistance  7: Independent    Stair Management Technique  No rails;Alternating pattern;Forwards    Number of Stairs  4   x4 RT   Height of Stairs  6.5      Exercises   Exercises  Knee/Hip      Knee/Hip Exercises: Aerobic   Elliptical  L4, R4 x15 min      Knee/Hip Exercises: Machines for Strengthening   Cybex Knee Extension  20# x30 reps    Cybex Knee Flexion  40# x30 reps    Cybex Leg Press  3.5 pl, seat 7 x30 reps      Knee/Hip Exercises: Standing   Step Down  Left;3 sets;10 reps;Hand Hold: 2;Step Height: 6"    Wall Squat  15 reps;3 seconds      Knee/Hip Exercises: Supine   Bridges  15 reps   with red theraball for quad/HS stability and strengthening   Single Leg Bridge  Left;20 reps    Straight Leg Raise with External Rotation  Strengthening;Left;3 sets;10 reps                  PT  Long Term Goals - 04/29/18 1609      PT LONG TERM GOAL #1   Title  Independent with a HEP.    Time  9    Period  Weeks    Status  Achieved      PT LONG TERM GOAL #2   Title  Full active left knee extension in order to normalize gait.    Time  9    Period  Weeks    Status  Achieved      PT LONG TERM GOAL #3   Title  Active knee flexion to 115 degrees+ so the patient can perform functional tasks and do so with pain not > 2-3/10.    Time  9    Period  Weeks    Status  Achieved      PT LONG TERM GOAL #4   Title  Increase right hip and  knee strength to a solid 4+/5 to provide good stability for accomplishment of functional activities.    Time  9    Period  Weeks    Status  Achieved      PT LONG TERM GOAL #5   Title  Perform a reciprocating stair gait with one railing with pain not > 2-3/10.    Time  9    Period  Weeks    Status  Achieved            Plan - 04/29/18 1602    Clinical Impression Statement  Patient tolerated today's treatment well with no complaints of pain. Patient guided through more progressed knee strengthening exercises with no complaints at this  time. Patient able to complete all exercises with good form following the initial VCs for proper technique. Patient able to ambulate stairs with reciprical gait and no pain currently. Patient still experiences weakness while controlling descent with RLE. Patient okay with discharge as he met all goals. Final FOTO score 1%.    Rehab Potential  Excellent    PT Treatment/Interventions  ADLs/Self Care Home Management;Cryotherapy;Electrical Stimulation;Functional mobility training;Therapeutic activities;Therapeutic exercise;Neuromuscular re-education;Patient/family education;Passive range of motion;Manual techniques;Vasopneumatic Device    PT Next Visit Plan  D/C summary required.    Consulted and Agree with Plan of Care  Patient       Patient will benefit from skilled therapeutic intervention in order to improve the following deficits and impairments:  Abnormal gait, Decreased activity tolerance, Decreased range of motion, Decreased strength, Increased edema, Pain  Visit Diagnosis: Localized edema  Muscle weakness (generalized)  Stiffness of left knee, not elsewhere classified  Acute pain of left knee     Problem List Patient Active Problem List   Diagnosis Date Noted  . S/P ACL reconstruction 02/12/2018  . GERD (gastroesophageal reflux disease) 06/14/2015  . Asthma 04/29/2015  . Obesity 04/29/2015  . Vasomotor rhinitis 09/01/2014  . Insomnia 11/22/2012  . Anxiety 11/22/2012  . Rotator cuff dysfunction 06/18/2012  . Osteoarthritis of left shoulder 06/18/2012  . Shoulder pain, left 06/18/2012    Standley Brooking, PTA 04/29/18 [facility] [place], [place], [address] Phone: [phone]   Fax:  [phone]  Name: Chase Stout MRN: [id number] Date of Birth: Mar 28, 1981  PHYSICAL THERAPY DISCHARGE SUMMARY  Visits from Start of Care: 16.  Current functional level related to goals / functional outcomes: All  goals achieved.   Remaining deficits: All goal achieved.   Education / Equipment: HEP. Plan: Patient agrees to discharge.  Patient goals  were met. Patient is being discharged due to meeting the stated rehab goals.  ?????         Mali Applegate MPT

## 2018-05-07 ENCOUNTER — Encounter: Payer: Self-pay | Admitting: Physical Therapy

## 2018-05-08 ENCOUNTER — Other Ambulatory Visit: Payer: Self-pay | Admitting: Family Medicine

## 2018-05-09 NOTE — Telephone Encounter (Signed)
Patient aware and states that he didn't request a refill but will get in soon to see a new provider.

## 2018-05-13 DIAGNOSIS — E349 Endocrine disorder, unspecified: Secondary | ICD-10-CM | POA: Diagnosis not present

## 2018-05-16 ENCOUNTER — Ambulatory Visit (INDEPENDENT_AMBULATORY_CARE_PROVIDER_SITE_OTHER): Payer: BLUE CROSS/BLUE SHIELD | Admitting: Orthopaedic Surgery

## 2018-05-16 ENCOUNTER — Encounter (INDEPENDENT_AMBULATORY_CARE_PROVIDER_SITE_OTHER): Payer: Self-pay | Admitting: Orthopaedic Surgery

## 2018-05-16 VITALS — BP 119/84 | HR 91 | Ht 65.0 in | Wt 230.0 lb

## 2018-05-16 DIAGNOSIS — Z9889 Other specified postprocedural states: Secondary | ICD-10-CM | POA: Diagnosis not present

## 2018-05-16 NOTE — Progress Notes (Signed)
Office Visit Note   Patient: Chase Stout           Date of Birth: Apr 29, 1981           MRN: [id number] Visit Date: 05/16/2018              Requested by: Elenora Gamma, MD [address] [place], [place] [address] PCP: Elenora Gamma, MD   Assessment & Plan: Visit Diagnoses:  1. S/P ACL reconstruction     Plan: Patient is doing well post left ACL reconstruction.  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  He is 3 months post bone tendon bone reconstruction.  He can participate in course training lectures.  He has a firearm recertification coming up in 6 weeks.  I plan to recheck him in 1 month.  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  Return 1 month.  Follow-Up Instructions: No follow-ups on file.   Orders:  No orders of the defined types were placed in this encounter.  No orders of the defined types were placed in this encounter.     Procedures: No procedures performed   Clinical Data: No additional findings.   Subjective: Chief Complaint  Patient presents with  . Left Knee - Follow-up    02/04/18 Left Knee ACL reconstruction    HPI post left knee ACL bone tendon bone reconstruction 02/04/2018.  He is continuing postop rehab.  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  Review of Systems review of systems updated unchanged from surgery in June.   Objective: Vital Signs: BP 119/84   Pulse 91   Ht 5\' 5"  (1.651 m)   Wt 230 lb (104.3 kg)   BMI 38.27 kg/m   Physical Exam  Constitutional: He is oriented to person, place, and time. He appears well-developed and well-nourished.  HENT:  Head: Normocephalic and atraumatic.  Eyes: Pupils are equal, round, and reactive to light. EOM are normal.  Neck: No tracheal deviation present. No thyromegaly present.  Cardiovascular: Normal rate.  Pulmonary/Chest:  Effort normal. He has no wheezes.  Abdominal: Soft. Bowel sounds are normal.  Neurological: He is alert and oriented to person, place, and time.  Skin: Skin is warm and dry. Capillary refill takes less than 2 seconds.  Psychiatric: He has a normal mood and affect. His behavior is normal. Judgment and thought content normal.    Ortho Exam range of motion.  Trace Lockman right and left.  1+ anterior drawer with good endpoint both right knee and left knee.  Well-healed midline scar on his knee.  No knee effusion today.  Minimal crepitus with flexion extension of his knee.  Collateral ligaments are stable.  Negative pivot shift.  Specialty Comments:  No specialty comments available.  Imaging: No results found.   PMFS History: Patient Active Problem List   Diagnosis Date Noted  . S/P ACL reconstruction 02/12/2018  . GERD (gastroesophageal reflux disease) 06/14/2015  . Asthma 04/29/2015  . Obesity 04/29/2015  . Vasomotor rhinitis 09/01/2014  . Insomnia 11/22/2012  . Anxiety 11/22/2012  . Rotator cuff dysfunction 06/18/2012  . Osteoarthritis of left shoulder 06/18/2012  . Shoulder pain, left 06/18/2012   Past Medical History:  Diagnosis Date  . Allergy   . Hydrocele 01/31/1999   small hydroceles bilaterally, small calcified loose body in left hemiscrotum  .  Kidney stones   . Osteoarthritis of left shoulder 06/18/2012  . Sinus infection     Family History  Problem Relation Age of Onset  . Hypertension Mother   . Cancer Father        lung  . Hypertension Brother     Past Surgical History:  Procedure Laterality Date  . EYE SURGERY Bilateral    Lasix  . KNEE ARTHROSCOPY Left 02/04/2018   Left ACL  . LITHOTRIPSY    . NASAL SINUS SURGERY    . SHOULDER HEMI-ARTHROPLASTY  09/24/2012   Procedure: SHOULDER HEMI-ARTHROPLASTY;  Surgeon: Eulas PostJoshua P Landau, MD;  [facility];  Service: Orthopedics;  Laterality: Left;   Social History   Occupational History    Employer: [employer]  Tobacco Use  . Smoking status: Never Smoker  . Smokeless tobacco: Never Used  Substance and Sexual Activity  . Alcohol use: Yes    Alcohol/week: 2.0 standard drinks    Types: 2 Cans of beer per week    Comment: occasionally  . Drug use: No  . Sexual activity: Not on file

## 2018-05-22 ENCOUNTER — Other Ambulatory Visit: Payer: Self-pay | Admitting: Family Medicine

## 2018-05-27 ENCOUNTER — Other Ambulatory Visit: Payer: Self-pay | Admitting: *Deleted

## 2018-05-27 ENCOUNTER — Telehealth: Payer: BLUE CROSS/BLUE SHIELD | Admitting: Family

## 2018-05-27 DIAGNOSIS — J019 Acute sinusitis, unspecified: Secondary | ICD-10-CM

## 2018-05-27 MED ORDER — AMOXICILLIN-POT CLAVULANATE 875-125 MG PO TABS: 1.0000 | ORAL_TABLET | Freq: Two times a day (BID) | ORAL | 0 refills | Status: DC

## 2018-05-27 MED ORDER — FLUTICASONE FUROATE-VILANTEROL 100-25 MCG/INH IN AEPB: INHALATION_SPRAY | RESPIRATORY_TRACT | 0 refills | Status: DC

## 2018-05-27 NOTE — Progress Notes (Signed)

## 2018-06-03 ENCOUNTER — Ambulatory Visit: Payer: BLUE CROSS/BLUE SHIELD | Admitting: Physician Assistant

## 2018-06-03 ENCOUNTER — Encounter: Payer: Self-pay | Admitting: Physician Assistant

## 2018-06-03 VITALS — BP 124/83 | HR 77 | Temp 97.8°F | Ht 65.0 in | Wt 242.8 lb

## 2018-06-03 DIAGNOSIS — J209 Acute bronchitis, unspecified: Secondary | ICD-10-CM

## 2018-06-03 MED ORDER — ALBUTEROL SULFATE HFA 108 (90 BASE) MCG/ACT IN AERS
2.0000 | INHALATION_SPRAY | Freq: Four times a day (QID) | RESPIRATORY_TRACT | 0 refills | Status: DC | PRN
Start: 2018-06-03 — End: 2018-06-14

## 2018-06-03 MED ORDER — DOXYCYCLINE HYCLATE 100 MG PO TABS: 100.0000 mg | ORAL_TABLET | Freq: Two times a day (BID) | ORAL | 0 refills | Status: DC

## 2018-06-03 MED ORDER — METHYLPREDNISOLONE ACETATE 80 MG/ML IJ SUSP
80.0000 mg | Freq: Once | INTRAMUSCULAR | Status: AC
  Administered 2018-06-03: 80 mg via INTRAMUSCULAR

## 2018-06-03 NOTE — Patient Instructions (Signed)
In a few days you may receive a survey in the mail or online from [employer] regarding your visit with us today. Please take a moment to fill this out. Your feedback is very important to our whole office. It can help us better understand your needs as well as improve your experience and satisfaction. Thank you for taking your time to complete it. We care about you.  Yesli Vanderhoff, PA-C  

## 2018-06-03 NOTE — Progress Notes (Signed)
BP 124/83   Pulse 77   Temp 97.8 F (36.6 C) (Oral)   Ht _0  (1.651 m)   Wt 242 lb 12.8 oz (110.1 kg)   BMI 40.40 kg/m    Subjective:    Patient ID: TRACER GUTRIDGE, male    DOB: 1980-12-09, 37 y.o.   MRN: [id number]  HPI: Chase Stout is a 37 y.o. male presenting on 06/03/2018 for Cough and Hoarse  Patient with several days of progressing upper respiratory and bronchial symptoms. Initially there was more upper respiratory congestion. This progressed to having significant cough that is productive throughout the day and severe at night. There is occasional wheezing after coughing. Sometimes there is slight dyspnea on exertion. It is productive mucus that is yellow in color. Denies any blood.   Past Medical History:  Diagnosis Date  . Allergy   . Hydrocele 01/31/1999   small hydroceles bilaterally, small calcified loose body in left hemiscrotum  . Kidney stones   . Osteoarthritis of left shoulder 06/18/2012  . Sinus infection    Relevant past medical, surgical, family and social history reviewed and updated as indicated. Interim medical history since our last visit reviewed. Allergies and medications reviewed and updated. DATA REVIEWED: CHART IN EPIC  Family History reviewed for pertinent findings.  Review of Systems  Constitutional: Positive for fatigue. Negative for appetite change.  HENT: Positive for sinus pressure and sore throat.   Eyes: Negative.  Negative for pain and visual disturbance.  Respiratory: Positive for shortness of breath and wheezing. Negative for cough and chest tightness.   Cardiovascular: Negative.  Negative for chest pain, palpitations and leg swelling.  Gastrointestinal: Negative.  Negative for abdominal pain, diarrhea, nausea and vomiting.  Endocrine: Negative.   Genitourinary: Negative.   Musculoskeletal: Positive for back pain and myalgias.  Skin: Negative.  Negative for color change and rash.  Neurological: Positive for headaches.  Negative for weakness and numbness.  Psychiatric/Behavioral: Negative.     Allergies as of 06/03/2018   No Known Allergies     Medication List        Accurate as of 06/03/18  4:43 PM. Always use your most recent med list.          albuterol 108 (90 Base) MCG/ACT inhaler Commonly known as:  PROVENTIL HFA;VENTOLIN HFA Inhale 2 puffs into the lungs every 6 (six) hours as needed for wheezing or shortness of breath.   amoxicillin-clavulanate 875-125 MG tablet Commonly known as:  AUGMENTIN Take 1 tablet by mouth 2 (two) times daily.   anastrozole 1 MG tablet Commonly known as:  ARIMIDEX Take 1 mg by mouth daily.   cefdinir 300 MG capsule Commonly known as:  OMNICEF Take 1 capsule (300 mg total) by mouth 2 (two) times daily. 1 po BID   doxycycline 100 MG tablet Commonly known as:  VIBRA-TABS Take 1 tablet (100 mg total) by mouth 2 (two) times daily. 1 po bid   fluticasone furoate-vilanterol 100-25 MCG/INH Aepb Commonly known as:  BREO ELLIPTA TAKE 1 PUFF BY MOUTH EVERY DAY (Needs to be seen)   ibuprofen 800 MG tablet Commonly known as:  ADVIL,MOTRIN Take 1 tablet (800 mg total) by mouth 3 (three) times daily.   meclizine 25 MG tablet Commonly known as:  ANTIVERT Take 1 tablet (25 mg total) by mouth 3 (three) times daily as needed for dizziness.   meloxicam 15 MG tablet Commonly known as:  MOBIC TAKE 1 TABLET BY MOUTH EVERY DAY WITH FOOD X  2 WEEKS, THEN AS NEEDED   methocarbamol 500 MG tablet Commonly known as:  ROBAXIN Take 1 tablet (500 mg total) by mouth at bedtime as needed.   mirtazapine 15 MG tablet Commonly known as:  REMERON TAKE 1 TABLET BY MOUTH EVERYDAY AT BEDTIME   mometasone 50 MCG/ACT nasal spray Commonly known as:  NASONEX PLACE 2 SPRAYS INTO THE NOSE 2 (TWO) TIMES DAILY AS NEEDED.   montelukast 10 MG tablet Commonly known as:  SINGULAIR TAKE 1 TABLET BY MOUTH EVERYDAY AT BEDTIME   ondansetron 4 MG disintegrating tablet Commonly known as:   ZOFRAN-ODT Take 1 tablet (4 mg total) by mouth every 8 (eight) hours as needed for nausea or vomiting.   pantoprazole 40 MG tablet Commonly known as:  PROTONIX TAKE 1 TABLET BY MOUTH EVERY DAY   TESTOSTERONE IM   zolpidem 10 MG tablet Commonly known as:  AMBIEN Take 1 tablet (10 mg total) by mouth at bedtime as needed for sleep.          Objective:    BP 124/83   Pulse 77   Temp 97.8 F (36.6 C) (Oral)   Ht _0  (1.651 m)   Wt 242 lb 12.8 oz (110.1 kg)   BMI 40.40 kg/m   No Known Allergies  Wt Readings from Last 3 Encounters:  06/03/18 242 lb 12.8 oz (110.1 kg)  05/16/18 230 lb (104.3 kg)  03/14/18 220 lb (99.8 kg)    Physical Exam  Constitutional: He appears well-developed and well-nourished.  HENT:  Head: Normocephalic and atraumatic.  Right Ear: Hearing and tympanic membrane normal.  Left Ear: Hearing and tympanic membrane normal.  Nose: Mucosal edema and sinus tenderness present. No nasal deformity. Right sinus exhibits frontal sinus tenderness. Left sinus exhibits frontal sinus tenderness.  Mouth/Throat: Posterior oropharyngeal erythema present.  Eyes: Pupils are equal, round, and reactive to light. Conjunctivae and EOM are normal. Right eye exhibits no discharge. Left eye exhibits no discharge.  Neck: Normal range of motion. Neck supple.  Cardiovascular: Normal rate, regular rhythm and normal heart sounds.  Pulmonary/Chest: Effort normal. No respiratory distress. He has no decreased breath sounds. He has wheezes. He has no rhonchi. He has no rales.  Abdominal: Soft. Bowel sounds are normal.  Musculoskeletal: Normal range of motion.  Skin: Skin is warm and dry.    Results for orders placed or performed in visit on 10/06/16  CBC with Differential  Result Value Ref Range   WBC 11.7 (H) 3.4 - 10.8 x10E3/uL   RBC 4.85 4.14 - 5.80 x10E6/uL   Hemoglobin 14.1 13.0 - 17.7 g/dL   Hematocrit 42.1 37.5 - 51.0 %   MCV 87 79 - 97 fL   MCH 29.1 26.6 - 33.0 pg    MCHC 33.5 31.5 - 35.7 g/dL   RDW 13.0 12.3 - 15.4 %   Platelets 290 150 - 379 x10E3/uL   Neutrophils 67 Not Estab. %   Lymphs 22 Not Estab. %   Monocytes 7 Not Estab. %   Eos 2 Not Estab. %   Basos 1 Not Estab. %   Neutrophils Absolute 7.9 (H) 1.4 - 7.0 x10E3/uL   Lymphocytes Absolute 2.5 0.7 - 3.1 x10E3/uL   Monocytes Absolute 0.9 0.1 - 0.9 x10E3/uL   EOS (ABSOLUTE) 0.2 0.0 - 0.4 x10E3/uL   Basophils Absolute 0.1 0.0 - 0.2 x10E3/uL   Immature Granulocytes 1 Not Estab. %   Immature Grans (Abs) 0.1 0.0 - 0.1 x10E3/uL  CMP14+EGFR  Result Value Ref  Range   Glucose 87 65 - 99 mg/dL   BUN 12 6 - 20 mg/dL   Creatinine, Ser 0.99 0.76 - 1.27 mg/dL   GFR calc non Af Amer 97 >59 mL/min/1.73   GFR calc Af Amer 112 >59 mL/min/1.73   BUN/Creatinine Ratio 12 9 - 20   Sodium 142 134 - 144 mmol/L   Potassium 4.2 3.5 - 5.2 mmol/L   Chloride 104 96 - 106 mmol/L   CO2 22 18 - 29 mmol/L   Calcium 9.2 8.7 - 10.2 mg/dL   Total Protein 6.5 6.0 - 8.5 g/dL   Albumin 4.3 3.5 - 5.5 g/dL   Globulin, Total 2.2 1.5 - 4.5 g/dL   Albumin/Globulin Ratio 2.0 1.2 - 2.2   Bilirubin Total 0.3 0.0 - 1.2 mg/dL   Alkaline Phosphatase 59 39 - 117 IU/L   AST 17 0 - 40 IU/L   ALT 21 0 - 44 IU/L      Assessment & Plan:   1. Acute bronchitis, unspecified organism - methylPREDNISolone acetate (DEPO-MEDROL) injection 80 mg - doxycycline (VIBRA-TABS) 100 MG tablet; Take 1 tablet (100 mg total) by mouth 2 (two) times daily. 1 po bid  Dispense: 20 tablet; Refill: 0 - albuterol (PROVENTIL HFA;VENTOLIN HFA) 108 (90 Base) MCG/ACT inhaler; Inhale 2 puffs into the lungs every 6 (six) hours as needed for wheezing or shortness of breath.  Dispense: 1 Inhaler; Refill: 0   Continue all other maintenance medications as listed above.  Follow up plan: No follow-ups on file.  Educational handout given for [place] PA-C [facility] [address]  [place], [place]  [address] [phone]   06/03/2018, 4:43 PM

## 2018-06-10 ENCOUNTER — Other Ambulatory Visit: Payer: Self-pay | Admitting: *Deleted

## 2018-06-10 MED ORDER — MIRTAZAPINE 15 MG PO TABS: ORAL_TABLET | ORAL | 0 refills | Status: DC

## 2018-06-10 NOTE — Telephone Encounter (Signed)
Appt 06/14/18

## 2018-06-13 ENCOUNTER — Ambulatory Visit (INDEPENDENT_AMBULATORY_CARE_PROVIDER_SITE_OTHER): Payer: BLUE CROSS/BLUE SHIELD | Admitting: Orthopaedic Surgery

## 2018-06-13 ENCOUNTER — Encounter (INDEPENDENT_AMBULATORY_CARE_PROVIDER_SITE_OTHER): Payer: Self-pay | Admitting: Orthopaedic Surgery

## 2018-06-13 VITALS — BP 121/72 | HR 81 | Ht 65.0 in | Wt 235.0 lb

## 2018-06-13 DIAGNOSIS — Z9889 Other specified postprocedural states: Secondary | ICD-10-CM | POA: Diagnosis not present

## 2018-06-13 NOTE — Progress Notes (Signed)
Office Visit Note   Patient: Chase Stout           Date of Birth: 07-20-1981           MRN: [id number] Visit Date: 06/13/2018              Requested by: Dettinger, Elige Radon, MD [address] [place], [place] [address] PCP: Dettinger, Elige Radon, MD   Assessment & Plan: Visit Diagnoses:  1. S/P ACL reconstruction     Plan: Continue quad work.  Recheck 2 months.  Slip given for participating in firearms training October 22.  Continue desk work x2 months recheck 2 months.  Follow-Up Instructions: Return in about 2 months (around 08/13/2018).   Orders:  No orders of the defined types were placed in this encounter.  No orders of the defined types were placed in this encounter.     Procedures: No procedures performed   Clinical Data: No additional findings.   Subjective: Chief Complaint  Patient presents with  . Left Knee - Follow-up    02/04/18 Left Knee ACL reconstruction    HPI follow-up ACL reconstruction 6/31/19.  He occasionally has some swelling he is doing the bike using the elliptical and walking he is jogged a little bit without problems.  Still on desk work.  Review of Systems 14 point systems updated unchanged from surgery date 4 months ago.  Objective: Vital Signs: BP 121/72   Pulse 81   Ht 5\' 5"  (1.651 m)   Wt 235 lb (106.6 kg)   BMI 39.11 kg/m   Physical Exam  Constitutional: He is oriented to person, place, and time. He appears well-developed and well-nourished.  HENT:  Head: Normocephalic and atraumatic.  Eyes: Pupils are equal, round, and reactive to light. EOM are normal.  Neck: No tracheal deviation present. No thyromegaly present.  Cardiovascular: Normal rate.  Pulmonary/Chest: Effort normal. He has no wheezes.  Abdominal: Soft. Bowel sounds are normal.  Neurological: He is alert and oriented to person, place, and time.  Skin: Skin is warm and dry. Capillary refill takes less than 2 seconds.  Psychiatric: He has a normal mood and affect.  His behavior is normal. Judgment and thought content normal.    Ortho Exam's effusion negative Lockman negative pivot shift trace anterior drawer symmetrical.  Collateral ligaments are stable.  Well-healed knee incision anteriorly.  Extension flexion past 130 degrees.  Specialty Comments:  No specialty comments available.  Imaging: No results found.   PMFS History: Patient Active Problem List   Diagnosis Date Noted  . S/P ACL reconstruction 02/12/2018  . GERD (gastroesophageal reflux disease) 06/14/2015  . Asthma 04/29/2015  . Obesity 04/29/2015  . Vasomotor rhinitis 09/01/2014  . Insomnia 11/22/2012  . Anxiety 11/22/2012  . Rotator cuff dysfunction 06/18/2012  . Osteoarthritis of left shoulder 06/18/2012  . Shoulder pain, left 06/18/2012   Past Medical History:  Diagnosis Date  . Allergy   . Hydrocele 01/31/1999   small hydroceles bilaterally, small calcified loose body in left hemiscrotum  . Kidney stones   . Osteoarthritis of left shoulder 06/18/2012  . Sinus infection     Family History  Problem Relation Age of Onset  . Hypertension Mother   . Cancer Father        lung  . Hypertension Brother     Past Surgical History:  Procedure Laterality Date  . EYE SURGERY Bilateral    Lasix  . KNEE ARTHROSCOPY Left 02/04/2018   Left ACL  .  LITHOTRIPSY    . NASAL SINUS SURGERY    . SHOULDER HEMI-ARTHROPLASTY  09/24/2012   Procedure: SHOULDER HEMI-ARTHROPLASTY;  Surgeon: Eulas Post, MD;  Location: [facility] OR;  Service: Orthopedics;  Laterality: Left;   Social History   Occupational History    Employer: [employer]  Tobacco Use  . Smoking status: Never Smoker  . Smokeless tobacco: Never Used  Substance and Sexual Activity  . Alcohol use: Yes    Alcohol/week: 2.0 standard drinks    Types: 2 Cans of beer per week    Comment: occasionally  . Drug use: No  . Sexual activity: Not on file

## 2018-06-14 ENCOUNTER — Ambulatory Visit: Payer: BLUE CROSS/BLUE SHIELD | Admitting: Family Medicine

## 2018-06-14 ENCOUNTER — Encounter: Payer: Self-pay | Admitting: Family Medicine

## 2018-06-14 VITALS — BP 113/76 | HR 86 | Temp 97.7°F | Ht 65.0 in | Wt 238.4 lb

## 2018-06-14 DIAGNOSIS — J4540 Moderate persistent asthma, uncomplicated: Secondary | ICD-10-CM | POA: Diagnosis not present

## 2018-06-14 DIAGNOSIS — G4700 Insomnia, unspecified: Secondary | ICD-10-CM

## 2018-06-14 DIAGNOSIS — K219 Gastro-esophageal reflux disease without esophagitis: Secondary | ICD-10-CM | POA: Diagnosis not present

## 2018-06-14 LAB — CMP14+EGFR
A/G RATIO: 2 (ref 1.2–2.2)
ALT: 26 IU/L (ref 0–44)
AST: 22 IU/L (ref 0–40)
Albumin: 4.6 g/dL (ref 3.5–5.5)
Alkaline Phosphatase: 57 IU/L (ref 39–117)
BUN/Creatinine Ratio: 11 (ref 9–20)
BUN: 12 mg/dL (ref 6–20)
Bilirubin Total: 0.7 mg/dL (ref 0.0–1.2)
CALCIUM: 9.7 mg/dL (ref 8.7–10.2)
CHLORIDE: 104 mmol/L (ref 96–106)
CO2: 21 mmol/L (ref 20–29)
Creatinine, Ser: 1.13 mg/dL (ref 0.76–1.27)
GFR calc non Af Amer: 81 mL/min/{1.73_m2} (ref >59)
GFR, EST AFRICAN AMERICAN: 94 mL/min/{1.73_m2} (ref >59)
Globulin, Total: 2.3 g/dL (ref 1.5–4.5)
Glucose: 72 mg/dL (ref 65–99)
POTASSIUM: 4.7 mmol/L (ref 3.5–5.2)
Sodium: 143 mmol/L (ref 134–144)
TOTAL PROTEIN: 6.9 g/dL (ref 6.0–8.5)

## 2018-06-14 LAB — LIPID PANEL
CHOLESTEROL TOTAL: 224 mg/dL — AB (ref 100–199)
Chol/HDL Ratio: 6.6 ratio — ABNORMAL HIGH (ref 0.0–5.0)
HDL: 34 mg/dL — ABNORMAL LOW (ref >39)
LDL Calculated: 155 mg/dL — ABNORMAL HIGH (ref 0–99)
TRIGLYCERIDES: 175 mg/dL — AB (ref 0–149)
VLDL CHOLESTEROL CAL: 35 mg/dL (ref 5–40)

## 2018-06-14 LAB — CBC WITH DIFFERENTIAL/PLATELET
BASOS: 1 %
Basophils Absolute: 0.1 10*3/uL (ref 0.0–0.2)
EOS (ABSOLUTE): 0.4 10*3/uL (ref 0.0–0.4)
EOS: 4 %
HEMATOCRIT: 49 % (ref 37.5–51.0)
Hemoglobin: 16.4 g/dL (ref 13.0–17.7)
IMMATURE GRANS (ABS): 0.1 10*3/uL (ref 0.0–0.1)
IMMATURE GRANULOCYTES: 1 %
LYMPHS: 21 %
Lymphocytes Absolute: 2.1 10*3/uL (ref 0.7–3.1)
MCH: 29.1 pg (ref 26.6–33.0)
MCHC: 33.5 g/dL (ref 31.5–35.7)
MCV: 87 fL (ref 79–97)
Monocytes Absolute: 0.8 10*3/uL (ref 0.1–0.9)
Monocytes: 8 %
NEUTROS PCT: 65 %
Neutrophils Absolute: 6.3 10*3/uL (ref 1.4–7.0)
PLATELETS: 274 10*3/uL (ref 150–450)
RBC: 5.63 x10E6/uL (ref 4.14–5.80)
RDW: 12 % — ABNORMAL LOW (ref 12.3–15.4)
WBC: 9.8 10*3/uL (ref 3.4–10.8)

## 2018-06-14 MED ORDER — ZOLPIDEM TARTRATE 10 MG PO TABS: 10.0000 mg | ORAL_TABLET | Freq: Every evening | ORAL | 5 refills | Status: DC | PRN

## 2018-06-14 MED ORDER — MIRTAZAPINE 15 MG PO TABS: ORAL_TABLET | ORAL | 3 refills | Status: DC

## 2018-06-14 MED ORDER — PREDNISONE 20 MG PO TABS: ORAL_TABLET | ORAL | 0 refills | Status: DC

## 2018-06-14 NOTE — Progress Notes (Signed)
BP 113/76   Pulse 86   Temp 97.7 F (36.5 C) (Oral)   Ht '5\' 5"'  (1.651 m)   Wt 238 lb 6.4 oz (108.1 kg)   BMI 39.67 kg/m    Subjective:    Patient ID: Chase Stout, male    DOB: 06/04/1981, 37 y.o.   MRN: [id number]  HPI: Chase Stout is a 37 y.o. male presenting on 06/14/2018 for Medical Management of Chronic Issues (refills)   HPI GERD Patient is currently on pantoprazole 40.  She denies any major symptoms or abdominal pain or belching or burping. She denies any blood in her stool or lightheadedness or dizziness.   Insomnia Patient takes Remeron and Ambien for mood and insomnia and says that they are still working very well for him.  He denies any major mood disorders or depression but just has mild anxiety and it keeps him from resting and when he uses these 2 medications and it helps some rest.  He tries not to use the Ambien every night but just uses it some nights.  He denies any suicidal ideations or thoughts of hurting himself.  Asthma recheck Patient is coming in today for asthma recheck.  He is currently on Bri0 and is just finishing a course of doxycycline and then uses albuterol and Singulair to help with his asthma and allergies.  He was feeling bad couple weeks ago when he came in to be seen and he is just finishing the doxycycline from that a couple days ago and is feeling better but is still having a persistent cough and a little bit of wheeze.  He has been using his inhalers and they have been keeping him from getting sick as frequently but he just still feels like he has not quite kicked of this sickness completely.  They did do 1 dose of a steroid shot on the day he was seen but no course of steroids to go home with.  He denies any fevers or chills or shortness of breath today.  Relevant past medical, surgical, family and social history reviewed and updated as indicated. Interim medical history since our last visit reviewed. Allergies and medications reviewed  and updated.  Review of Systems  Constitutional: Negative for chills and fever.  Eyes: Negative for visual disturbance.  Respiratory: Negative for shortness of breath and wheezing.   Cardiovascular: Negative for chest pain and leg swelling.  Musculoskeletal: Negative for back pain and gait problem.  Skin: Negative for rash.  Neurological: Negative for dizziness, weakness and light-headedness.  Psychiatric/Behavioral: Positive for sleep disturbance. Negative for decreased concentration and dysphoric mood. The patient is nervous/anxious.   All other systems reviewed and are negative.   Per HPI unless specifically indicated above   Allergies as of 06/14/2018   No Known Allergies     Medication List        Accurate as of 06/14/18  8:40 AM. Always use your most recent med list.          anastrozole 1 MG tablet Commonly known as:  ARIMIDEX Take 1 mg by mouth daily.   fluticasone furoate-vilanterol 100-25 MCG/INH Aepb Commonly known as:  BREO ELLIPTA TAKE 1 PUFF BY MOUTH EVERY DAY (Needs to be seen)   ibuprofen 800 MG tablet Commonly known as:  ADVIL,MOTRIN Take 1 tablet (800 mg total) by mouth 3 (three) times daily.   meclizine 25 MG tablet Commonly known as:  ANTIVERT Take 1 tablet (25 mg total) by mouth 3 (three)  times daily as needed for dizziness.   meloxicam 15 MG tablet Commonly known as:  MOBIC TAKE 1 TABLET BY MOUTH EVERY DAY WITH FOOD X 2 WEEKS, THEN AS NEEDED   methocarbamol 500 MG tablet Commonly known as:  ROBAXIN Take 1 tablet (500 mg total) by mouth at bedtime as needed.   mirtazapine 15 MG tablet Commonly known as:  REMERON TAKE 1 TABLET BY MOUTH EVERYDAY AT BEDTIME   montelukast 10 MG tablet Commonly known as:  SINGULAIR TAKE 1 TABLET BY MOUTH EVERYDAY AT BEDTIME   pantoprazole 40 MG tablet Commonly known as:  PROTONIX TAKE 1 TABLET BY MOUTH EVERY DAY   predniSONE 20 MG tablet Commonly known as:  DELTASONE 2 po at same time daily for 5  days   TESTOSTERONE IM   zolpidem 10 MG tablet Commonly known as:  AMBIEN Take 1 tablet (10 mg total) by mouth at bedtime as needed for sleep.          Objective:    BP 113/76   Pulse 86   Temp 97.7 F (36.5 C) (Oral)   Ht '5\' 5"'  (1.651 m)   Wt 238 lb 6.4 oz (108.1 kg)   BMI 39.67 kg/m   Wt Readings from Last 3 Encounters:  06/14/18 238 lb 6.4 oz (108.1 kg)  06/13/18 235 lb (106.6 kg)  06/03/18 242 lb 12.8 oz (110.1 kg)    Physical Exam  Constitutional: He is oriented to person, place, and time. He appears well-developed and well-nourished. No distress.  Eyes: Conjunctivae are normal. No scleral icterus.  Neck: Neck supple. No thyromegaly present.  Cardiovascular: Normal rate, regular rhythm, normal heart sounds and intact distal pulses.  No murmur heard. Pulmonary/Chest: Effort normal and breath sounds normal. No stridor. No respiratory distress. He has no wheezes. He has no rales.  Musculoskeletal: Normal range of motion. He exhibits no edema.  Lymphadenopathy:    He has no cervical adenopathy.  Neurological: He is alert and oriented to person, place, and time. Coordination normal.  Skin: Skin is warm and dry. No rash noted. He is not diaphoretic.  Psychiatric: He has a normal mood and affect. His behavior is normal.  Nursing note and vitals reviewed.       Assessment & Plan:   Problem List Items Addressed This Visit      Respiratory   Asthma   Relevant Medications   predniSONE (DELTASONE) 20 MG tablet     Digestive   GERD (gastroesophageal reflux disease) - Primary   Relevant Orders   CBC with Differential/Platelet   CMP14+EGFR     Other   Insomnia   Relevant Orders   CMP14+EGFR   Morbid obesity ([place])   Relevant Orders   CMP14+EGFR   Lipid panel      Discussed diet and exercise lifestyle changes for obesity, patient is Jordan his knee so he is working on that first and then will increase from there.  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 Follow up plan: Return in about 6 months (around 12/14/2018), or if symptoms worsen or fail to improve, for Recheck asthma and GERD.  Counseling provided for all of the vaccine components Orders Placed This Encounter  Procedures  . CBC with Differential/Platelet  . CMP14+EGFR  . Lipid panel    Caryl Pina,  MD [facility] 06/14/2018, 8:40 AM

## 2018-06-18 ENCOUNTER — Other Ambulatory Visit: Payer: Self-pay | Admitting: Family Medicine

## 2018-06-19 ENCOUNTER — Other Ambulatory Visit: Payer: Self-pay | Admitting: Family Medicine

## 2018-06-21 ENCOUNTER — Other Ambulatory Visit: Payer: Self-pay | Admitting: Physician Assistant

## 2018-06-21 DIAGNOSIS — J209 Acute bronchitis, unspecified: Secondary | ICD-10-CM

## 2018-07-08 ENCOUNTER — Other Ambulatory Visit: Payer: Self-pay | Admitting: *Deleted

## 2018-07-08 MED ORDER — PANTOPRAZOLE SODIUM 40 MG PO TBEC: 40.0000 mg | DELAYED_RELEASE_TABLET | Freq: Every day | ORAL | 1 refills | Status: DC

## 2018-08-08 ENCOUNTER — Ambulatory Visit (INDEPENDENT_AMBULATORY_CARE_PROVIDER_SITE_OTHER): Payer: BLUE CROSS/BLUE SHIELD | Admitting: Orthopaedic Surgery

## 2018-08-08 ENCOUNTER — Encounter (INDEPENDENT_AMBULATORY_CARE_PROVIDER_SITE_OTHER): Payer: Self-pay | Admitting: Orthopaedic Surgery

## 2018-08-08 VITALS — BP 128/82 | HR 79 | Ht 65.0 in | Wt 235.0 lb

## 2018-08-08 DIAGNOSIS — Z9889 Other specified postprocedural states: Secondary | ICD-10-CM | POA: Diagnosis not present

## 2018-08-08 NOTE — Progress Notes (Signed)
Office Visit Note   Patient: Chase Stout           Date of Birth: 10/06/1980           MRN: [id number] Visit Date: 08/08/2018              Requested by: Dettinger, Elige RadonJoshua A, MD [address] [place], [place] [id number] PCP: Dettinger, Elige RadonJoshua A, MD   Assessment & Plan: Visit Diagnoses:  1. S/P ACL reconstruction     Plan: He will continue desk work until 08/26/2018 and then can begin regular please work.  Follow-Up Instructions: No follow-ups on file.   Orders:  No orders of the defined types were placed in this encounter.  No orders of the defined types were placed in this encounter.     Procedures: No procedures performed   Clinical Data: No additional findings.   Subjective: Chief Complaint  Patient presents with  . Left Knee - Follow-up    02/04/18 Left Knee ACL reconstruction    HPI 37 year old male post left knee ACL reconstruction 6/31/2019.  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  He has participated in firearms point training October 22.  Review of Systems positive her previous rotator cuff problems, osteoarthritis left shoulder.  Asthma, GERD, vasomotor rhinitis, increased BMI, left ACL reconstruction 6/31/2019.  14 point review of systems otherwise negative as pertains HPI.   Objective: Vital Signs: BP 128/82   Pulse 79   Ht 5\' 5"  (1.651 m)   Wt 235 lb (106.6 kg)   BMI 39.11 kg/m   Physical Exam  Constitutional: He is oriented to person, place, and time. He appears well-developed and well-nourished.  HENT:  Head: Normocephalic and atraumatic.  Eyes: Pupils are equal, round, and reactive to light. EOM are normal.  Neck: No tracheal deviation present. No thyromegaly present.  Cardiovascular: Normal rate.  Pulmonary/Chest: Effort normal. He has no wheezes.  Abdominal: Soft. Bowel sounds are normal.  Neurological: He is alert and oriented to person, place, and time.  Skin: Skin is warm and dry. Capillary refill  takes less than 2 seconds.  Psychiatric: He has a normal mood and affect. His behavior is normal. Judgment and thought content normal.    Ortho Exam knee incisions well-healed he has 1+ anterior drawer with good endpoint negative pivot shift.  No knee effusion.  He can hop on either foot.  Distal pulses are normal no calf tenderness.  Normal hip range of motion.  Specialty Comments:  No specialty comments available.  Imaging: No results found.   PMFS History: Patient Active Problem List   Diagnosis Date Noted  . S/P ACL reconstruction 02/12/2018  . GERD (gastroesophageal reflux disease) 06/14/2015  . Asthma 04/29/2015  . Morbid obesity ([facility]) 04/29/2015  . Vasomotor rhinitis 09/01/2014  . Insomnia 11/22/2012  . Anxiety 11/22/2012  . Rotator cuff dysfunction 06/18/2012  . Osteoarthritis of left shoulder 06/18/2012  . Shoulder pain, left 06/18/2012   Past Medical History:  Diagnosis Date  . Allergy   . Hydrocele 01/31/1999   small hydroceles bilaterally, small calcified loose body in left hemiscrotum  . Kidney stones   . Osteoarthritis of left shoulder 06/18/2012  . Sinus infection     Family History  Problem Relation Age of Onset  . Hypertension Mother   . Cancer Father        lung  . Hypertension Brother     Past Surgical History:  Procedure Laterality Date  . EYE  SURGERY Bilateral    Lasix  . KNEE ARTHROSCOPY Left 02/04/2018   Left ACL  . LITHOTRIPSY    . NASAL SINUS SURGERY    . SHOULDER HEMI-ARTHROPLASTY  09/24/2012   Procedure: SHOULDER HEMI-ARTHROPLASTY;  Surgeon: Eulas Post, MD;  Location: [facility];  Service: Orthopedics;  Laterality: Left;   Social History   Occupational History    Employer: [employer]  Tobacco Use  . Smoking status: Never Smoker  . Smokeless tobacco: Never Used  Substance and Sexual Activity  . Alcohol use: Yes    Alcohol/week: 2.0 standard drinks    Types: 2 Cans of beer per week    Comment: occasionally  . Drug use: No    . Sexual activity: Not on file

## 2018-08-12 ENCOUNTER — Ambulatory Visit (INDEPENDENT_AMBULATORY_CARE_PROVIDER_SITE_OTHER): Payer: BLUE CROSS/BLUE SHIELD

## 2018-08-12 DIAGNOSIS — Z23 Encounter for immunization: Secondary | ICD-10-CM | POA: Diagnosis not present

## 2018-08-30 ENCOUNTER — Telehealth: Payer: BLUE CROSS/BLUE SHIELD | Admitting: Family

## 2018-08-30 DIAGNOSIS — J329 Chronic sinusitis, unspecified: Secondary | ICD-10-CM

## 2018-08-30 DIAGNOSIS — B9689 Other specified bacterial agents as the cause of diseases classified elsewhere: Secondary | ICD-10-CM

## 2018-08-30 MED ORDER — AMOXICILLIN-POT CLAVULANATE 875-125 MG PO TABS: 1.0000 | ORAL_TABLET | Freq: Two times a day (BID) | ORAL | 0 refills | Status: AC

## 2018-08-30 NOTE — Progress Notes (Signed)
Thank you for the details you included in the comment boxes. Those details are very helpful in determining the best course of treatment for you and help us to provide the best care. See plan below. By the way, I saw you mentioned prednisone. The evidence is very poor for this for sinus infections. In the past, most of us would give it, but now with more research, we have gotten away from it.   We are sorry that you are not feeling well.  Here is how we plan to help!  Based on what you have shared with me it looks like you have sinusitis.  Sinusitis is inflammation and infection in the sinus cavities of the head.  Based on your presentation I believe you most likely have Acute Bacterial Sinusitis.  This is an infection caused by bacteria and is treated with antibiotics. I have prescribed Augmentin 875mg /125mg  one tablet twice daily with food, for 7 days. You may use an oral decongestant such as Mucinex D or if you have glaucoma or high blood pressure use plain Mucinex. Saline nasal spray help and can safely be used as often as needed for congestion.  If you develop worsening sinus pain, fever or notice severe headache and vision changes, or if symptoms are not better after completion of antibiotic, please schedule an appointment with a health care provider.    Sinus infections are not as easily transmitted as other respiratory infection, however we still recommend that you avoid close contact with loved ones, especially the very young and elderly.  Remember to wash your hands thoroughly throughout the day as this is the number one way to prevent the spread of infection!  Home Care:  Only take medications as instructed by your medical team.  Complete the entire course of an antibiotic.  Do not take these medications with alcohol.  A steam or ultrasonic humidifier can help congestion.  You can place a towel over your head and breathe in the steam from hot water coming from a faucet.  Avoid close  contacts especially the very young and the elderly.  Cover your mouth when you cough or sneeze.  Always remember to wash your hands.  Get Help Right Away If:  You develop worsening fever or sinus pain.  You develop a severe head ache or visual changes.  Your symptoms persist after you have completed your treatment plan.  Make sure you  Understand these instructions.  Will watch your condition.  Will get help right away if you are not doing well or get worse.  Your e-visit answers were reviewed by a board certified advanced clinical practitioner to complete your personal care plan.  Depending on the condition, your plan could have included both over the counter or prescription medications.  If there is a problem please reply  once you have received a response from your provider.  Your safety is important to us.  If you have drug allergies check your prescription carefully.    You can use MyChart to ask questions about today's visit, request a non-urgent call back, or ask for a work or school excuse for 24 hours related to this e-Visit. If it has been greater than 24 hours you will need to follow up with your provider, or enter a new e-Visit to address those concerns.  You will get an e-mail in the next two days asking about your experience.  I hope that your e-visit has been valuable and will speed your recovery. Thank you for  using e-visits.    

## 2018-10-02 ENCOUNTER — Other Ambulatory Visit: Payer: Self-pay | Admitting: Family Medicine

## 2018-11-04 ENCOUNTER — Telehealth: Payer: BLUE CROSS/BLUE SHIELD | Admitting: Family

## 2018-11-04 DIAGNOSIS — B9689 Other specified bacterial agents as the cause of diseases classified elsewhere: Secondary | ICD-10-CM | POA: Diagnosis not present

## 2018-11-04 DIAGNOSIS — J019 Acute sinusitis, unspecified: Secondary | ICD-10-CM

## 2018-11-04 MED ORDER — AMOXICILLIN-POT CLAVULANATE 875-125 MG PO TABS: 1.0000 | ORAL_TABLET | Freq: Two times a day (BID) | ORAL | 0 refills | Status: DC

## 2018-11-04 NOTE — Progress Notes (Signed)

## 2018-11-04 NOTE — Addendum Note (Signed)
Addended by: Worthy Rancher B on: 11/04/2018 10:43 AM   Modules accepted: Orders

## 2018-11-14 ENCOUNTER — Ambulatory Visit (INDEPENDENT_AMBULATORY_CARE_PROVIDER_SITE_OTHER): Payer: BLUE CROSS/BLUE SHIELD | Admitting: *Deleted

## 2018-11-14 ENCOUNTER — Other Ambulatory Visit: Payer: Self-pay

## 2018-11-14 DIAGNOSIS — Z23 Encounter for immunization: Secondary | ICD-10-CM

## 2018-11-14 NOTE — Progress Notes (Signed)
Hepatitis A and Hepatitis B immunization (Twinrix) given and tolerated well.

## 2018-12-25 ENCOUNTER — Other Ambulatory Visit: Payer: Self-pay | Admitting: Family Medicine

## 2018-12-27 ENCOUNTER — Other Ambulatory Visit: Payer: Self-pay | Admitting: Family Medicine

## 2019-01-06 ENCOUNTER — Other Ambulatory Visit: Payer: Self-pay | Admitting: Family Medicine

## 2019-01-09 ENCOUNTER — Other Ambulatory Visit: Payer: Self-pay

## 2019-01-09 ENCOUNTER — Ambulatory Visit (INDEPENDENT_AMBULATORY_CARE_PROVIDER_SITE_OTHER): Payer: BLUE CROSS/BLUE SHIELD | Admitting: Family Medicine

## 2019-01-09 ENCOUNTER — Encounter: Payer: Self-pay | Admitting: Family Medicine

## 2019-01-09 DIAGNOSIS — K219 Gastro-esophageal reflux disease without esophagitis: Secondary | ICD-10-CM

## 2019-01-09 DIAGNOSIS — F419 Anxiety disorder, unspecified: Secondary | ICD-10-CM | POA: Diagnosis not present

## 2019-01-09 DIAGNOSIS — J4540 Moderate persistent asthma, uncomplicated: Secondary | ICD-10-CM | POA: Diagnosis not present

## 2019-01-09 DIAGNOSIS — F5101 Primary insomnia: Secondary | ICD-10-CM

## 2019-01-09 MED ORDER — MIRTAZAPINE 15 MG PO TABS: ORAL_TABLET | ORAL | 3 refills | Status: DC

## 2019-01-09 MED ORDER — FLUTICASONE FUROATE-VILANTEROL 100-25 MCG/INH IN AEPB: 1.0000 | INHALATION_SPRAY | Freq: Every day | RESPIRATORY_TRACT | 3 refills | Status: DC

## 2019-01-09 MED ORDER — ZOLPIDEM TARTRATE 10 MG PO TABS: 10.0000 mg | ORAL_TABLET | Freq: Every evening | ORAL | 5 refills | Status: DC | PRN

## 2019-01-09 MED ORDER — PANTOPRAZOLE SODIUM 40 MG PO TBEC: 40.0000 mg | DELAYED_RELEASE_TABLET | Freq: Every day | ORAL | 3 refills | Status: DC

## 2019-01-09 MED ORDER — MONTELUKAST SODIUM 10 MG PO TABS: 10.0000 mg | ORAL_TABLET | Freq: Every day | ORAL | 3 refills | Status: DC

## 2019-01-09 NOTE — Progress Notes (Signed)
Virtual Visit via telephone Note  I connected with Chase Stout on 01/09/19 at 0820 by telephone and verified that I am speaking with the correct person using two identifiers. Chase Stout is currently located at home and no other people are currently with her during visit. The provider, Elige Radon Dettinger, MD is located in their office at time of visit.  Call ended at [phone]  I discussed the limitations, risks, security and privacy concerns of performing an evaluation and management service by telephone and the availability of in person appointments. I also discussed with the patient that there may be a patient responsible charge related to this service. The patient expressed understanding and agreed to proceed.   History and Present Illness: Insomnia  Patient uses the Ambien and Remeron to help with anxiety and mood and sleep and he feels like he is doing pretty well on that and denies any major issues or suicidal ideations.  He says he mainly uses it when he is in the off shift because he works 2 weeks days and then he worked her weeks of nights and that helps him when he is working nights.  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  He denies any issues with it currently.  GERD Patient is currently on pantaprozole.  She denies any major symptoms or abdominal pain or belching or burping. She denies any blood in her stool or lightheadedness or dizziness.   No diagnosis found.  Outpatient Encounter Medications as of 01/09/2019  Medication Sig  . amoxicillin-clavulanate (AUGMENTIN) 875-125 MG tablet Take 1 tablet by mouth 2 (two) times daily.  Marland Kitchen anastrozole (ARIMIDEX) 1 MG tablet Take 1 mg by mouth  daily.  Marland Kitchen BREO ELLIPTA 100-25 MCG/INH AEPB USE 1 PUFF BY MOUTH EVERY DAY (NEEDS TO BE SEEN)  . ibuprofen (ADVIL,MOTRIN) 800 MG tablet Take 1 tablet (800 mg total) by mouth 3 (three) times daily.  . meclizine (ANTIVERT) 25 MG tablet Take 1 tablet (25 mg total) by mouth 3 (three) times daily as needed for dizziness. (Patient not taking: Reported on 06/14/2018)  . meloxicam (MOBIC) 15 MG tablet TAKE 1 TABLET BY MOUTH EVERY DAY WITH FOOD X 2 WEEKS, THEN AS NEEDED  . methocarbamol (ROBAXIN) 500 MG tablet Take 1 tablet (500 mg total) by mouth at bedtime as needed. (Patient not taking: Reported on 08/08/2018)  . mirtazapine (REMERON) 15 MG tablet TAKE 1 TABLET BY MOUTH EVERYDAY AT BEDTIME (Needs to be seen before next refill)  . montelukast (SINGULAIR) 10 MG tablet Take 1 tablet (10 mg total) by mouth at bedtime. (Needs to be seen before next refill)  . pantoprazole (PROTONIX) 40 MG tablet Take 1 tablet (40 mg total) by mouth daily. (Needs to be seen before next refill)  . predniSONE (DELTASONE) 20 MG tablet 2 po at same time daily for 5 days  . PROAIR HFA 108 (90 Base) MCG/ACT inhaler TAKE 2 PUFFS BY MOUTH EVERY 6 HOURS AS NEEDED FOR WHEEZE OR SHORTNESS OF BREATH (Patient not taking: Reported on 08/08/2018)  . TESTOSTERONE IM   . zolpidem (AMBIEN) 10 MG tablet Take 1 tablet (10 mg total) by mouth  at bedtime as needed for sleep.   No facility-administered encounter medications on file as of 01/09/2019.     Review of Systems  Constitutional: Negative for chills and fever.  Eyes: Negative for visual disturbance.  Respiratory: Negative for shortness of breath and wheezing.   Cardiovascular: Negative for chest pain and leg swelling.  Musculoskeletal: Negative for back pain and gait problem.  Skin: Negative for rash.  Neurological: Negative for dizziness, weakness and light-headedness.  Psychiatric/Behavioral: Negative for decreased concentration, dysphoric mood, self-injury, sleep disturbance and suicidal  ideas.  All other systems reviewed and are negative.   Observations/Objective: Patient sounds comfortable and in no acute distress  Assessment and Plan: Problem List Items Addressed This Visit      Respiratory   Asthma   Relevant Medications   fluticasone furoate-vilanterol (BREO ELLIPTA) 100-25 MCG/INH AEPB   montelukast (SINGULAIR) 10 MG tablet     Digestive   GERD (gastroesophageal reflux disease)   Relevant Medications   pantoprazole (PROTONIX) 40 MG tablet     Other   Insomnia - Primary   Anxiety   Relevant Medications   mirtazapine (REMERON) 15 MG tablet       Follow Up Instructions: Follow up in 6 months  Continue current medications, patient seems like he is doing very well on them.   I discussed the assessment and treatment plan with the patient. The patient was provided an opportunity to ask questions and all were answered. The patient agreed with the plan and demonstrated an understanding of the instructions.   The patient was advised to call back or seek an in-person evaluation if the symptoms worsen or if the condition fails to improve as anticipated.  The above assessment and management plan was discussed with the patient. The patient verbalized understanding of and has agreed to the management plan. Patient is aware to call the clinic if symptoms persist or worsen. Patient is aware when to return to the clinic for a follow-up visit. Patient educated on when it is appropriate to go to the emergency department.    I provided 16 minutes of non-face-to-face time during this encounter.    Nils PyleJoshua A Dettinger, MD

## 2019-01-11 NOTE — Progress Notes (Signed)
Greater than 5 minutes, yet less than 10 minutes of time have been spent researching, coordinating, and implementing care for this patient today.  Thank you for the details you included in the comment boxes. Those details are very helpful in determining the best course of treatment for you and help us to provide the best care.  

## 2019-05-22 DIAGNOSIS — Z1159 Encounter for screening for other viral diseases: Secondary | ICD-10-CM | POA: Diagnosis not present

## 2019-05-29 ENCOUNTER — Other Ambulatory Visit: Payer: Self-pay | Admitting: Family Medicine

## 2019-08-05 ENCOUNTER — Other Ambulatory Visit: Payer: Self-pay | Admitting: Family Medicine

## 2019-08-06 NOTE — Telephone Encounter (Signed)
Patient needs an appointment because this is controlled

## 2019-08-19 ENCOUNTER — Other Ambulatory Visit: Payer: Self-pay

## 2019-08-19 ENCOUNTER — Ambulatory Visit (INDEPENDENT_AMBULATORY_CARE_PROVIDER_SITE_OTHER): Payer: BC Managed Care – PPO | Admitting: Family

## 2019-08-19 ENCOUNTER — Encounter: Payer: Self-pay | Admitting: Family

## 2019-08-19 DIAGNOSIS — F5101 Primary insomnia: Secondary | ICD-10-CM

## 2019-08-19 MED ORDER — ZOLPIDEM TARTRATE 5 MG PO TABS: 5.0000 mg | ORAL_TABLET | Freq: Every evening | ORAL | 5 refills | Status: DC | PRN

## 2019-08-19 NOTE — Progress Notes (Signed)
   Virtual Visit via telephone Note Due to COVID-19 pandemic this visit was conducted virtually. This visit type was conducted due to national recommendations for restrictions regarding the COVID-19 Pandemic (e.g. social distancing, sheltering in place) in an effort to limit this patient's exposure and mitigate transmission in our community. All issues noted in this document were discussed and addressed.  A physical exam was not performed with this format.  I connected with Chase Stout on 08/19/19 at 10:55 AM by telephone and verified that I am speaking with the correct person using two identifiers. Chase Stout is currently located at home and no one is currently with him  during visit. The provider, Evelina Dun, FNP is located in their office at time of visit.  I discussed the limitations, risks, security and privacy concerns of performing an evaluation and management service by telephone and the availability of in person appointments. I also discussed with the patient that there may be a patient responsible charge related to this service. The patient expressed understanding and agreed to proceed.   History and Present Illness:  Pt calls the office today for refill of Ambien. He is currently getting the 10 mg, but breaking these up to 5 mg. He states the rx is expired at this pharmacy. He works swing shift with 12 hour shifts and makes his insomnia worse. He does not take the Ambien every night. Last time fill was 05/30/19. Insomnia Primary symptoms: difficulty falling asleep, frequent awakening.  The current episode started more than one year. The onset quality is gradual. The problem occurs intermittently. Past treatments include meditation. The treatment provided moderate relief.      Review of Systems  Psychiatric/Behavioral: The patient has insomnia.   All other systems reviewed and are negative.    Observations/Objective: No SOB or distress noted   Assessment and  Plan: 1. Primary insomnia Will decrease Ambien to 5 mg per pts request Pt reviewed in [place] controlled database- No red flags. Mediation has not be filled since 05/30/19. Keep follow up with PCP - zolpidem (AMBIEN) 5 MG tablet; Take 1 tablet (5 mg total) by mouth at bedtime as needed for sleep.  Dispense: 30 tablet; Refill: 5      I discussed the assessment and treatment plan with the patient. The patient was provided an opportunity to ask questions and all were answered. The patient agreed with the plan and demonstrated an understanding of the instructions.   The patient was advised to call back or seek an in-person evaluation if the symptoms worsen or if the condition fails to improve as anticipated.  The above assessment and management plan was discussed with the patient. The patient verbalized understanding of and has agreed to the management plan. Patient is aware to call the clinic if symptoms persist or worsen. Patient is aware when to return to the clinic for a follow-up visit. Patient educated on when it is appropriate to go to the emergency department.   Time call ended:  11:10 AM  I provided 15 minutes of non-face-to-face time during this encounter.    Evelina Dun, FNP

## 2019-08-20 DIAGNOSIS — F4320 Adjustment disorder, unspecified: Secondary | ICD-10-CM | POA: Diagnosis not present

## 2019-08-24 ENCOUNTER — Telehealth: Payer: BLUE CROSS/BLUE SHIELD | Admitting: Family

## 2019-08-24 DIAGNOSIS — J029 Acute pharyngitis, unspecified: Secondary | ICD-10-CM

## 2019-08-24 MED ORDER — AMOXICILLIN 500 MG PO CAPS: 500.0000 mg | ORAL_CAPSULE | Freq: Two times a day (BID) | ORAL | 0 refills | Status: DC

## 2019-08-24 NOTE — Progress Notes (Signed)

## 2019-09-03 DIAGNOSIS — F4322 Adjustment disorder with anxiety: Secondary | ICD-10-CM | POA: Diagnosis not present

## 2019-09-05 ENCOUNTER — Telehealth: Payer: BC Managed Care – PPO | Admitting: Emergency Medicine

## 2019-09-05 DIAGNOSIS — J329 Chronic sinusitis, unspecified: Secondary | ICD-10-CM | POA: Diagnosis not present

## 2019-09-05 MED ORDER — AMOXICILLIN-POT CLAVULANATE 875-125 MG PO TABS: 1.0000 | ORAL_TABLET | Freq: Two times a day (BID) | ORAL | 0 refills | Status: DC

## 2019-09-05 NOTE — Progress Notes (Signed)

## 2019-09-11 ENCOUNTER — Ambulatory Visit: Payer: BLUE CROSS/BLUE SHIELD | Admitting: Family

## 2019-09-13 IMAGING — DX DG KNEE COMPLETE 4+V*L*
4 series · 4 of 4 positions shown · non-contrast
Comparison: None.

CLINICAL DATA: Patient injured left knee during basketball. Pain
with weight-bearing.

EXAM:
LEFT KNEE - COMPLETE 4+ VIEW

[knee ap (1 of 3)]
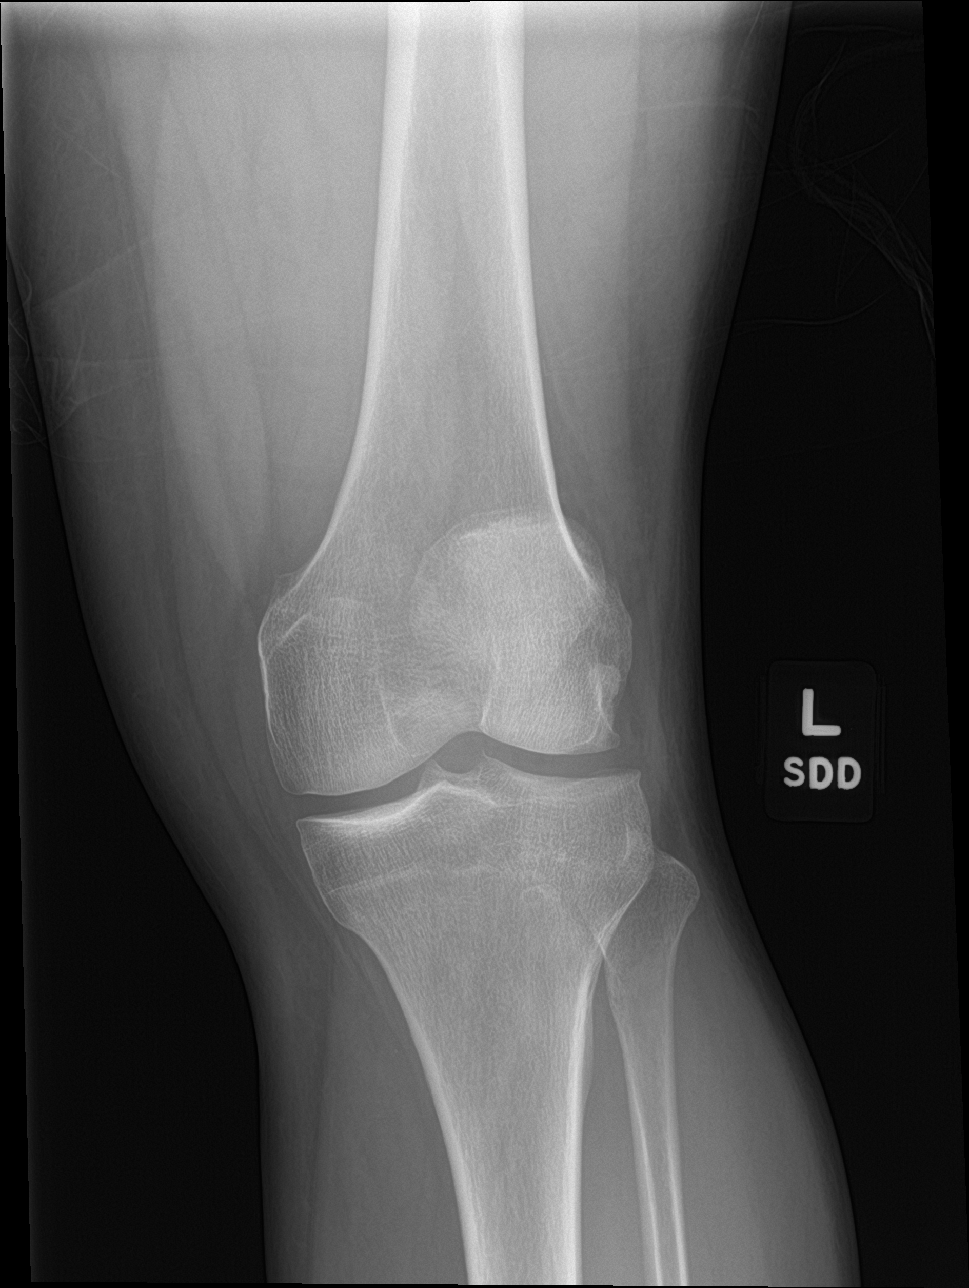

[knee lat]
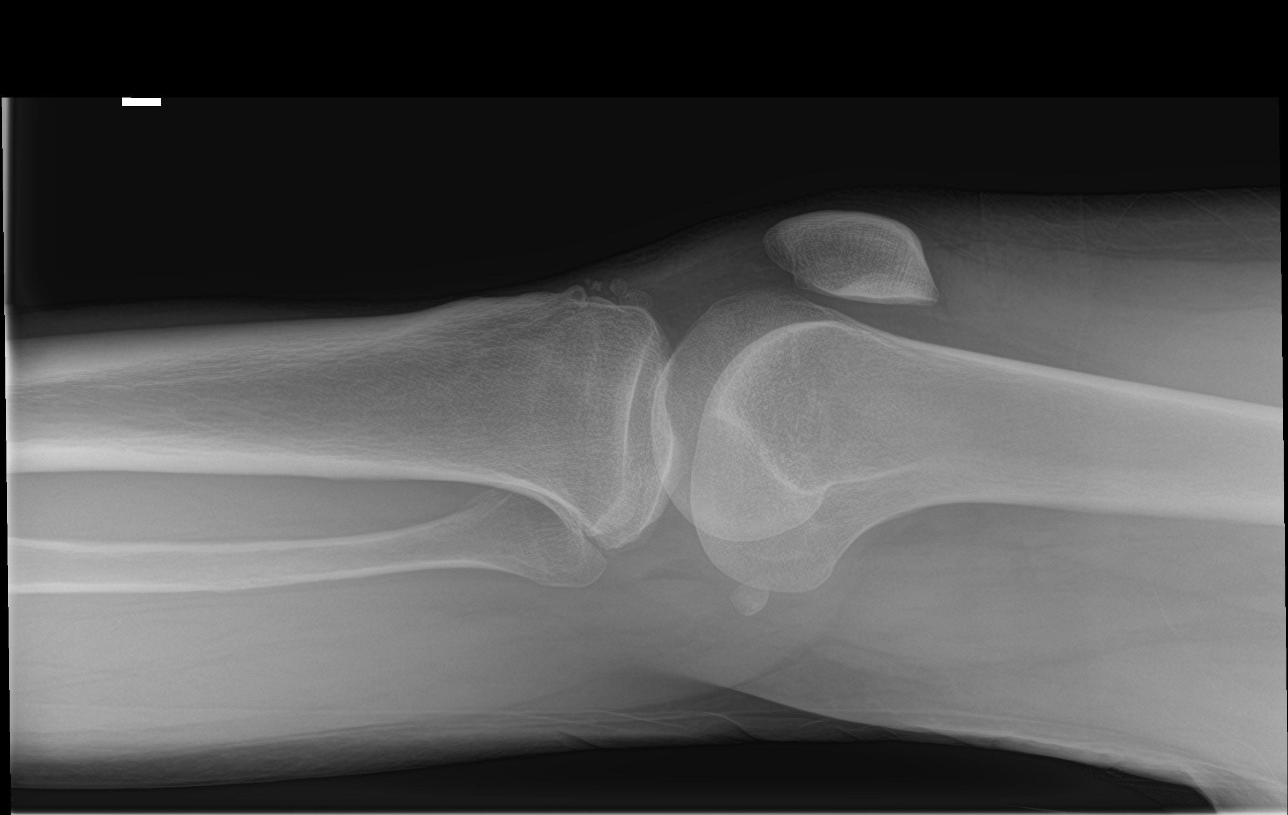

[knee ap (2 of 3)]
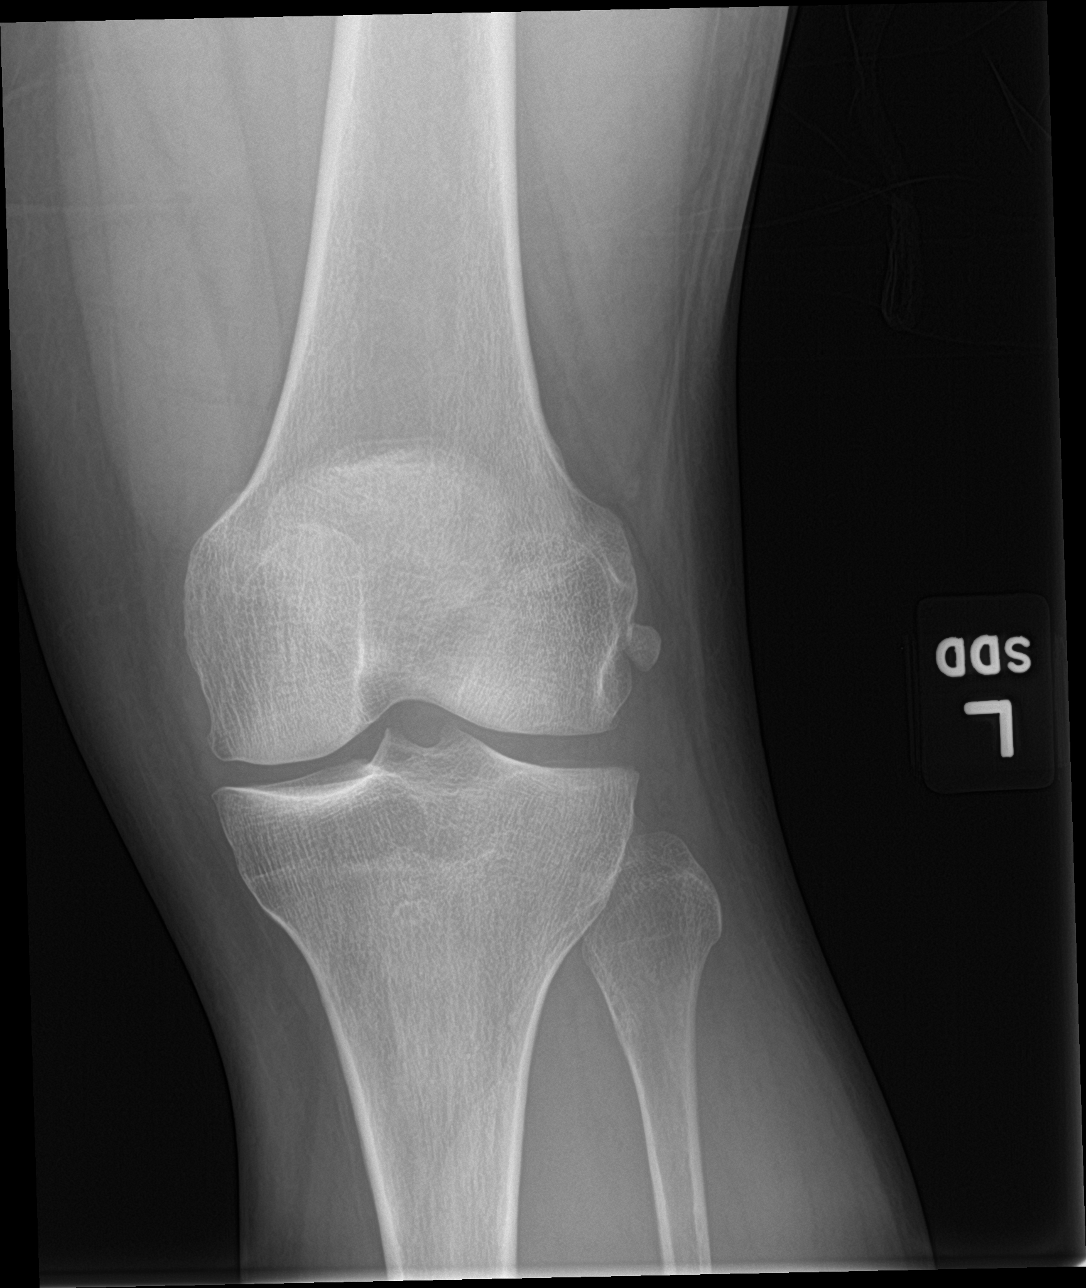

[knee ap (3 of 3)]
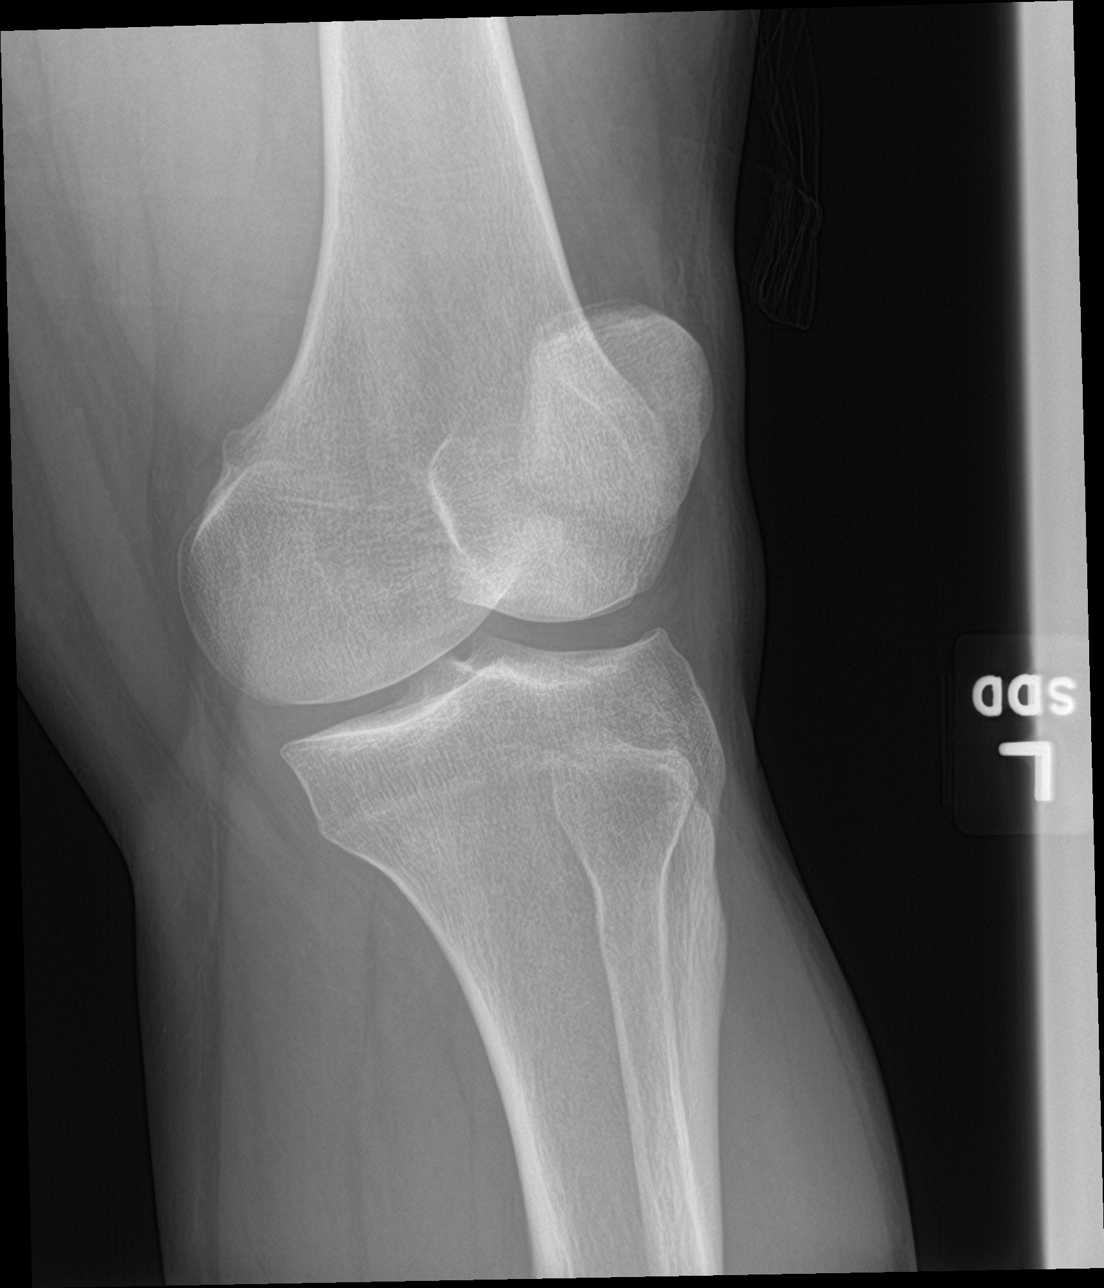

[4 of 4 positions shown; findings below may reference images not displayed]

FINDINGS: Soft tissue swelling over the lateral aspect of the left knee. No
joint effusion. Slight joint space narrowing of the femorotibial
compartment with minimal spurring off the medial tibial plateau. No
acute fracture or joint dislocation. Well corticated rounded
ossifications are seen adjacent to the tibial tuberosity may reflect
stigmata of old Osgood-Schlatter's.
IMPRESSION: Mild soft tissue swelling over the lateral aspect of the left knee.
No acute osseous appearing abnormality. No joint effusion.

## 2019-09-19 IMAGING — MR MR KNEE*L* W/O CM
4 of 6 series · 18 of 40 positions shown · non-contrast
Comparison: Radiographs 12/28/2017.

CLINICAL DATA: Left knee injury playing basketball 1 week ago. Knee
pain with swelling and weakness. Bloody arthrocentesis.

EXAM:
MRI OF THE LEFT KNEE WITHOUT CONTRAST
TECHNIQUE: Multiplanar, multisequence MR imaging of the knee was performed. No
intravenous contrast was administered.

[Series 3: PD fat-sat · axial · 4.0mm · 0.31mm/px · z∈[-48,+63]mm · 8 of 25 slices shown (1 of 4)]
[im 1/25]
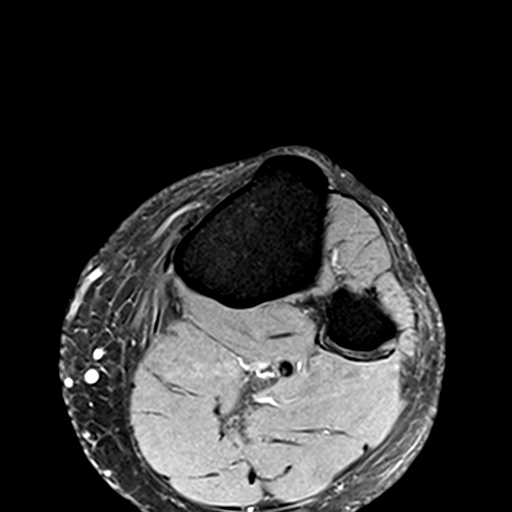
[im 4/25]
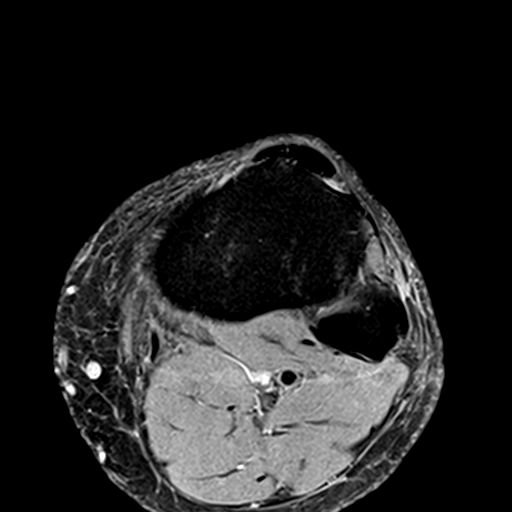
[im 7/25]
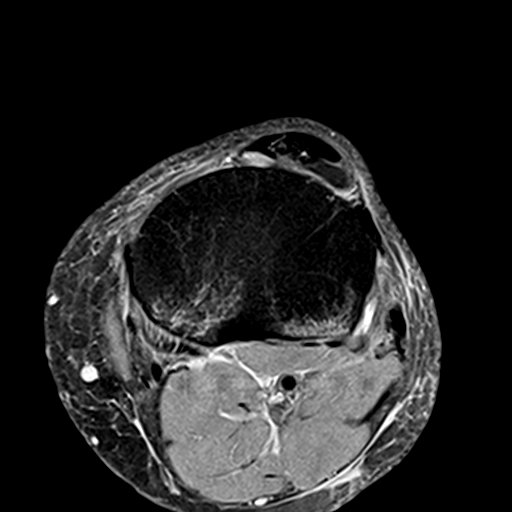
[im 11/25]
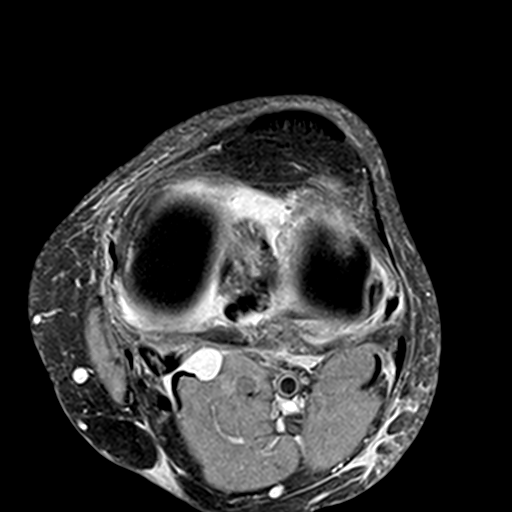
[im 14/25]
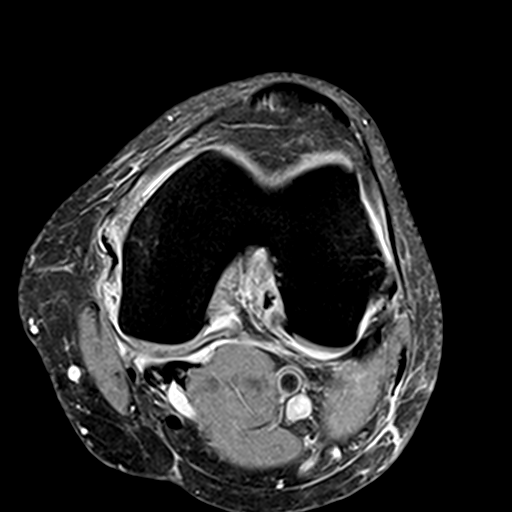
[im 18/25]
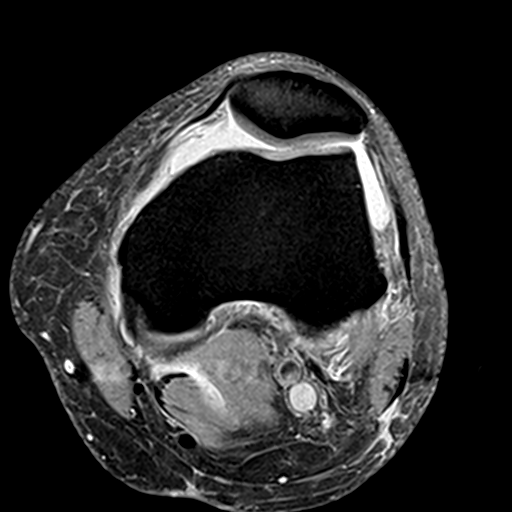
[im 21/25]
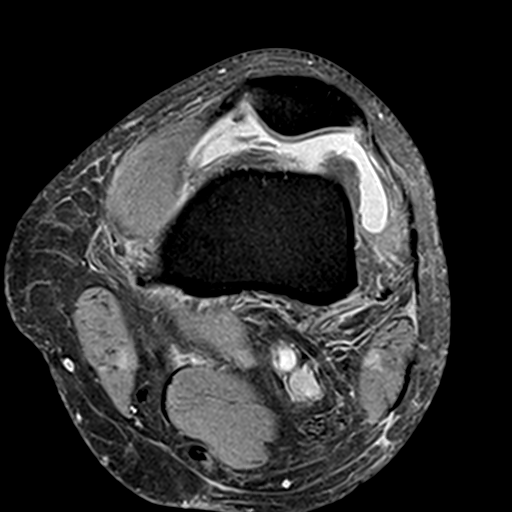
[im 25/25]
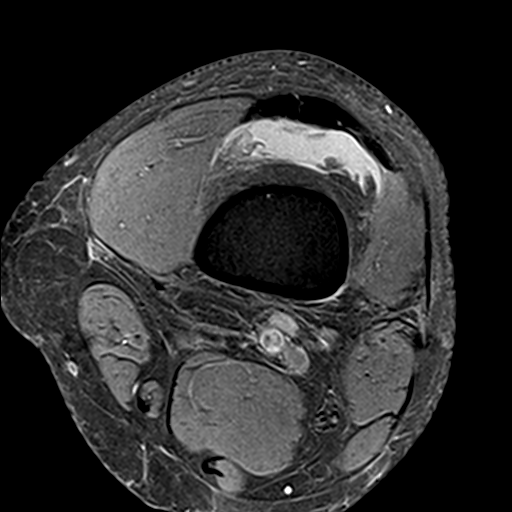

[Series 5: PD fat-sat · sagittal · 4.0mm · 0.31mm/px · 4 of 23 slices shown (2 of 4)]
[im 1/23]
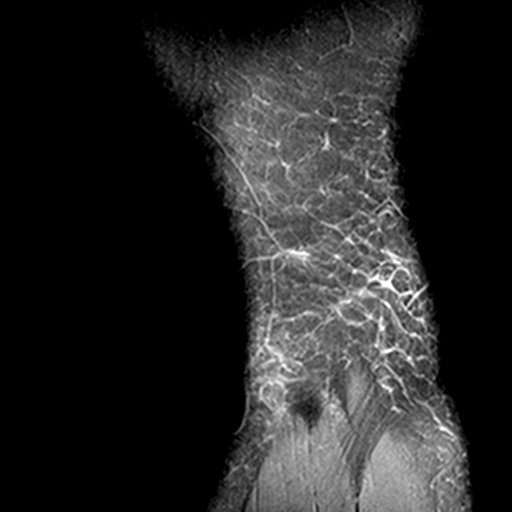
[im 4/23]
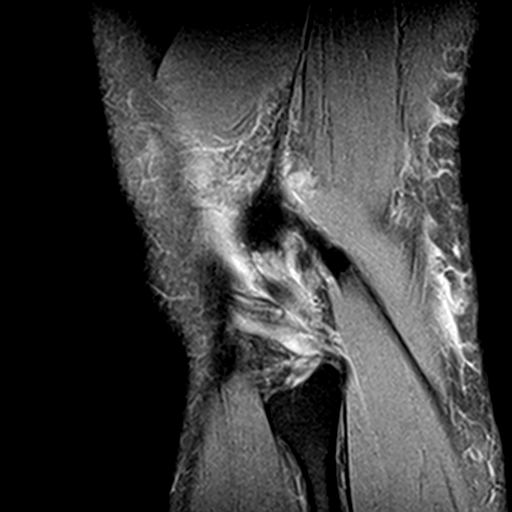
[im 12/23]
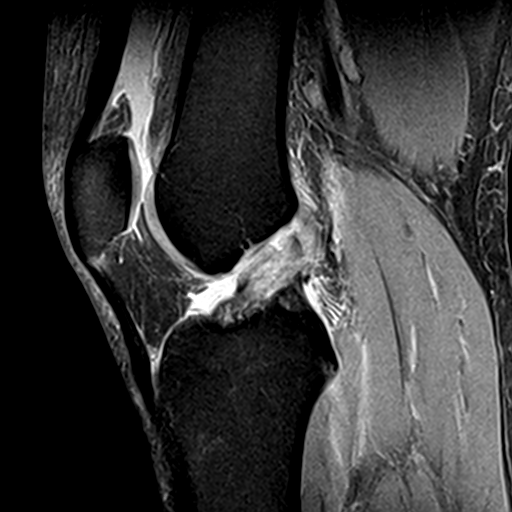
[im 19/23]
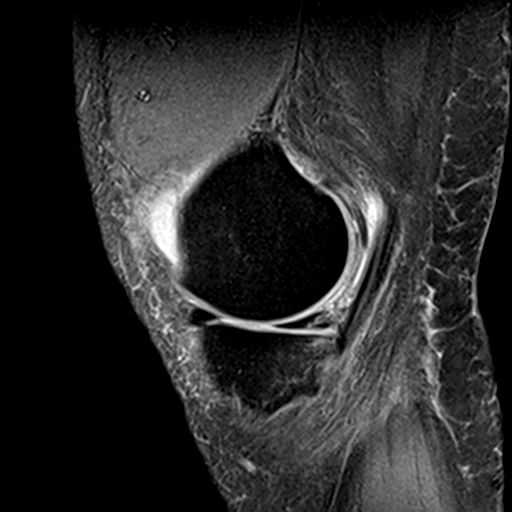

[Series 7: PD fat-sat · coronal · 4.0mm · 0.31mm/px · 3 of 21 slices shown (3 of 4)]
[im 4/21]
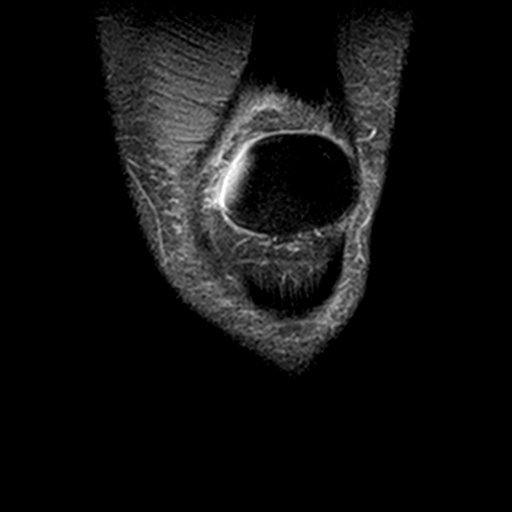
[im 11/21]
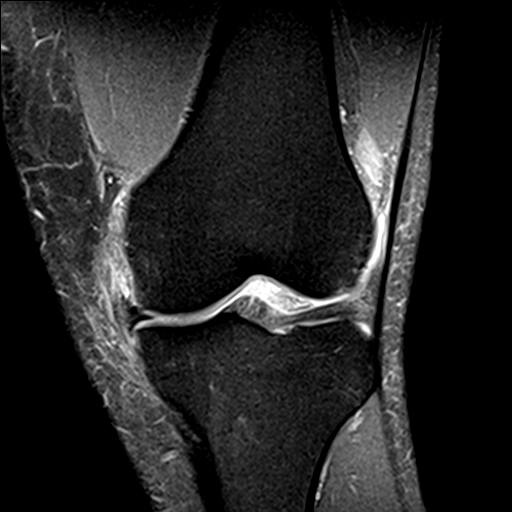
[im 17/21]
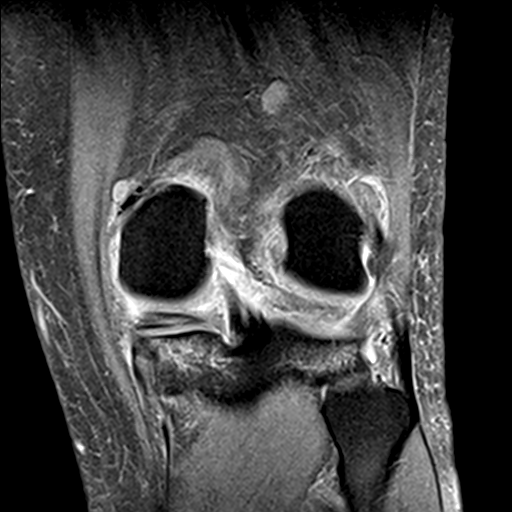

[Series 8: PD fat-sat · oblique · 2.5mm · 0.29mm/px · 3 of 14 slices shown (4 of 4)]
[im 1/14]
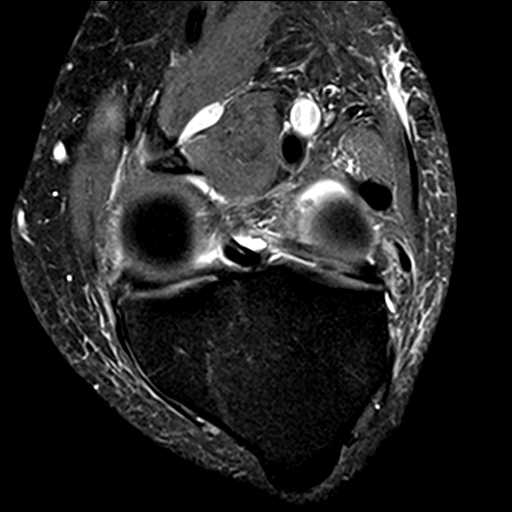
[im 9/14]
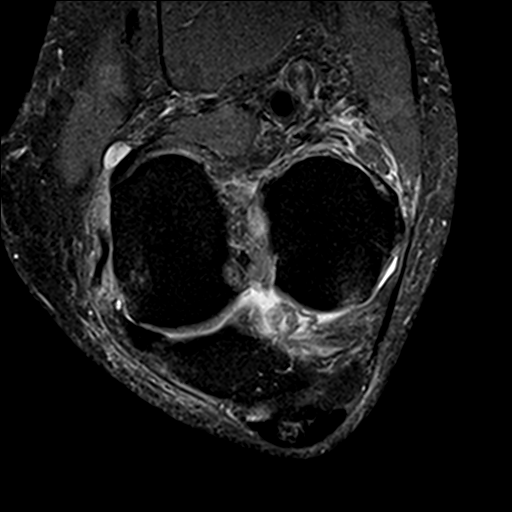
[im 14/14]
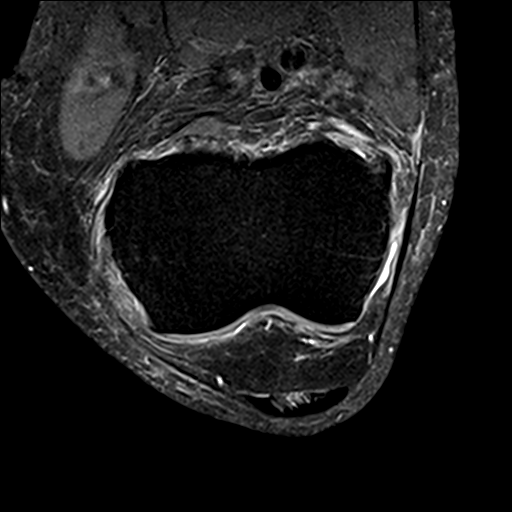

[18 of 40 positions shown; findings below may reference images not displayed]

FINDINGS: MENISCI

Medial meniscus: There is an oblique undersurface tear of the
posterior horn, best seen on sagittal images 17-19. The meniscal
root is intact. No displaced meniscal fragment identified.

Lateral meniscus:  Mild intrasubstance signal without definite tear.

LIGAMENTS

Cruciates: There is an acute complete tear of the anterior cruciate
ligament proximally. There is mild laxity of the ligament with
hemorrhage in the intercondylar notch. The PCL appears normal.

Collaterals: There is moderate edema surrounding the medial
collateral ligament which is intact, consistent with a grade 1 MCL
injury. The lateral collateral ligament complex appears normal.

CARTILAGE

Patellofemoral:  Preserved.

Medial:  Preserved.

Lateral:  Preserved.

MISCELLANEOUS

Joint: Moderate complex joint effusion with possible debris. No
layering intra-articular fat or definite loose bodies.

Popliteal Fossa: Small Baker's cyst. There is soft tissue edema
within the popliteal fossa as well.

Extensor Mechanism: Intact. There are small ossicles adjacent to the
tibial tubercle.

Bones: There are prominent bone contusions of both tibial plateaus
posteriorly. There are minimal bone contusions of both femoral
condyles peripherally. No osteochondral fracture identified.

Other: No other significant periarticular soft tissue findings.
IMPRESSION: 1. Acute complete tear of the anterior cruciate ligament.
2. Associated bone contusions of both tibial plateaus and femoral
condyles consistent with pivot-shift injury.
3. Oblique tear of the posterior horn of the medial meniscus. The
lateral meniscus demonstrates mild intrasubstance signal, but no
discrete tear.
4. Grade 1 MCL sprain.
5. Hemarthrosis and small Baker's cyst.

## 2019-09-30 ENCOUNTER — Ambulatory Visit (INDEPENDENT_AMBULATORY_CARE_PROVIDER_SITE_OTHER): Payer: BC Managed Care – PPO | Admitting: Family

## 2019-09-30 ENCOUNTER — Encounter: Payer: Self-pay | Admitting: Family

## 2019-09-30 DIAGNOSIS — J019 Acute sinusitis, unspecified: Secondary | ICD-10-CM | POA: Diagnosis not present

## 2019-09-30 MED ORDER — DOXYCYCLINE HYCLATE 100 MG PO TABS: 100.0000 mg | ORAL_TABLET | Freq: Two times a day (BID) | ORAL | 0 refills | Status: DC

## 2019-09-30 NOTE — Progress Notes (Signed)
Virtual Visit via telephone Note Due to COVID-19 pandemic this visit was conducted virtually. This visit type was conducted due to national recommendations for restrictions regarding the COVID-19 Pandemic (e.g. social distancing, sheltering in place) in an effort to limit this patient's exposure and mitigate transmission in our community. All issues noted in this document were discussed and addressed.  A physical exam was not performed with this format.  I connected with Chase Stout on 09/30/19 at 8:15 AM by telephone and verified that I am speaking with the correct person using two identifiers. Chase Stout is currently located at work and no one is currently with him  during visit. The provider, Jannifer Rodney, FNP is located in their office at time of visit.  I discussed the limitations, risks, security and privacy concerns of performing an evaluation and management service by telephone and the availability of in person appointments. I also discussed with the patient that there may be a patient responsible charge related to this service. The patient expressed understanding and agreed to proceed.   History and Present Illness:  PT calls the office today with complaints of sinus issues that started over the last 2 months. He has taken amoxicillin and Augmentin with relief while taking, but symptoms return once he stops the medication. He has continued with Singulair and muciex.   Sinusitis This is a recurrent problem. The current episode started more than 1 month ago. The problem has been waxing and waning since onset. There has been no fever. His pain is at a severity of 2/10. The pain is mild. Associated symptoms include congestion, ear pain (left), headaches, a hoarse voice, sinus pressure, sneezing and swollen glands. Pertinent negatives include no coughing, shortness of breath or sore throat. Past treatments include oral decongestants, acetaminophen and antibiotics. The treatment  provided mild relief.     Review of Systems  HENT: Positive for congestion, ear pain (left), hoarse voice, sinus pressure and sneezing. Negative for sore throat.   Respiratory: Negative for cough and shortness of breath.   Neurological: Positive for headaches.  All other systems reviewed and are negative.    Observations/Objective: No SOB or distress noted   Assessment and Plan: 1. Acute sinusitis, recurrence not specified, unspecified location - Take meds as prescribed - Use a cool mist humidifier  -Use saline nose sprays frequently -Force fluids -For any cough or congestion  Use plain Mucinex- regular strength or max strength is fine -For fever or aces or pains- take tylenol or ibuprofen. -Throat lozenges if help -Will treat with doxycycline today, if not better will need referral to ENT.  - doxycycline (VIBRA-TABS) 100 MG tablet; Take 1 tablet (100 mg total) by mouth 2 (two) times daily.  Dispense: 20 tablet; Refill: 0    I discussed the assessment and treatment plan with the patient. The patient was provided an opportunity to ask questions and all were answered. The patient agreed with the plan and demonstrated an understanding of the instructions.   The patient was advised to call back or seek an in-person evaluation if the symptoms worsen or if the condition fails to improve as anticipated.  The above assessment and management plan was discussed with the patient. The patient verbalized understanding of and has agreed to the management plan. Patient is aware to call the clinic if symptoms persist or worsen. Patient is aware when to return to the clinic for a follow-up visit. Patient educated on when it is appropriate to go to the emergency  department.   Time call ended:  8:30 AM  I provided 15 minutes of non-face-to-face time during this encounter.    Chase Dun, FNP

## 2019-10-06 ENCOUNTER — Ambulatory Visit: Payer: BC Managed Care – PPO | Attending: Internal Medicine

## 2019-10-06 ENCOUNTER — Other Ambulatory Visit: Payer: Self-pay

## 2019-10-06 DIAGNOSIS — Z20822 Contact with and (suspected) exposure to covid-19: Secondary | ICD-10-CM

## 2019-10-08 LAB — NOVEL CORONAVIRUS, NAA: SARS-CoV-2, NAA: DETECTED — AB

## 2019-10-09 ENCOUNTER — Other Ambulatory Visit: Payer: Self-pay | Admitting: Nurse Practitioner

## 2019-10-09 ENCOUNTER — Ambulatory Visit (HOSPITAL_COMMUNITY)
Admission: RE | Admit: 2019-10-09 | Discharge: 2019-10-09 | Disposition: A | Discharge status: Home / Self Care | Payer: BC Managed Care – PPO | Source: Ambulatory Visit | Attending: Pulmonary Disease | Admitting: Pulmonary Disease

## 2019-10-09 ENCOUNTER — Encounter (HOSPITAL_COMMUNITY): Payer: Self-pay

## 2019-10-09 DIAGNOSIS — U071 COVID-19: Secondary | ICD-10-CM | POA: Diagnosis not present

## 2019-10-09 DIAGNOSIS — E6609 Other obesity due to excess calories: Secondary | ICD-10-CM | POA: Diagnosis not present

## 2019-10-09 DIAGNOSIS — F4320 Adjustment disorder, unspecified: Secondary | ICD-10-CM | POA: Diagnosis not present

## 2019-10-09 DIAGNOSIS — Z6839 Body mass index (BMI) 39.0-39.9, adult: Secondary | ICD-10-CM | POA: Diagnosis not present

## 2019-10-09 MED ORDER — SODIUM CHLORIDE 0.9 % IV SOLN
700.0000 mg | Freq: Once | INTRAVENOUS | Status: AC
  Administered 2019-10-09: 700 mg via INTRAVENOUS
  Filled 2019-10-09: qty 20

## 2019-10-09 MED ORDER — METHYLPREDNISOLONE SODIUM SUCC 125 MG IJ SOLR: 125.0000 mg | Freq: Once | INTRAMUSCULAR | Status: DC | PRN

## 2019-10-09 MED ORDER — DIPHENHYDRAMINE HCL 50 MG/ML IJ SOLN: 50.0000 mg | Freq: Once | INTRAMUSCULAR | Status: DC | PRN

## 2019-10-09 MED ORDER — EPINEPHRINE 0.3 MG/0.3ML IJ SOAJ: 0.3000 mg | Freq: Once | INTRAMUSCULAR | Status: DC | PRN

## 2019-10-09 MED ORDER — ALBUTEROL SULFATE HFA 108 (90 BASE) MCG/ACT IN AERS: 2.0000 | INHALATION_SPRAY | Freq: Once | RESPIRATORY_TRACT | Status: DC | PRN

## 2019-10-09 MED ORDER — SODIUM CHLORIDE 0.9 % IV SOLN
INTRAVENOUS | Status: DC | PRN
  Administered 2019-10-09: 250 mL via INTRAVENOUS

## 2019-10-09 MED ORDER — FAMOTIDINE IN NACL 20-0.9 MG/50ML-% IV SOLN: 20.0000 mg | Freq: Once | INTRAVENOUS | Status: DC | PRN

## 2019-10-09 NOTE — Progress Notes (Signed)
  Diagnosis: COVID-19  Physician: Dr. Patrick Wright  Procedure: [facility] Med: bamlanivimab infusion - Provided patient with bamlanimivab fact sheet for patients, parents and caregivers prior to infusion.  Complications: No immediate complications noted.  Discharge: Discharged home   Ally Yow 10/09/2019   

## 2019-10-09 NOTE — Progress Notes (Signed)
  I connected by phone with [name] on 10/09/2019 at 11:09 AM to discuss the potential use of an new treatment for mild to moderate COVID-19 viral infection in non-hospitalized patients.  This patient is a 39 y.o. male that meets the FDA criteria for Emergency Use Authorization of bamlanivimab or casirivimab\imdevimab.  Has a (+) direct SARS-CoV-2 viral test result  Has mild or moderate COVID-19   Is ? 41 years of age and weighs ? 40 kg  Is NOT hospitalized due to COVID-19  Is NOT requiring oxygen therapy or requiring an increase in baseline oxygen flow rate due to COVID-19  Is within 10 days of symptom onset  Has at least one of the high risk factor(s) for progression to severe COVID-19 and/or hospitalization as defined in EUA.  Specific high risk criteria : BMI >/= 35   I have spoken and communicated the following to the patient or parent/caregiver:  1. FDA has authorized the emergency use of bamlanivimab and casirivimab\imdevimab for the treatment of mild to moderate COVID-19 in adults and pediatric patients with positive results of direct SARS-CoV-2 viral testing who are 50 years of age and older weighing at least 40 kg, and who are at high risk for progressing to severe COVID-19 and/or hospitalization.  2. The significant known and potential risks and benefits of bamlanivimab and casirivimab\imdevimab, and the extent to which such potential risks and benefits are unknown.  3. Information on available alternative treatments and the risks and benefits of those alternatives, including clinical trials.  4. Patients treated with bamlanivimab and casirivimab\imdevimab should continue to self-isolate and use infection control measures (e.g., wear mask, isolate, social distance, avoid sharing personal items, clean and disinfect "high touch" surfaces, and frequent handwashing) according to CDC guidelines.   5. The patient or parent/caregiver has the option to accept or refuse  bamlanivimab or casirivimab\imdevimab .  After reviewing this information with the patient, The patient agreed to proceed with receiving the bamlanimivab infusion and will be provided a copy of the Fact sheet prior to receiving the infusion.Ivonne Andrew 10/09/2019 11:09 AM

## 2019-10-09 NOTE — Discharge Instructions (Signed)

## 2019-10-12 ENCOUNTER — Other Ambulatory Visit: Payer: Self-pay | Admitting: Family Medicine

## 2020-01-15 ENCOUNTER — Telehealth: Payer: BC Managed Care – PPO | Admitting: Family

## 2020-01-15 DIAGNOSIS — B9689 Other specified bacterial agents as the cause of diseases classified elsewhere: Secondary | ICD-10-CM

## 2020-01-15 DIAGNOSIS — J019 Acute sinusitis, unspecified: Secondary | ICD-10-CM | POA: Diagnosis not present

## 2020-01-15 NOTE — Progress Notes (Signed)
We are sorry that you are not feeling well.  Here is how we plan to help!  Based on what you have shared with me it looks like you have sinusitis.  Sinusitis is inflammation and infection in the sinus cavities of the head.  Based on your presentation I believe you most likely have Acute Bacterial Sinusitis.  This is an infection caused by bacteria and is treated with antibiotics. I have prescribed Doxycycline 100mg by mouth twice a day for 10 days. You may use an oral decongestant such as Mucinex D or if you have glaucoma or high blood pressure use plain Mucinex. Saline nasal spray help and can safely be used as often as needed for congestion.  If you develop worsening sinus pain, fever or notice severe headache and vision changes, or if symptoms are not better after completion of antibiotic, please schedule an appointment with a health care provider.    Sinus infections are not as easily transmitted as other respiratory infection, however we still recommend that you avoid close contact with loved ones, especially the very young and elderly.  Remember to wash your hands thoroughly throughout the day as this is the number one way to prevent the spread of infection!  Home Care:  Only take medications as instructed by your medical team.  Complete the entire course of an antibiotic.  Do not take these medications with alcohol.  A steam or ultrasonic humidifier can help congestion.  You can place a towel over your head and breathe in the steam from hot water coming from a faucet.  Avoid close contacts especially the very young and the elderly.  Cover your mouth when you cough or sneeze.  Always remember to wash your hands.  Get Help Right Away If:  You develop worsening fever or sinus pain.  You develop a severe head ache or visual changes.  Your symptoms persist after you have completed your treatment plan.  Make sure you  Understand these instructions.  Will watch your  condition.  Will get help right away if you are not doing well or get worse.  Your e-visit answers were reviewed by a board certified advanced clinical practitioner to complete your personal care plan.  Depending on the condition, your plan could have included both over the counter or prescription medications.  If there is a problem please reply  once you have received a response from your provider.  Your safety is important to us.  If you have drug allergies check your prescription carefully.    You can use MyChart to ask questions about today's visit, request a non-urgent call back, or ask for a work or school excuse for 24 hours related to this e-Visit. If it has been greater than 24 hours you will need to follow up with your provider, or enter a new e-Visit to address those concerns.  You will get an e-mail in the next two days asking about your experience.  I hope that your e-visit has been valuable and will speed your recovery. Thank you for using e-visits.  Greater than 5 minutes, yet less than 10 minutes of time have been spent researching, coordinating, and implementing care for this patient today.  Thank you for the details you included in the comment boxes. Those details are very helpful in determining the best course of treatment for you and help us to provide the best care.  

## 2020-01-16 MED ORDER — DOXYCYCLINE HYCLATE 100 MG PO TABS: 100.0000 mg | ORAL_TABLET | Freq: Two times a day (BID) | ORAL | 0 refills | Status: DC

## 2020-01-16 NOTE — Addendum Note (Signed)
Addended by: Worthy Rancher B on: 01/16/2020 12:22 PM   Modules accepted: Orders

## 2020-02-05 ENCOUNTER — Other Ambulatory Visit: Payer: Self-pay | Admitting: Family Medicine

## 2020-03-04 ENCOUNTER — Other Ambulatory Visit: Payer: Self-pay | Admitting: Family Medicine

## 2020-03-04 NOTE — Telephone Encounter (Signed)
30 day supply was given 02/05/20 No appt scheduled ntbs for further refills

## 2020-03-15 ENCOUNTER — Encounter: Payer: Self-pay | Admitting: Family Medicine

## 2020-03-15 ENCOUNTER — Other Ambulatory Visit: Payer: Self-pay

## 2020-03-15 ENCOUNTER — Ambulatory Visit: Payer: BC Managed Care – PPO | Admitting: Family Medicine

## 2020-03-15 VITALS — BP 124/78 | HR 83 | Temp 97.5°F | Ht 65.0 in | Wt 207.0 lb

## 2020-03-15 DIAGNOSIS — Z13220 Encounter for screening for lipoid disorders: Secondary | ICD-10-CM

## 2020-03-15 DIAGNOSIS — F419 Anxiety disorder, unspecified: Secondary | ICD-10-CM | POA: Diagnosis not present

## 2020-03-15 DIAGNOSIS — F5101 Primary insomnia: Secondary | ICD-10-CM | POA: Diagnosis not present

## 2020-03-15 DIAGNOSIS — K219 Gastro-esophageal reflux disease without esophagitis: Secondary | ICD-10-CM | POA: Diagnosis not present

## 2020-03-15 MED ORDER — BREO ELLIPTA 100-25 MCG/INH IN AEPB: 1.0000 | INHALATION_SPRAY | Freq: Every day | RESPIRATORY_TRACT | 3 refills | Status: DC

## 2020-03-15 MED ORDER — PANTOPRAZOLE SODIUM 40 MG PO TBEC: 40.0000 mg | DELAYED_RELEASE_TABLET | Freq: Every day | ORAL | 3 refills | Status: DC

## 2020-03-15 MED ORDER — MONTELUKAST SODIUM 10 MG PO TABS: 10.0000 mg | ORAL_TABLET | Freq: Every day | ORAL | 0 refills | Status: DC

## 2020-03-15 MED ORDER — MELOXICAM 15 MG PO TABS: 15.0000 mg | ORAL_TABLET | Freq: Every day | ORAL | 3 refills | Status: DC

## 2020-03-15 MED ORDER — MIRTAZAPINE 15 MG PO TABS: 15.0000 mg | ORAL_TABLET | Freq: Every day | ORAL | 3 refills | Status: DC

## 2020-03-15 MED ORDER — ZOLPIDEM TARTRATE 5 MG PO TABS: 5.0000 mg | ORAL_TABLET | Freq: Every evening | ORAL | 5 refills | Status: DC | PRN

## 2020-03-15 NOTE — Progress Notes (Signed)
BP 124/78   Pulse 83   Temp (!) 97.5 F (36.4 C)   Ht '5\' 5"'  (1.651 m)   Wt 207 lb (93.9 kg)   SpO2 96%   BMI 34.45 kg/m    Subjective:   Patient ID: Chase Stout, male    DOB: 1980-09-11, 39 y.o.   MRN: [id number]  HPI: Chase Stout is a 39 y.o. male presenting on 03/15/2020 for Medical Management of Chronic Issues, Insomnia, Shoulder Pain, and Knee Pain   HPI Anxiety and insomnia Patient has been taking Ambien and Remeron to help with sleep and anxiety and insomnia and he feels like they have been doing very well and has been sleeping at night except for he ran out of the Ambien and that is been a little more of a challenge.  Patient denies any major mood swings or suicidal ideations. Current rx-Ambien 5 mg nightly # meds rx-30 Effectiveness of current meds-works well Adverse reactions form meds-none  Pill count performed-No Last drug screen -N/A ( high risk q78m moderate risk q680mlow risk yearly ) Urine drug screen today- Yes Was the NCEstoeviewed-yes  If yes were their any concerning findings? -None  No flowsheet data found.   Controlled substance contract signed on: Today  GERD Patient is currently on pantoprazole.  She denies any major symptoms or abdominal pain or belching or burping. She denies any blood in her stool or lightheadedness or dizziness.   Patient gets the occasional arthritis in his left shoulder and uses meloxicam as needed.  Patient has chronic allergies and takes Breo and Singulair to help with this and they do pretty good.  Relevant past medical, surgical, family and social history reviewed and updated as indicated. Interim medical history since our last visit reviewed. Allergies and medications reviewed and updated.  Review of Systems  Constitutional: Negative for chills and fever.  Respiratory: Negative for shortness of breath and wheezing.   Cardiovascular: Negative for chest pain and leg swelling.  Musculoskeletal: Negative  for back pain and gait problem.  Skin: Negative for rash.  Psychiatric/Behavioral: Positive for sleep disturbance. Negative for dysphoric mood, self-injury and suicidal ideas. The patient is nervous/anxious.   All other systems reviewed and are negative.   Per HPI unless specifically indicated above   Allergies as of 03/15/2020   No Known Allergies     Medication List       Accurate as of March 15, 2020  2:40 PM. If you have any questions, ask your nurse or doctor.        anastrozole 1 MG tablet Commonly known as: ARIMIDEX Take 1 mg by mouth daily.   Breo Ellipta 100-25 MCG/INH Aepb Generic drug: fluticasone furoate-vilanterol INHALE 1 PUFF BY MOUTH EVERY DAY   doxycycline 100 MG tablet Commonly known as: VIBRA-TABS Take 1 tablet (100 mg total) by mouth 2 (two) times daily.   doxycycline 100 MG tablet Commonly known as: VIBRA-TABS Take 1 tablet (100 mg total) by mouth 2 (two) times daily.   meloxicam 15 MG tablet Commonly known as: MOBIC TAKE 1 TABLET BY MOUTH EVERY DAY WITH FOOD X 2 WEEKS, THEN AS NEEDED   mirtazapine 15 MG tablet Commonly known as: REMERON TAKE 1 TABLET BY MOUTH EVERYDAY AT BEDTIME (NEEDS TO BE SEEN BEFORE NEXT REFILL)   montelukast 10 MG tablet Commonly known as: SINGULAIR TAKE 1 TABLET (10 MG TOTAL) BY MOUTH AT BEDTIME. (NEEDS TO BE SEEN BEFORE NEXT REFILL)   pantoprazole 40 MG tablet  Commonly known as: PROTONIX Take 1 tablet (40 mg total) by mouth daily. (Needs to be seen before next refill)   TESTOSTERONE IM   zolpidem 5 MG tablet Commonly known as: AMBIEN Take 1 tablet (5 mg total) by mouth at bedtime as needed for sleep.        Objective:   BP 124/78   Pulse 83   Temp (!) 97.5 F (36.4 C)   Ht '5\' 5"'  (1.651 m)   Wt 207 lb (93.9 kg)   SpO2 96%   BMI 34.45 kg/m   Wt Readings from Last 3 Encounters:  03/15/20 207 lb (93.9 kg)  08/08/18 235 lb (106.6 kg)  06/14/18 238 lb 6.4 oz (108.1 kg)    Physical Exam Vitals and  nursing note reviewed.  Constitutional:      General: He is not in acute distress.    Appearance: He is well-developed. He is not diaphoretic.  Eyes:     General: No scleral icterus.    Conjunctiva/sclera: Conjunctivae normal.  Neck:     Thyroid: No thyromegaly.  Cardiovascular:     Rate and Rhythm: Normal rate and regular rhythm.     Heart sounds: Normal heart sounds. No murmur heard.   Pulmonary:     Effort: Pulmonary effort is normal. No respiratory distress.     Breath sounds: Normal breath sounds. No wheezing.  Musculoskeletal:        General: Normal range of motion.     Cervical back: Neck supple.  Lymphadenopathy:     Cervical: No cervical adenopathy.  Skin:    General: Skin is warm and dry.     Findings: No rash.  Neurological:     Mental Status: He is alert and oriented to person, place, and time.     Coordination: Coordination normal.  Psychiatric:        Behavior: Behavior normal.       Assessment & Plan:   Problem List Items Addressed This Visit      Digestive   GERD (gastroesophageal reflux disease)   Relevant Medications   pantoprazole (PROTONIX) 40 MG tablet   Other Relevant Orders   CBC with Differential/Platelet   CMP14+EGFR   Lipid panel   TSH     Other   Insomnia - Primary   Relevant Medications   zolpidem (AMBIEN) 5 MG tablet   Other Relevant Orders   CBC with Differential/Platelet   CMP14+EGFR   Lipid panel   TSH   ToxASSURE Select 13 (MW), Urine   Anxiety   Relevant Medications   mirtazapine (REMERON) 15 MG tablet   Other Relevant Orders   CBC with Differential/Platelet   CMP14+EGFR   Lipid panel   TSH   ToxASSURE Select 13 (MW), Urine    Other Visit Diagnoses    Lipid screening       Relevant Orders   Lipid panel      Will continue patient's medication including Ambien and give refills for the other medications, patient will do blood work today. Follow up plan: Return in about 6 months (around 09/15/2020), or if  symptoms worsen or fail to improve, for Insomnia and anxiety recheck.  Counseling provided for all of the vaccine components No orders of the defined types were placed in this encounter.   Caryl Pina, MD [facility] 03/15/2020, 2:40 PM

## 2020-03-16 LAB — CBC WITH DIFFERENTIAL/PLATELET
Basophils Absolute: 0.1 10*3/uL (ref 0.0–0.2)
Basos: 1 %
EOS (ABSOLUTE): 0.5 10*3/uL — ABNORMAL HIGH (ref 0.0–0.4)
Eos: 4 %
Hematocrit: 47.7 % (ref 37.5–51.0)
Hemoglobin: 15.9 g/dL (ref 13.0–17.7)
Immature Grans (Abs): 0.1 10*3/uL (ref 0.0–0.1)
Immature Granulocytes: 1 %
Lymphocytes Absolute: 2 10*3/uL (ref 0.7–3.1)
Lymphs: 17 %
MCH: 29.8 pg (ref 26.6–33.0)
MCHC: 33.3 g/dL (ref 31.5–35.7)
MCV: 90 fL (ref 79–97)
Monocytes Absolute: 0.9 10*3/uL (ref 0.1–0.9)
Monocytes: 8 %
Neutrophils Absolute: 7.8 10*3/uL — ABNORMAL HIGH (ref 1.4–7.0)
Neutrophils: 69 %
Platelets: 277 10*3/uL (ref 150–450)
RBC: 5.33 x10E6/uL (ref 4.14–5.80)
RDW: 11.7 % (ref 11.6–15.4)
WBC: 11.4 10*3/uL — ABNORMAL HIGH (ref 3.4–10.8)

## 2020-03-16 LAB — CMP14+EGFR
ALT: 16 IU/L (ref 0–44)
AST: 20 IU/L (ref 0–40)
Albumin/Globulin Ratio: 1.9 (ref 1.2–2.2)
Albumin: 4.4 g/dL (ref 4.0–5.0)
Alkaline Phosphatase: 95 IU/L (ref 48–121)
BUN/Creatinine Ratio: 9 (ref 9–20)
BUN: 10 mg/dL (ref 6–24)
Bilirubin Total: 0.3 mg/dL (ref 0.0–1.2)
CO2: 22 mmol/L (ref 20–29)
Calcium: 9.6 mg/dL (ref 8.7–10.2)
Chloride: 104 mmol/L (ref 96–106)
Creatinine, Ser: 1.15 mg/dL (ref 0.76–1.27)
GFR calc Af Amer: 92 mL/min/{1.73_m2} (ref >59)
GFR calc non Af Amer: 79 mL/min/{1.73_m2} (ref >59)
Globulin, Total: 2.3 g/dL (ref 1.5–4.5)
Glucose: 91 mg/dL (ref 65–99)
Potassium: 4.3 mmol/L (ref 3.5–5.2)
Sodium: 140 mmol/L (ref 134–144)
Total Protein: 6.7 g/dL (ref 6.0–8.5)

## 2020-03-16 LAB — TSH: TSH: 1.2 u[IU]/mL (ref 0.450–4.500)

## 2020-03-16 LAB — LIPID PANEL
Chol/HDL Ratio: 5 ratio (ref 0.0–5.0)
Cholesterol, Total: 196 mg/dL (ref 100–199)
HDL: 39 mg/dL — ABNORMAL LOW (ref >39)
LDL Chol Calc (NIH): 121 mg/dL — ABNORMAL HIGH (ref 0–99)
Triglycerides: 205 mg/dL — ABNORMAL HIGH (ref 0–149)
VLDL Cholesterol Cal: 36 mg/dL (ref 5–40)

## 2020-03-17 LAB — TOXASSURE SELECT 13 (MW), URINE

## 2020-04-07 ENCOUNTER — Other Ambulatory Visit: Payer: Self-pay | Admitting: Family Medicine

## 2020-04-26 ENCOUNTER — Telehealth: Payer: BC Managed Care – PPO | Admitting: Family

## 2020-04-26 DIAGNOSIS — J019 Acute sinusitis, unspecified: Secondary | ICD-10-CM

## 2020-04-26 DIAGNOSIS — B9689 Other specified bacterial agents as the cause of diseases classified elsewhere: Secondary | ICD-10-CM | POA: Diagnosis not present

## 2020-04-26 MED ORDER — AMOXICILLIN-POT CLAVULANATE 875-125 MG PO TABS: 1.0000 | ORAL_TABLET | Freq: Two times a day (BID) | ORAL | 0 refills | Status: DC

## 2020-04-26 NOTE — Progress Notes (Signed)

## 2020-06-08 ENCOUNTER — Telehealth: Payer: Self-pay

## 2020-06-08 NOTE — Telephone Encounter (Signed)
Appointment scheduled.

## 2020-06-11 ENCOUNTER — Ambulatory Visit: Payer: BC Managed Care – PPO | Admitting: Family Medicine

## 2020-06-11 ENCOUNTER — Encounter: Payer: Self-pay | Admitting: Family Medicine

## 2020-06-11 ENCOUNTER — Other Ambulatory Visit: Payer: Self-pay

## 2020-06-11 VITALS — BP 119/73 | HR 80 | Temp 97.7°F | Ht 65.0 in | Wt 202.0 lb

## 2020-06-11 DIAGNOSIS — G5621 Lesion of ulnar nerve, right upper limb: Secondary | ICD-10-CM | POA: Diagnosis not present

## 2020-06-11 DIAGNOSIS — M7521 Bicipital tendinitis, right shoulder: Secondary | ICD-10-CM | POA: Diagnosis not present

## 2020-06-11 MED ORDER — DEXAMETHASONE 2 MG PO TABS: ORAL_TABLET | ORAL | 0 refills | Status: DC

## 2020-06-11 NOTE — Progress Notes (Signed)
BP 119/73   Pulse 80   Temp 97.7 F (36.5 C)   Ht 5\' 5"  (1.651 m)   Wt 91.6 kg   SpO2 97%   BMI 33.61 kg/m    Subjective:   Patient ID: Chase Stout, male    DOB: 1981-04-01, 39 y.o.   MRN: [id number]  HPI: Chase Stout is a 39 y.o. male presenting on 06/11/2020 for Numbness (right shoulder/arm/ 4th and 5th finger. Noticed after receiving 2nd Covid vaccine on 05/24/2020)   HPI  Patient states that he began noticing right arm tingling and numbness after receiving 2nd dose of Pfizer vaccine on 9/20. He states he initially had injection site pain and mild flu-like symptoms. His systemic symptoms resolved within 24 hours, but patient states he noticed his arm was not improving after 2-3 days. After 4-5 days he began noticing numbness in the right arm and difficulty with movement. He states driving in particular causes him pain in the right shoulder, which he rates a 4/5. He states he has a "tingling" sensation in the arm and hand at rest which occasionally becomes a burning sensation in the forearm. He also endorses numbness in the ulnar nerve distribution which comes and goes. Patient states he feels he has had some weakness in his grip strength as well. He states the tingling sensation awakens him from sleep. He has no identifiable triggers; he has tried meloxicam which has not provided any relief. He denies recent trauma, excessive lifting, history of injury to right arm or shoulder, or history of vaccine reaction. He denies rash, swelling, fever, chills.   Relevant past medical, surgical, family and social history reviewed and updated as indicated. Interim medical history since our last visit reviewed. Allergies and medications reviewed and updated.  Review of Systems  Constitutional: Negative for chills and fever.  Respiratory: Negative for shortness of breath.   Cardiovascular: Negative for chest pain.  Musculoskeletal: Positive for arthralgias and myalgias. Negative for joint  swelling and neck stiffness.  Skin: Negative for color change, pallor, rash and wound.  Neurological: Positive for weakness and numbness. Negative for dizziness and headaches.    Per HPI unless specifically indicated above   Allergies as of 06/11/2020   No Known Allergies     Medication List       Accurate as of June 11, 2020  2:44 PM. If you have any questions, ask your nurse or doctor.        amoxicillin-clavulanate 875-125 MG tablet Commonly known as: AUGMENTIN Take 1 tablet by mouth 2 (two) times daily.   anastrozole 1 MG tablet Commonly known as: ARIMIDEX Take 1 mg by mouth daily.   Breo Ellipta 100-25 MCG/INH Aepb Generic drug: fluticasone furoate-vilanterol Inhale 1 puff into the lungs daily.   meloxicam 15 MG tablet Commonly known as: MOBIC Take 1 tablet (15 mg total) by mouth daily.   mirtazapine 15 MG tablet Commonly known as: REMERON Take 1 tablet (15 mg total) by mouth at bedtime.   montelukast 10 MG tablet Commonly known as: SINGULAIR Take 1 tablet (10 mg total) by mouth at bedtime.   pantoprazole 40 MG tablet Commonly known as: PROTONIX Take 1 tablet (40 mg total) by mouth daily. (Needs to be seen before next refill)   TESTOSTERONE IM   zolpidem 5 MG tablet Commonly known as: AMBIEN Take 1 tablet (5 mg total) by mouth at bedtime as needed for sleep.        Objective:   BP 119/73  Pulse 80   Temp 97.7 F (36.5 C)   Ht 5\' 5"  (1.651 m)   Wt 91.6 kg   SpO2 97%   BMI 33.61 kg/m   Wt Readings from Last 3 Encounters:  06/11/20 91.6 kg  03/15/20 93.9 kg  08/08/18 106.6 kg    Physical Exam Constitutional:      General: He is not in acute distress.    Appearance: Normal appearance.  HENT:     Head: Normocephalic and atraumatic.  Cardiovascular:     Rate and Rhythm: Normal rate and regular rhythm.     Pulses: Normal pulses.     Heart sounds: Normal heart sounds.  Pulmonary:     Effort: Pulmonary effort is normal.     Breath  sounds: Normal breath sounds.  Musculoskeletal:        General: Tenderness present. No swelling or deformity.     Right lower leg: No edema.     Left lower leg: No edema.     Comments: Tenderness to palpation over right bicipital grove and injection site. Sensation, strength intact b/l; pulses 2+. Pain with active and passive ROM including internal and external rotation, supination and pronation.  - Tinel's, Phalen's, Drop arm, empty can test.   Skin:    General: Skin is warm and dry.     Capillary Refill: Capillary refill takes less than 2 seconds.     Findings: No bruising, erythema, lesion or rash.  Neurological:     Mental Status: He is alert.       Assessment & Plan:   Problem List Items Addressed This Visit    None    Visit Diagnoses    Biceps tendinitis of right upper extremity    -  Primary   Ulnar neuropathy of right upper extremity          Patient likely has a tendinitis likely due to inflammation in the region and an ulnar neuropathy, will do conservative management and anti-inflammatories and stretching and icing and will give a short burst of dexamethasone. Follow up plan: Return if symptoms worsen or fail to improve.  Arm pain most likely due to biceps tendonitis and compression neuropathy. Unclear as to whether inflammatory reaction from COVID vaccine may be contributing to neuropathy. Provided patient with course of Decadron to help with inflammation. Advised patient that he can also continue to use NSAIDs. Discussed with patient that if symptoms do not subside with steroids/NSAID's, may need EMG to determine source of nerve  impingement.   Counseling provided for all of the vaccine components No orders of the defined types were placed in this encounter.  Patient seen and examined with 14/05/19, PA student, agree with assessment and plan above.  We will do conservative management for now. Nilda Calamity, MD [facility] 06/11/2020,  2:44 PM

## 2020-06-21 ENCOUNTER — Other Ambulatory Visit: Payer: Self-pay | Admitting: Family Medicine

## 2020-07-10 ENCOUNTER — Other Ambulatory Visit: Payer: Self-pay | Admitting: Family Medicine

## 2020-07-12 NOTE — Telephone Encounter (Signed)
OV 03/15/20 RTC 6 mos

## 2020-07-14 ENCOUNTER — Telehealth: Payer: BC Managed Care – PPO | Admitting: Family

## 2020-07-14 DIAGNOSIS — R059 Cough, unspecified: Secondary | ICD-10-CM

## 2020-07-14 DIAGNOSIS — J4541 Moderate persistent asthma with (acute) exacerbation: Secondary | ICD-10-CM | POA: Diagnosis not present

## 2020-07-14 MED ORDER — BENZONATATE 100 MG PO CAPS: 100.0000 mg | ORAL_CAPSULE | Freq: Three times a day (TID) | ORAL | 0 refills | Status: DC | PRN

## 2020-07-14 MED ORDER — PREDNISONE 10 MG (21) PO TBPK: ORAL_TABLET | ORAL | 0 refills | Status: DC

## 2020-07-14 NOTE — Progress Notes (Signed)

## 2020-10-04 DIAGNOSIS — M542 Cervicalgia: Secondary | ICD-10-CM | POA: Diagnosis not present

## 2020-10-04 DIAGNOSIS — M25511 Pain in right shoulder: Secondary | ICD-10-CM | POA: Diagnosis not present

## 2020-10-09 ENCOUNTER — Other Ambulatory Visit: Payer: Self-pay | Admitting: Family Medicine

## 2020-10-23 ENCOUNTER — Other Ambulatory Visit: Payer: Self-pay | Admitting: Family Medicine

## 2020-10-27 ENCOUNTER — Telehealth: Payer: BC Managed Care – PPO | Admitting: Family

## 2020-10-27 DIAGNOSIS — J069 Acute upper respiratory infection, unspecified: Secondary | ICD-10-CM | POA: Diagnosis not present

## 2020-10-27 DIAGNOSIS — Z20822 Contact with and (suspected) exposure to covid-19: Secondary | ICD-10-CM | POA: Diagnosis not present

## 2020-10-27 MED ORDER — FLUTICASONE PROPIONATE 50 MCG/ACT NA SUSP: 2.0000 | Freq: Every day | NASAL | 6 refills | Status: DC

## 2020-10-27 MED ORDER — BENZONATATE 100 MG PO CAPS: 100.0000 mg | ORAL_CAPSULE | Freq: Three times a day (TID) | ORAL | 0 refills | Status: DC | PRN

## 2020-10-27 NOTE — Progress Notes (Signed)

## 2020-11-06 ENCOUNTER — Other Ambulatory Visit: Payer: Self-pay | Admitting: Family Medicine

## 2020-11-08 NOTE — Telephone Encounter (Signed)
Dettinger. NTBS 30 days given 10/11/20

## 2020-11-15 ENCOUNTER — Other Ambulatory Visit: Payer: Self-pay | Admitting: Family Medicine

## 2020-11-27 ENCOUNTER — Other Ambulatory Visit: Payer: Self-pay | Admitting: Family Medicine

## 2020-11-27 DIAGNOSIS — F5101 Primary insomnia: Secondary | ICD-10-CM

## 2020-12-01 ENCOUNTER — Telehealth: Payer: BC Managed Care – PPO | Admitting: Emergency Medicine

## 2020-12-01 DIAGNOSIS — J019 Acute sinusitis, unspecified: Secondary | ICD-10-CM | POA: Diagnosis not present

## 2020-12-01 DIAGNOSIS — B9689 Other specified bacterial agents as the cause of diseases classified elsewhere: Secondary | ICD-10-CM

## 2020-12-01 MED ORDER — AMOXICILLIN-POT CLAVULANATE 875-125 MG PO TABS: 1.0000 | ORAL_TABLET | Freq: Two times a day (BID) | ORAL | 0 refills | Status: DC

## 2020-12-01 NOTE — Progress Notes (Signed)

## 2020-12-10 ENCOUNTER — Other Ambulatory Visit: Payer: Self-pay

## 2020-12-10 ENCOUNTER — Telehealth: Payer: Self-pay

## 2020-12-10 MED ORDER — MONTELUKAST SODIUM 10 MG PO TABS: 10.0000 mg | ORAL_TABLET | Freq: Every day | ORAL | 1 refills | Status: DC

## 2020-12-10 MED ORDER — MIRTAZAPINE 15 MG PO TABS: 15.0000 mg | ORAL_TABLET | Freq: Every day | ORAL | 1 refills | Status: DC

## 2020-12-10 NOTE — Telephone Encounter (Signed)
Yes okay to fill, just make sure he has visit for sometime in July and okay to give him enough through that time.Marland Kitchen

## 2020-12-10 NOTE — Telephone Encounter (Signed)
Pt informed and aware to keep appt in May

## 2020-12-10 NOTE — Telephone Encounter (Signed)
  Prescription Request  12/10/2020  What is the name of the medication or equipment? mirtazapine (REMERON) 15 MG tablet montelukast (SINGULAIR) 10 MG tablet    Have you contacted your pharmacy to request a refill? (if applicable)YES   Which pharmacy would you like this sent to? [facility] pt has appt with PCP DR. D. On 01/14/2021 but has ran out would like enough until appt. Please let pt know once [facility]    Patient notified that their request is being sent to the clinical staff for review and that they should receive a response within 2 business days.

## 2020-12-10 NOTE — Telephone Encounter (Signed)
Pt last seen for this 03/15/20-the other visits have been for acute stuff. Ok to fill?

## 2021-01-02 ENCOUNTER — Other Ambulatory Visit: Payer: Self-pay | Admitting: Family Medicine

## 2021-01-09 ENCOUNTER — Telehealth: Payer: BC Managed Care – PPO | Admitting: Family

## 2021-01-09 DIAGNOSIS — B354 Tinea corporis: Secondary | ICD-10-CM

## 2021-01-09 MED ORDER — KETOCONAZOLE 2 % EX CREA: 1.0000 "application " | TOPICAL_CREAM | Freq: Every day | CUTANEOUS | 1 refills | Status: DC

## 2021-01-09 NOTE — Progress Notes (Signed)
E Visit for Rash  We are sorry that you are not feeling well. Here is how we plan to help!   Based upon your presentation it appears you have a fungal infection.  I have prescribed: Ketoconazole cream that you will use twice a day.    HOME CARE:   Take cool showers and avoid direct sunlight.  Apply cool compress or wet dressings.  Take a bath in an oatmeal bath.  Sprinkle content of one Aveeno packet under running faucet with comfortably warm water.  Bathe for 15-20 minutes, 1-2 times daily.  Pat dry with a towel. Do not rub the rash.  Use hydrocortisone cream.  Take an antihistamine like Benadryl for widespread rashes that itch.  The adult dose of Benadryl is 25-50 mg by mouth 4 times daily.  Caution:  This type of medication may cause sleepiness.  Do not drink alcohol, drive, or operate dangerous machinery while taking antihistamines.  Do not take these medications if you have prostate enlargement.  Read package instructions thoroughly on all medications that you take.  GET HELP RIGHT AWAY IF:   Symptoms don't go away after treatment.  Severe itching that persists.  If you rash spreads or swells.  If you rash begins to smell.  If it blisters and opens or develops a yellow-brown crust.  You develop a fever.  You have a sore throat.  You become short of breath.  MAKE SURE YOU:  Understand these instructions. Will watch your condition. Will get help right away if you are not doing well or get worse.  Thank you for choosing an e-visit. Your e-visit answers were reviewed by a board certified advanced clinical practitioner to complete your personal care plan. Depending upon the condition, your plan could have included both over the counter or prescription medications. Please review your pharmacy choice. Be sure that the pharmacy you have chosen is open so that you can pick up your prescription now.  If there is a problem you may message your provider in MyChart to have  the prescription routed to another pharmacy. Your safety is important to [place]. If you have drug allergies check your prescription carefully.  For the next 24 hours, you can use MyChart to ask questions about today's visit, request a non-urgent call back, or ask for a work or school excuse from your e-visit provider. You will get an email in the next two days asking about your experience. I hope that your e-visit has been valuable and will speed your recovery.    Approximately 5 minutes was spent documenting and reviewing patient's chart.

## 2021-01-11 ENCOUNTER — Other Ambulatory Visit: Payer: Self-pay | Admitting: Family Medicine

## 2021-01-14 ENCOUNTER — Ambulatory Visit (INDEPENDENT_AMBULATORY_CARE_PROVIDER_SITE_OTHER): Payer: BC Managed Care – PPO | Admitting: Family Medicine

## 2021-01-14 ENCOUNTER — Other Ambulatory Visit: Payer: Self-pay

## 2021-01-14 ENCOUNTER — Encounter: Payer: Self-pay | Admitting: Family Medicine

## 2021-01-14 VITALS — BP 109/69 | HR 71 | Ht 65.0 in | Wt 194.0 lb

## 2021-01-14 DIAGNOSIS — Z0000 Encounter for general adult medical examination without abnormal findings: Secondary | ICD-10-CM

## 2021-01-14 DIAGNOSIS — F5101 Primary insomnia: Secondary | ICD-10-CM

## 2021-01-14 DIAGNOSIS — Z0001 Encounter for general adult medical examination with abnormal findings: Secondary | ICD-10-CM | POA: Diagnosis not present

## 2021-01-14 DIAGNOSIS — K219 Gastro-esophageal reflux disease without esophagitis: Secondary | ICD-10-CM | POA: Diagnosis not present

## 2021-01-14 MED ORDER — MONTELUKAST SODIUM 10 MG PO TABS: 10.0000 mg | ORAL_TABLET | Freq: Every day | ORAL | 3 refills | Status: DC

## 2021-01-14 MED ORDER — PANTOPRAZOLE SODIUM 40 MG PO TBEC: 40.0000 mg | DELAYED_RELEASE_TABLET | Freq: Every day | ORAL | 3 refills | Status: DC

## 2021-01-14 MED ORDER — MIRTAZAPINE 15 MG PO TABS: 15.0000 mg | ORAL_TABLET | Freq: Every day | ORAL | 3 refills | Status: DC

## 2021-01-14 NOTE — Progress Notes (Signed)
BP 109/69   Pulse 71   Ht _0  (1.651 m)   Wt 194 lb (88 kg)   SpO2 98%   BMI 32.28 kg/m    Subjective:   Patient ID: Chase Stout, male    DOB: [birth year], 40 y.o.   MRN: [id number]  HPI: Chase Stout is a 40 y.o. male presenting on 01/14/2021 for Medical Management of Chronic Issues (CPE)   HPI Adult well exam Patient denies any chest pain, shortness of breath, headaches or vision issues, abdominal complaints, diarrhea, nausea, vomiting, or joint issues.  Denies any major issues except for he does have a little ringworm on the back of his scalp that is healing and he has a cream for it is getting better already.  GERD Patient is currently on pantoprazole as needed.  She denies any major symptoms or abdominal pain or belching or burping. She denies any blood in her stool or lightheadedness or dizziness.  Insomnia recheck Patient takes mirtazapine to help with insomnia.  Seems to be doing well on those.  Denies any major suicidal ideations or thoughts of hurting self.  Patient takes testosterone from [facility] and does the pellets, wants to discuss possibly doing other forms of it and managed by [place] and we discussed that we possibly could but not the platelets.  He will discuss it with them and get the records for [place] for .  Relevant past medical, surgical, family and social history reviewed and updated as indicated. Interim medical history since our last visit reviewed. Allergies and medications reviewed and updated.  Review of Systems  Constitutional: Negative for chills and fever.  HENT: Negative for ear pain and tinnitus.   Eyes: Negative for pain.  Respiratory: Negative for cough, shortness of breath and wheezing.   Cardiovascular: Negative for chest pain, palpitations and leg swelling.  Gastrointestinal: Negative for abdominal pain, blood in stool, constipation and diarrhea.  Genitourinary: Negative for dysuria and hematuria.  Musculoskeletal: Negative for  back pain and myalgias.  Skin: Positive for rash (Ringworm on back of the scalp about 3 cm in diameter, very faint).  Neurological: Negative for dizziness, weakness and headaches.  Psychiatric/Behavioral: Negative for suicidal ideas.    Per HPI unless specifically indicated above   Allergies as of 01/14/2021   No Known Allergies     Medication List       Accurate as of Jan 14, 2021 10:01 AM. If you have any questions, ask your nurse or doctor.        STOP taking these medications   amoxicillin-clavulanate 875-125 MG tablet Commonly known as: Augmentin Stopped by: Fransisca Kaufmann Amayrany Cafaro, MD   benzonatate 100 MG capsule Commonly known as: Best boy Stopped by: Worthy Rancher, MD   Breo Ellipta 100-25 MCG/INH Aepb Generic drug: fluticasone furoate-vilanterol Stopped by: Worthy Rancher, MD   dexamethasone 2 MG tablet Commonly known as: DECADRON Stopped by: Fransisca Kaufmann Darshawn Boateng, MD   predniSONE 10 MG (21) Tbpk tablet Commonly known as: STERAPRED UNI-PAK 21 TAB Stopped by: Fransisca Kaufmann Quince Santana, MD     TAKE these medications   anastrozole 1 MG tablet Commonly known as: ARIMIDEX Take 1 mg by mouth daily.   fluticasone 50 MCG/ACT nasal spray Commonly known as: FLONASE Place 2 sprays into both nostrils daily.   ketoconazole 2 % cream Commonly known as: NIZORAL Apply 1 application topically daily.   meloxicam 15 MG tablet Commonly known as: MOBIC TAKE 1 TABLET BY MOUTH EVERY DAY  mirtazapine 15 MG tablet Commonly known as: REMERON Take 1 tablet (15 mg total) by mouth at bedtime.   montelukast 10 MG tablet Commonly known as: SINGULAIR Take 1 tablet (10 mg total) by mouth at bedtime.   pantoprazole 40 MG tablet Commonly known as: PROTONIX Take 1 tablet (40 mg total) by mouth daily. (Needs to be seen before next refill)   TESTOSTERONE IM   zolpidem 5 MG tablet Commonly known as: AMBIEN Take 1 tablet (5 mg total) by mouth at bedtime as needed for  sleep.        Objective:   BP 109/69   Pulse 71   Ht _0  (1.651 m)   Wt 194 lb (88 kg)   SpO2 98%   BMI 32.28 kg/m   Wt Readings from Last 3 Encounters:  01/14/21 194 lb (88 kg)  06/11/20 202 lb (91.6 kg)  03/15/20 207 lb (93.9 kg)    Physical Exam Vitals and nursing note reviewed.  Constitutional:      General: He is not in acute distress.    Appearance: He is well-developed. He is not diaphoretic.  HENT:     Right Ear: External ear normal.     Left Ear: External ear normal.     Nose: Nose normal.     Mouth/Throat:     Pharynx: No oropharyngeal exudate.  Eyes:     General: No scleral icterus.       Right eye: No discharge.     Conjunctiva/sclera: Conjunctivae normal.     Pupils: Pupils are equal, round, and reactive to light.  Neck:     Thyroid: No thyromegaly.  Cardiovascular:     Rate and Rhythm: Normal rate and regular rhythm.     Heart sounds: Normal heart sounds. No murmur heard.   Pulmonary:     Effort: Pulmonary effort is normal. No respiratory distress.     Breath sounds: Normal breath sounds. No wheezing.  Abdominal:     General: Bowel sounds are normal. There is no distension.     Palpations: Abdomen is soft.     Tenderness: There is no abdominal tenderness. There is no guarding or rebound.  Musculoskeletal:        General: Normal range of motion.     Cervical back: Neck supple.  Lymphadenopathy:     Cervical: No cervical adenopathy.  Skin:    General: Skin is warm and dry.     Findings: No rash.  Neurological:     Mental Status: He is alert and oriented to person, place, and time.     Coordination: Coordination normal.  Psychiatric:        Behavior: Behavior normal.       Assessment & Plan:   Problem List Items Addressed This Visit      Digestive   GERD (gastroesophageal reflux disease)   Relevant Medications   pantoprazole (PROTONIX) 40 MG tablet   Other Relevant Orders   TSH     Other   Insomnia   Relevant Orders   TSH     Other Visit Diagnoses    Physical exam    -  Primary   Relevant Orders   HIV Antibody (routine testing w rflx)   CBC with Differential/Platelet   CMP14+EGFR   Lipid panel   TSH      Pantoprazole continue, continue mirtazapine.  Continue Singulair.  No change in medication, seems to be doing well.  Discussed possibly managing his testosterone in the future and  we could possibly manage that but not the pellets and he will discuss it with his doctor there and consider coming test. Follow up plan: Return in about 6 months (around 07/17/2021), or if symptoms worsen or fail to improve, for Insomnia and GERD recheck.  Counseling provided for all of the vaccine components No orders of the defined types were placed in this encounter.   Caryl Pina, MD [facility] 01/14/2021, 10:01 AM

## 2021-01-15 ENCOUNTER — Other Ambulatory Visit: Payer: Self-pay | Admitting: Family Medicine

## 2021-01-15 LAB — LIPID PANEL
Chol/HDL Ratio: 4.9 ratio (ref 0.0–5.0)
Cholesterol, Total: 207 mg/dL — ABNORMAL HIGH (ref 100–199)
HDL: 42 mg/dL (ref >39)
LDL Chol Calc (NIH): 150 mg/dL — ABNORMAL HIGH (ref 0–99)
Triglycerides: 82 mg/dL (ref 0–149)
VLDL Cholesterol Cal: 15 mg/dL (ref 5–40)

## 2021-01-15 LAB — CBC WITH DIFFERENTIAL/PLATELET
Basophils Absolute: 0.1 10*3/uL (ref 0.0–0.2)
Basos: 1 %
EOS (ABSOLUTE): 0.5 10*3/uL — ABNORMAL HIGH (ref 0.0–0.4)
Eos: 4 %
Hematocrit: 43.3 % (ref 37.5–51.0)
Hemoglobin: 15 g/dL (ref 13.0–17.7)
Immature Grans (Abs): 0 10*3/uL (ref 0.0–0.1)
Immature Granulocytes: 0 %
Lymphocytes Absolute: 2.3 10*3/uL (ref 0.7–3.1)
Lymphs: 22 %
MCH: 30.5 pg (ref 26.6–33.0)
MCHC: 34.6 g/dL (ref 31.5–35.7)
MCV: 88 fL (ref 79–97)
Monocytes Absolute: 0.8 10*3/uL (ref 0.1–0.9)
Monocytes: 7 %
Neutrophils Absolute: 6.9 10*3/uL (ref 1.4–7.0)
Neutrophils: 66 %
Platelets: 288 10*3/uL (ref 150–450)
RBC: 4.92 x10E6/uL (ref 4.14–5.80)
RDW: 12.4 % (ref 11.6–15.4)
WBC: 10.6 10*3/uL (ref 3.4–10.8)

## 2021-01-15 LAB — CMP14+EGFR
ALT: 87 IU/L — ABNORMAL HIGH (ref 0–44)
AST: 218 IU/L — ABNORMAL HIGH (ref 0–40)
Albumin/Globulin Ratio: 2.4 — ABNORMAL HIGH (ref 1.2–2.2)
Albumin: 4.8 g/dL (ref 4.0–5.0)
Alkaline Phosphatase: 63 IU/L (ref 44–121)
BUN/Creatinine Ratio: 13 (ref 9–20)
BUN: 15 mg/dL (ref 6–24)
Bilirubin Total: 0.7 mg/dL (ref 0.0–1.2)
CO2: 23 mmol/L (ref 20–29)
Calcium: 9.7 mg/dL (ref 8.7–10.2)
Chloride: 106 mmol/L (ref 96–106)
Creatinine, Ser: 1.12 mg/dL (ref 0.76–1.27)
Globulin, Total: 2 g/dL (ref 1.5–4.5)
Glucose: 78 mg/dL (ref 65–99)
Potassium: 4.9 mmol/L (ref 3.5–5.2)
Sodium: 142 mmol/L (ref 134–144)
Total Protein: 6.8 g/dL (ref 6.0–8.5)
eGFR: 85 mL/min/{1.73_m2} (ref >59)

## 2021-01-15 LAB — TSH: TSH: 1.95 u[IU]/mL (ref 0.450–4.500)

## 2021-01-15 LAB — HIV ANTIBODY (ROUTINE TESTING W REFLEX): HIV Screen 4th Generation wRfx: NONREACTIVE

## 2021-01-21 ENCOUNTER — Other Ambulatory Visit: Payer: Self-pay

## 2021-01-21 DIAGNOSIS — R7989 Other specified abnormal findings of blood chemistry: Secondary | ICD-10-CM

## 2021-02-04 ENCOUNTER — Other Ambulatory Visit: Payer: BC Managed Care – PPO

## 2021-02-04 ENCOUNTER — Other Ambulatory Visit: Payer: Self-pay

## 2021-02-04 DIAGNOSIS — R7989 Other specified abnormal findings of blood chemistry: Secondary | ICD-10-CM | POA: Diagnosis not present

## 2021-02-05 LAB — ACUTE VIRAL HEPATITIS (HAV, HBV, HCV)
HCV Ab: <0.1 s/co ratio (ref 0.0–0.9)
Hep A IgM: NEGATIVE
Hep B C IgM: NEGATIVE
Hepatitis B Surface Ag: NEGATIVE

## 2021-02-05 LAB — HCV INTERPRETATION

## 2021-02-05 LAB — HEPATIC FUNCTION PANEL
ALT: 18 IU/L (ref 0–44)
AST: 18 IU/L (ref 0–40)
Albumin: 4.4 g/dL (ref 4.0–5.0)
Alkaline Phosphatase: 69 IU/L (ref 44–121)
Bilirubin Total: 0.5 mg/dL (ref 0.0–1.2)
Bilirubin, Direct: 0.12 mg/dL (ref 0.00–0.40)
Total Protein: 6.4 g/dL (ref 6.0–8.5)

## 2021-02-16 ENCOUNTER — Other Ambulatory Visit: Payer: Self-pay | Admitting: Family Medicine

## 2021-02-16 NOTE — Telephone Encounter (Signed)
I do not see on medication list

## 2021-04-13 ENCOUNTER — Telehealth: Payer: BC Managed Care – PPO | Admitting: Family Medicine

## 2021-04-13 DIAGNOSIS — J329 Chronic sinusitis, unspecified: Secondary | ICD-10-CM | POA: Diagnosis not present

## 2021-04-13 MED ORDER — DOXYCYCLINE HYCLATE 100 MG PO TABS: 100.0000 mg | ORAL_TABLET | Freq: Two times a day (BID) | ORAL | 0 refills | Status: AC

## 2021-04-13 NOTE — Progress Notes (Signed)

## 2021-04-30 ENCOUNTER — Other Ambulatory Visit: Payer: Self-pay | Admitting: Family Medicine

## 2021-07-05 ENCOUNTER — Encounter: Payer: Self-pay | Admitting: Family Medicine

## 2021-07-05 ENCOUNTER — Ambulatory Visit: Payer: BC Managed Care – PPO | Admitting: Family Medicine

## 2021-07-05 DIAGNOSIS — S70369A Insect bite (nonvenomous), unspecified thigh, initial encounter: Secondary | ICD-10-CM | POA: Diagnosis not present

## 2021-07-05 DIAGNOSIS — W57XXXA Bitten or stung by nonvenomous insect and other nonvenomous arthropods, initial encounter: Secondary | ICD-10-CM | POA: Diagnosis not present

## 2021-07-05 MED ORDER — DOXYCYCLINE HYCLATE 100 MG PO CAPS: 100.0000 mg | ORAL_CAPSULE | Freq: Two times a day (BID) | ORAL | 1 refills | Status: DC

## 2021-07-05 NOTE — Progress Notes (Signed)
Subjective:    Patient ID: Chase Stout, male    DOB: [birth year], 40 y.o.   MRN: [id number]   HPI: Chase Stout is a 40 y.o. male presenting for being out in woods 6 weeks ago and pulled off a couple of ticks that were embedded. At knee had a large red pimple without a head. It could have been there for a week. The other just itched real bad. A lot of numbness onset a few weeks later. Intermittent at shoulders down BUE, R>L. Can last 30-45 minutes at a time. Another a month ago at left wrist. No rash except local reaction. No bullseye.   Depression screen [facility] 2/9 01/14/2021 06/11/2020 03/15/2020 06/14/2018 06/03/2018  Decreased Interest 0 0 0 0 0  Down, Depressed, Hopeless 0 0 0 0 0  PHQ - 2 Score 0 0 0 0 0  Altered sleeping - - - - -  Tired, decreased energy - - - - -  Change in appetite - - - - -  Feeling bad or failure about yourself  - - - - -  Trouble concentrating - - - - -  Moving slowly or fidgety/restless - - - - -  Suicidal thoughts - - - - -  PHQ-9 Score - - - - -     Relevant past medical, surgical, family and social history reviewed and updated as indicated.  Interim medical history since our last visit reviewed. Allergies and medications reviewed and updated.  ROS:  Review of Systems  Constitutional:  Negative for fever.  Respiratory:  Negative for shortness of breath.   Cardiovascular:  Negative for chest pain.  Musculoskeletal:  Positive for arthralgias.  Skin:  Negative for rash.    Social History   Tobacco Use  Smoking Status Never  Smokeless Tobacco Never       Objective:     Wt Readings from Last 3 Encounters:  01/14/21 194 lb (88 kg)  06/11/20 202 lb (91.6 kg)  03/15/20 207 lb (93.9 kg)     Exam deferred. Pt. Harboring due to COVID 19. Phone visit performed.   Assessment & Plan:   1. Tick bite of thigh, unspecified laterality, initial encounter     Meds ordered this encounter  Medications   doxycycline (VIBRAMYCIN) 100 MG  capsule    Sig: Take 1 capsule (100 mg total) by mouth 2 (two) times daily.    Dispense:  60 capsule    Refill:  1     Orders Placed This Encounter  Procedures   Lyme Disease Serology w/Reflex   CBC with Differential/Platelet   CMP14+EGFR    Order Specific Question:   Has the patient fasted?    Answer:   Yes    Order Specific Question:   Release to patient    Answer:   Immediate       Diagnoses and all orders for this visit:  Tick bite of thigh, unspecified laterality, initial encounter -     Lyme Disease Serology w/Reflex -     CBC with Differential/Platelet -     CMP14+EGFR  Other orders -     doxycycline (VIBRAMYCIN) 100 MG capsule; Take 1 capsule (100 mg total) by mouth 2 (two) times daily.   Virtual Visit via telephone Note  I discussed the limitations, risks, security and privacy concerns of performing an evaluation and management service by telephone and the availability of in person appointments. The patient was identified with two identifiers. Pt.expressed understanding and  agreed to proceed. Pt. Is at home. Dr. Livia Snellen is in his office.  Follow Up Instructions:   I discussed the assessment and treatment plan with the patient. The patient was provided an opportunity to ask questions and all were answered. The patient agreed with the plan and demonstrated an understanding of the instructions.   The patient was advised to call back or seek an in-person evaluation if the symptoms worsen or if the condition fails to improve as anticipated.   Total minutes including chart review and phone contact time: 14   Follow up plan: Return if symptoms worsen or fail to improve.  Claretta Fraise, MD [place]

## 2021-07-06 ENCOUNTER — Other Ambulatory Visit: Payer: Self-pay

## 2021-07-06 ENCOUNTER — Other Ambulatory Visit: Payer: BC Managed Care – PPO

## 2021-07-06 DIAGNOSIS — W57XXXA Bitten or stung by nonvenomous insect and other nonvenomous arthropods, initial encounter: Secondary | ICD-10-CM | POA: Diagnosis not present

## 2021-07-06 DIAGNOSIS — S70369A Insect bite (nonvenomous), unspecified thigh, initial encounter: Secondary | ICD-10-CM | POA: Diagnosis not present

## 2021-07-07 LAB — CBC WITH DIFFERENTIAL/PLATELET
Basophils Absolute: 0.1 10*3/uL (ref 0.0–0.2)
Basos: 1 %
EOS (ABSOLUTE): 0.7 10*3/uL — ABNORMAL HIGH (ref 0.0–0.4)
Eos: 7 %
Hematocrit: 44.5 % (ref 37.5–51.0)
Hemoglobin: 15.1 g/dL (ref 13.0–17.7)
Immature Grans (Abs): 0.1 10*3/uL (ref 0.0–0.1)
Immature Granulocytes: 1 %
Lymphocytes Absolute: 2.4 10*3/uL (ref 0.7–3.1)
Lymphs: 26 %
MCH: 29.8 pg (ref 26.6–33.0)
MCHC: 33.9 g/dL (ref 31.5–35.7)
MCV: 88 fL (ref 79–97)
Monocytes Absolute: 0.7 10*3/uL (ref 0.1–0.9)
Monocytes: 7 %
Neutrophils Absolute: 5.4 10*3/uL (ref 1.4–7.0)
Neutrophils: 58 %
Platelets: 258 10*3/uL (ref 150–450)
RBC: 5.06 x10E6/uL (ref 4.14–5.80)
RDW: 11.8 % (ref 11.6–15.4)
WBC: 9.3 10*3/uL (ref 3.4–10.8)

## 2021-07-07 LAB — CMP14+EGFR
ALT: 16 IU/L (ref 0–44)
AST: 16 IU/L (ref 0–40)
Albumin/Globulin Ratio: 2.3 — ABNORMAL HIGH (ref 1.2–2.2)
Albumin: 4.6 g/dL (ref 4.0–5.0)
Alkaline Phosphatase: 66 IU/L (ref 44–121)
BUN/Creatinine Ratio: 10 (ref 9–20)
BUN: 13 mg/dL (ref 6–24)
Bilirubin Total: 0.7 mg/dL (ref 0.0–1.2)
CO2: 26 mmol/L (ref 20–29)
Calcium: 9.6 mg/dL (ref 8.7–10.2)
Chloride: 104 mmol/L (ref 96–106)
Creatinine, Ser: 1.34 mg/dL — ABNORMAL HIGH (ref 0.76–1.27)
Globulin, Total: 2 g/dL (ref 1.5–4.5)
Glucose: 86 mg/dL (ref 70–99)
Potassium: 5.1 mmol/L (ref 3.5–5.2)
Sodium: 144 mmol/L (ref 134–144)
Total Protein: 6.6 g/dL (ref 6.0–8.5)
eGFR: 68 mL/min/{1.73_m2} (ref >59)

## 2021-07-07 LAB — LYME DISEASE SEROLOGY W/REFLEX: Lyme Total Antibody EIA: NEGATIVE

## 2021-07-07 NOTE — Progress Notes (Signed)
Hello Imanol,  Your lab result is normal and/or stable.Some minor variations that are not significant are commonly marked abnormal, but do not represent any medical problem for you.  Best regards, Pete Merten, M.D.

## 2021-07-11 ENCOUNTER — Ambulatory Visit: Payer: BC Managed Care – PPO | Admitting: Family

## 2021-07-20 DIAGNOSIS — M542 Cervicalgia: Secondary | ICD-10-CM | POA: Diagnosis not present

## 2021-08-17 DIAGNOSIS — S4401XA Injury of ulnar nerve at upper arm level, right arm, initial encounter: Secondary | ICD-10-CM | POA: Diagnosis not present

## 2021-09-04 ENCOUNTER — Ambulatory Visit
Admission: EM | Admit: 2021-09-04 | Discharge: 2021-09-04 | Disposition: A | Discharge status: Home / Self Care | Payer: BC Managed Care – PPO | Attending: Family Medicine | Admitting: Family Medicine

## 2021-09-04 ENCOUNTER — Other Ambulatory Visit: Payer: Self-pay

## 2021-09-04 DIAGNOSIS — L02216 Cutaneous abscess of umbilicus: Secondary | ICD-10-CM

## 2021-09-04 MED ORDER — SULFAMETHOXAZOLE-TRIMETHOPRIM 800-160 MG PO TABS: 1.0000 | ORAL_TABLET | Freq: Two times a day (BID) | ORAL | 0 refills | Status: DC

## 2021-09-04 NOTE — ED Triage Notes (Signed)
Pt presents with reports pus draining  from umbilical area, also bruising noted above area, also reports bleeding

## 2021-09-08 NOTE — ED Provider Notes (Signed)
[facility]    CSN: [id number] Arrival date & time: 09/04/21  1420      History   Chief Complaint Chief Complaint  Patient presents with   Abscess    HPI Chase Stout is a 41 y.o. male.   Presenting today with infection at umbilicus that he started noticing yesterday. States the area above belly button has been sore for a few days and when he squeezed on the area yesterday yellow drainage began to ooze from his belly button along with bleeding. Now having some localized abdominal pain in the periumbilical region as well. Denies fever, chills, N/V/D/Constipation, injury to area. Not trying anything OTC for sxs.    Past Medical History:  Diagnosis Date   Allergy    Hydrocele 01/31/1999   small hydroceles bilaterally, small calcified loose body in left hemiscrotum   Kidney stones    Osteoarthritis of left shoulder 06/18/2012   Sinus infection     Patient Active Problem List   Diagnosis Date Noted   S/P ACL reconstruction 02/12/2018   GERD (gastroesophageal reflux disease) 06/14/2015   Asthma 04/29/2015   Morbid obesity ([place]) 04/29/2015   Vasomotor rhinitis 09/01/2014   Insomnia 11/22/2012   Anxiety 11/22/2012   Rotator cuff dysfunction 06/18/2012   Osteoarthritis of left shoulder 06/18/2012   Shoulder pain, left 06/18/2012    Past Surgical History:  Procedure Laterality Date   EYE SURGERY Bilateral    Lasix   KNEE ARTHROSCOPY Left 02/04/2018   Left ACL   LITHOTRIPSY     NASAL SINUS SURGERY     SHOULDER HEMI-ARTHROPLASTY  09/24/2012   Procedure: SHOULDER HEMI-ARTHROPLASTY;  Surgeon: Johnny Bridge, MD;  Location: [place];  Service: Orthopedics;  Laterality: Left;       Home Medications    Prior to Admission medications   Medication Sig Start Date End Date Taking? Authorizing Provider  sulfamethoxazole-trimethoprim (BACTRIM DS) 800-160 MG tablet Take 1 tablet by mouth 2 (two) times daily. 09/04/21  Yes Volney American, PA-C  anastrozole  (ARIMIDEX) 1 MG tablet Take 1 mg by mouth daily. 11/12/17   [provider]  BREO ELLIPTA 100-25 MCG/INH AEPB INHALE 1 PUFF INTO THE LUNGS DAILY 02/16/21   Dettinger, Fransisca Kaufmann, MD  doxycycline (VIBRAMYCIN) 100 MG capsule Take 1 capsule (100 mg total) by mouth 2 (two) times daily. 07/05/21   Claretta Fraise, MD  fluticasone (FLONASE) 50 MCG/ACT nasal spray Place 2 sprays into both nostrils daily. 10/27/20   Sharion Balloon, FNP  ketoconazole (NIZORAL) 2 % cream Apply 1 application topically daily. 01/09/21   Sharion Balloon, FNP  meloxicam (MOBIC) 15 MG tablet TAKE 1 TABLET BY MOUTH EVERY DAY 05/02/21   Dettinger, Fransisca Kaufmann, MD  mirtazapine (REMERON) 15 MG tablet Take 1 tablet (15 mg total) by mouth at bedtime. 01/14/21   Dettinger, Fransisca Kaufmann, MD  montelukast (SINGULAIR) 10 MG tablet Take 1 tablet (10 mg total) by mouth at bedtime. 01/14/21   Dettinger, Fransisca Kaufmann, MD  pantoprazole (PROTONIX) 40 MG tablet Take 1 tablet (40 mg total) by mouth daily. (Needs to be seen before next refill) 01/14/21   Dettinger, Fransisca Kaufmann, MD  TESTOSTERONE IM     [provider]  zolpidem (AMBIEN) 5 MG tablet Take 1 tablet (5 mg total) by mouth at bedtime as needed for sleep. 03/15/20   Dettinger, Fransisca Kaufmann, MD    Family History Family History  Problem Relation Age of Onset   Hypertension Mother  Cancer Father        lung   Hypertension Brother     Social History Social History   Tobacco Use   Smoking status: Never   Smokeless tobacco: Never  Vaping Use   Vaping Use: Never used  Substance Use Topics   Alcohol use: Yes    Alcohol/week: 2.0 standard drinks    Types: 2 Cans of beer per week    Comment: occasionally   Drug use: No     Allergies   Patient has no known allergies.   Review of Systems Review of Systems PER HPI  Physical Exam Triage Vital Signs ED Triage Vitals  Enc Vitals Group     BP 09/04/21 1525 116/76     Pulse Rate 09/04/21 1525 74     Resp 09/04/21 1525 20     Temp  09/04/21 1525 97.6 F (36.4 C)     Temp src --      SpO2 09/04/21 1525 95 %     Weight --      Height --      Head Circumference --      Peak Flow --      Pain Score 09/04/21 1523 5     Pain Loc --      Pain Edu? --      Excl. in [place]? --    No data found.  Updated Vital Signs BP 116/76    Pulse 74    Temp 97.6 F (36.4 C)    Resp 20    SpO2 95%   Visual Acuity Right Eye Distance:   Left Eye Distance:   Bilateral Distance:    Right Eye Near:   Left Eye Near:    Bilateral Near:     Physical Exam Vitals and nursing note reviewed.  Constitutional:      Appearance: Normal appearance.  HENT:     Head: Atraumatic.  Eyes:     Extraocular Movements: Extraocular movements intact.     Conjunctiva/sclera: Conjunctivae normal.  Cardiovascular:     Rate and Rhythm: Normal rate and regular rhythm.  Pulmonary:     Effort: Pulmonary effort is normal.     Breath sounds: Normal breath sounds.  Abdominal:     General: Bowel sounds are normal. There is no distension.     Palpations: Abdomen is soft.     Tenderness: There is abdominal tenderness. There is no guarding.     Comments: Minimal periumbilical ttp Thin yellow drainage from umbilicus with palpation of abdomen superior to umbilicus  Musculoskeletal:        General: Normal range of motion.     Cervical back: Normal range of motion and neck supple.  Skin:    General: Skin is warm and dry.  Neurological:     General: No focal deficit present.     Mental Status: He is oriented to person, place, and time.  Psychiatric:        Mood and Affect: Mood normal.        Thought Content: Thought content normal.        Judgment: Judgment normal.     UC Treatments / Results  Labs (all labs ordered are listed, but only abnormal results are displayed) Labs Reviewed - No data to display  EKG   Radiology No results found.  Procedures Procedures (including critical care time)  Medications Ordered in UC Medications - No data  to display  Initial Impression / Assessment and Plan / UC  Course  I have reviewed the triage vital signs and the nursing notes.  Pertinent labs & imaging results that were available during my care of the patient were reviewed by me and considered in my medical decision making (see chart for details).     Will cover for infection to area with bactrim, good home wound care and reviewed close ED f/u for any worsening or unresolving sxs.   Final Clinical Impressions(s) / UC Diagnoses   Final diagnoses:  Abscess, umbilical   Discharge Instructions   None    ED Prescriptions     Medication Sig Dispense Auth. Provider   sulfamethoxazole-trimethoprim (BACTRIM DS) 800-160 MG tablet Take 1 tablet by mouth 2 (two) times daily. 20 tablet Volney American, [place]      PDMP not reviewed this encounter.   Volney American, [place] 09/08/21 2246

## 2022-02-08 ENCOUNTER — Other Ambulatory Visit: Payer: Self-pay | Admitting: Family Medicine

## 2022-03-23 ENCOUNTER — Other Ambulatory Visit: Payer: Self-pay | Admitting: Family Medicine

## 2022-03-24 ENCOUNTER — Other Ambulatory Visit: Payer: Self-pay | Admitting: Family Medicine

## 2022-03-30 ENCOUNTER — Ambulatory Visit: Payer: BC Managed Care – PPO | Admitting: Family Medicine

## 2022-03-30 ENCOUNTER — Encounter: Payer: Self-pay | Admitting: Family Medicine

## 2022-03-30 VITALS — BP 122/78 | HR 89 | Temp 98.0°F | Ht 65.0 in | Wt 203.0 lb

## 2022-03-30 DIAGNOSIS — M722 Plantar fascial fibromatosis: Secondary | ICD-10-CM

## 2022-03-30 DIAGNOSIS — M79671 Pain in right foot: Secondary | ICD-10-CM | POA: Diagnosis not present

## 2022-03-30 MED ORDER — METHYLPREDNISOLONE ACETATE 40 MG/ML IJ SUSP
40.0000 mg | Freq: Once | INTRAMUSCULAR | Status: AC
  Administered 2022-03-30: 40 mg via INTRAMUSCULAR

## 2022-03-30 NOTE — Progress Notes (Addendum)
Subjective:  Patient ID: Chase Stout, male    DOB: 03/14/1981, 41 y.o.   MRN: [id number]  Patient Care Team: Dettinger, Fransisca Kaufmann, MD as PCP - General (Family Medicine)   Chief Complaint:  Foot Pain   HPI: Chase Stout is a 41 y.o. male presenting on 03/30/2022 for Foot Pain   Foot Pain This is a recurrent problem. The current episode started 1 to 4 weeks ago. The problem occurs intermittently. The problem has been waxing and waning (worse with first steps in the morning). Associated symptoms include myalgias. Pertinent negatives include no abdominal pain, anorexia, arthralgias, change in bowel habit, chest pain, chills, congestion, coughing, diaphoresis, fatigue, fever, headaches, joint swelling, nausea, neck pain, numbness, rash, sore throat, swollen glands, urinary symptoms, vertigo, visual change, vomiting or weakness. The symptoms are aggravated by walking and standing. He has tried ice for the symptoms. The treatment provided mild relief.      Relevant past medical, surgical, family, and social history reviewed and updated as indicated.  Allergies and medications reviewed and updated. Data reviewed: Chart in Epic.   Past Medical History:  Diagnosis Date   Allergy    Hydrocele 01/31/1999   small hydroceles bilaterally, small calcified loose body in left hemiscrotum   Kidney stones    Osteoarthritis of left shoulder 06/18/2012   Sinus infection     Past Surgical History:  Procedure Laterality Date   EYE SURGERY Bilateral    Lasix   KNEE ARTHROSCOPY Left 02/04/2018   Left ACL   LITHOTRIPSY     NASAL SINUS SURGERY     SHOULDER HEMI-ARTHROPLASTY  09/24/2012   Procedure: SHOULDER HEMI-ARTHROPLASTY;  Surgeon: Johnny Bridge, MD;  Location: [place];  Service: Orthopedics;  Laterality: Left;    Social History   Socioeconomic History   Marital status: Divorced    Spouse name: Not on file   Number of children: Not on file   Years of education: 14   Highest  education level: Not on file  Occupational History    Employer: [employer]  Tobacco Use   Smoking status: Never   Smokeless tobacco: Never  Vaping Use   Vaping Use: Never used  Substance and Sexual Activity   Alcohol use: Yes    Alcohol/week: 2.0 standard drinks of alcohol    Types: 2 Cans of beer per week    Comment: occasionally   Drug use: No   Sexual activity: Not on file  Other Topics Concern   Not on file  Social History Narrative   Not on file   Social Determinants of Health   Financial Resource Strain: Not on file  Food Insecurity: Not on file  Transportation Needs: Not on file  Physical Activity: Not on file  Stress: Not on file  Social Connections: Not on file  Intimate Partner Violence: Not on file    Outpatient Encounter Medications as of 03/30/2022  Medication Sig   anastrozole (ARIMIDEX) 1 MG tablet Take 1 mg by mouth daily.   ketoconazole (NIZORAL) 2 % cream Apply 1 application topically daily.   meloxicam (MOBIC) 15 MG tablet TAKE 1 TABLET BY MOUTH EVERY DAY   mirtazapine (REMERON) 15 MG tablet Take 1 tablet (15 mg total) by mouth at bedtime.   montelukast (SINGULAIR) 10 MG tablet Take 1 tablet (10 mg total) by mouth at bedtime. (NEEDS TO BE SEEN BEFORE NEXT REFILL)   pantoprazole (PROTONIX) 40 MG tablet Take 1 tablet (40 mg total) by  mouth daily. (Needs to be seen before next refill)   TESTOSTERONE IM    [DISCONTINUED] BREO ELLIPTA 100-25 MCG/INH AEPB INHALE 1 PUFF INTO THE LUNGS DAILY   [DISCONTINUED] zolpidem (AMBIEN) 5 MG tablet Take 1 tablet (5 mg total) by mouth at bedtime as needed for sleep.   [DISCONTINUED] doxycycline (VIBRAMYCIN) 100 MG capsule Take 1 capsule (100 mg total) by mouth 2 (two) times daily.   [DISCONTINUED] fluticasone (FLONASE) 50 MCG/ACT nasal spray Place 2 sprays into both nostrils daily.   [DISCONTINUED] sulfamethoxazole-trimethoprim (BACTRIM DS) 800-160 MG tablet Take 1 tablet by mouth 2 (two) times daily.   [EXPIRED]  methylPREDNISolone acetate (DEPO-MEDROL) injection 40 mg    No facility-administered encounter medications on file as of 03/30/2022.    No Known Allergies  Review of Systems  Constitutional:  Negative for activity change, appetite change, chills, diaphoresis, fatigue and fever.  HENT: Negative.  Negative for congestion and sore throat.   Eyes: Negative.   Respiratory:  Negative for cough, chest tightness and shortness of breath.   Cardiovascular:  Negative for chest pain, palpitations and leg swelling.  Gastrointestinal:  Negative for abdominal pain, anorexia, blood in stool, change in bowel habit, constipation, diarrhea, nausea and vomiting.  Endocrine: Negative.   Genitourinary:  Negative for dysuria, frequency and urgency.  Musculoskeletal:  Positive for gait problem and myalgias. Negative for arthralgias, back pain, joint swelling, neck pain and neck stiffness.  Skin: Negative.  Negative for rash.  Allergic/Immunologic: Negative.   Neurological:  Negative for dizziness, vertigo, weakness, numbness and headaches.  Hematological: Negative.   Psychiatric/Behavioral:  Negative for confusion, hallucinations, sleep disturbance and suicidal ideas.   All other systems reviewed and are negative.       Objective:  BP 122/78   Pulse 89   Temp 98 F (36.7 C)   Ht _0  (1.651 m)   Wt 203 lb (92.1 kg)   SpO2 96%   BMI 33.78 kg/m    Wt Readings from Last 3 Encounters:  03/30/22 203 lb (92.1 kg)  01/14/21 194 lb (88 kg)  06/11/20 202 lb (91.6 kg)    Physical Exam Vitals and nursing note reviewed.  Constitutional:      General: He is not in acute distress.    Appearance: Normal appearance. He is obese. He is not ill-appearing, toxic-appearing or diaphoretic.  HENT:     Head: Normocephalic and atraumatic.  Eyes:     Pupils: Pupils are equal, round, and reactive to light.  Cardiovascular:     Rate and Rhythm: Normal rate and regular rhythm.     Pulses: Normal pulses.           Dorsalis pedis pulses are 2+ on the right side and 2+ on the left side.       Posterior tibial pulses are 2+ on the right side and 2+ on the left side.  Pulmonary:     Effort: Pulmonary effort is normal.     Breath sounds: Normal breath sounds.  Musculoskeletal:     Right lower leg: No edema.     Left lower leg: No edema.       Feet:  Feet:     Right foot:     Skin integrity: Skin integrity normal.     Left foot:     Skin integrity: Skin integrity normal.  Skin:    General: Skin is warm and dry.     Capillary Refill: Capillary refill takes less than 2 seconds.  Neurological:  General: No focal deficit present.     Mental Status: He is alert and oriented to person, place, and time.  Psychiatric:        Mood and Affect: Mood normal.        Behavior: Behavior normal.        Thought Content: Thought content normal.        Judgment: Judgment normal.     Results for orders placed or performed in visit on 07/05/21  Lyme Disease Serology w/Reflex  Result Value Ref Range   Lyme Total Antibody EIA Negative Negative  CBC with Differential/Platelet  Result Value Ref Range   WBC 9.3 3.4 - 10.8 x10E3/uL   RBC 5.06 4.14 - 5.80 x10E6/uL   Hemoglobin 15.1 13.0 - 17.7 g/dL   Hematocrit 44.5 37.5 - 51.0 %   MCV 88 79 - 97 fL   MCH 29.8 26.6 - 33.0 pg   MCHC 33.9 31.5 - 35.7 g/dL   RDW 11.8 11.6 - 15.4 %   Platelets 258 150 - 450 x10E3/uL   Neutrophils 58 Not Estab. %   Lymphs 26 Not Estab. %   Monocytes 7 Not Estab. %   Eos 7 Not Estab. %   Basos 1 Not Estab. %   Neutrophils Absolute 5.4 1.4 - 7.0 x10E3/uL   Lymphocytes Absolute 2.4 0.7 - 3.1 x10E3/uL   Monocytes Absolute 0.7 0.1 - 0.9 x10E3/uL   EOS (ABSOLUTE) 0.7 (H) 0.0 - 0.4 x10E3/uL   Basophils Absolute 0.1 0.0 - 0.2 x10E3/uL   Immature Granulocytes 1 Not Estab. %   Immature Grans (Abs) 0.1 0.0 - 0.1 x10E3/uL  CMP14+EGFR  Result Value Ref Range   Glucose 86 70 - 99 mg/dL   BUN 13 6 - 24 mg/dL   Creatinine, Ser 1.34  (H) 0.76 - 1.27 mg/dL   eGFR 68 >59 mL/min/1.73   BUN/Creatinine Ratio 10 9 - 20   Sodium 144 134 - 144 mmol/L   Potassium 5.1 3.5 - 5.2 mmol/L   Chloride 104 96 - 106 mmol/L   CO2 26 20 - 29 mmol/L   Calcium 9.6 8.7 - 10.2 mg/dL   Total Protein 6.6 6.0 - 8.5 g/dL   Albumin 4.6 4.0 - 5.0 g/dL   Globulin, Total 2.0 1.5 - 4.5 g/dL   Albumin/Globulin Ratio 2.3 (H) 1.2 - 2.2   Bilirubin Total 0.7 0.0 - 1.2 mg/dL   Alkaline Phosphatase 66 44 - 121 IU/L   AST 16 0 - 40 IU/L   ALT 16 0 - 44 IU/L     Joint Injection/Arthrocentesis  Date/Time: 03/30/2022 4:40 PM  Performed by: Baruch Gouty, FNP Authorized by: Baruch Gouty, FNP  Indications: pain  Location: right foot / plantar fascia. Local anesthesia used: yes  Anesthesia: Local anesthesia used: yes Local Anesthetic: co-phenylcaine spray  Sedation: Patient sedated: no  Preparation: Patient was prepped and draped in the usual sterile fashion. Needle size: 22 G Ultrasound guidance: no Approach: medial Aspirate amount: 0 mL Methylprednisolone amount: 30 mg Lidocaine 2% amount: 1.25 mL Patient tolerance: patient tolerated the procedure well with no immediate complications      Pertinent labs & imaging results that were available during my care of the patient were reviewed by me and considered in my medical decision making.  Assessment & Plan:  Chase Stout was seen today for foot pain.  Diagnoses and all orders for this visit:  Plantar fasciitis Consent obtained and injection completed. Tolerated well. Symptomatic care discussed in detail. Aware  to report new, worsening, or persistent symptoms.  -     methylPREDNISolone acetate (DEPO-MEDROL) injection 40 mg -     Joint Injection/Arthrocentesis     Continue all other maintenance medications.  Follow up plan: Return if symptoms worsen or fail to improve.   Continue healthy lifestyle choices, including diet (rich in fruits, vegetables, and lean proteins, and low in  salt and simple carbohydrates) and exercise (at least 30 minutes of moderate physical activity daily).  Educational handout given for plantar fasciitis   The above assessment and management plan was discussed with the patient. The patient verbalized understanding of and has agreed to the management plan. Patient is aware to call the clinic if they develop any new symptoms or if symptoms persist or worsen. Patient is aware when to return to the clinic for a follow-up visit. Patient educated on when it is appropriate to go to the emergency department.   Monia Pouch, FNP-C [facility] [phone]

## 2022-04-18 ENCOUNTER — Other Ambulatory Visit: Payer: Self-pay | Admitting: Family Medicine

## 2022-05-16 ENCOUNTER — Other Ambulatory Visit: Payer: Self-pay | Admitting: Family Medicine

## 2022-05-16 NOTE — Telephone Encounter (Signed)
Needs appt with pcp

## 2022-06-14 ENCOUNTER — Encounter: Payer: Self-pay | Admitting: Family Medicine

## 2022-06-14 ENCOUNTER — Ambulatory Visit: Payer: BC Managed Care – PPO | Admitting: Family Medicine

## 2022-06-14 VITALS — BP 114/68 | HR 92 | Temp 97.6°F | Ht 65.0 in | Wt 209.0 lb

## 2022-06-14 DIAGNOSIS — K219 Gastro-esophageal reflux disease without esophagitis: Secondary | ICD-10-CM

## 2022-06-14 DIAGNOSIS — Z13220 Encounter for screening for lipoid disorders: Secondary | ICD-10-CM

## 2022-06-14 DIAGNOSIS — J4541 Moderate persistent asthma with (acute) exacerbation: Secondary | ICD-10-CM

## 2022-06-14 MED ORDER — PANTOPRAZOLE SODIUM 40 MG PO TBEC
40.0000 mg | DELAYED_RELEASE_TABLET | Freq: Every day | ORAL | 3 refills | Status: DC
Start: 2022-06-14 — End: 2023-06-29

## 2022-06-14 MED ORDER — MONTELUKAST SODIUM 10 MG PO TABS
10.0000 mg | ORAL_TABLET | Freq: Every day | ORAL | 3 refills | Status: DC
Start: 2022-06-14 — End: 2023-06-29

## 2022-06-14 MED ORDER — MIRTAZAPINE 15 MG PO TABS
15.0000 mg | ORAL_TABLET | Freq: Every day | ORAL | 3 refills | Status: DC
Start: 2022-06-14 — End: 2023-07-26

## 2022-06-14 NOTE — Progress Notes (Signed)
BP 114/68   Pulse 92   Temp 97.6 F (36.4 C)   Ht '5\' 5"'  (1.651 m)   Wt 209 lb (94.8 kg)   SpO2 98%   BMI 34.78 kg/m    Subjective:   Patient ID: Chase Stout, male    DOB: Nov 15, 1980, 41 y.o.   MRN: [id number]  HPI: Chase Stout is a 41 y.o. male presenting on 06/14/2022 for Medical Management of Chronic Issues and Asthma   HPI Asthma and allergies Patient has been off of the Singulair for couple weeks and his asthma and allergies have been flaring up a little bit more.  He says he has some irritation in the back of his throat.  He denies any wheezing.  He is having some coughing but he knows that is since he gets back on it he should do better.  Patient denies any fevers or chills.  Patient says he has been taking the Remeron for sleep, but he has been sleeping better and sometimes he feels too much and he wants to see about backing off on it.  He will trial off of it and see from there how he does.  GERD Patient is currently on pantoprazole as needed.  She denies any major symptoms or abdominal pain or belching or burping. She denies any blood in her stool or lightheadedness or dizziness.   Relevant past medical, surgical, family and social history reviewed and updated as indicated. Interim medical history since our last visit reviewed. Allergies and medications reviewed and updated.  Review of Systems  Constitutional:  Negative for chills and fever.  HENT:  Positive for congestion.   Eyes:  Negative for discharge.  Respiratory:  Positive for cough. Negative for shortness of breath and wheezing.   Cardiovascular:  Negative for chest pain and leg swelling.  Musculoskeletal:  Negative for back pain and gait problem.  Skin:  Negative for rash.  All other systems reviewed and are negative.   Per HPI unless specifically indicated above   Allergies as of 06/14/2022   No Known Allergies      Medication List        Accurate as of June 14, 2022 11:56 AM. If  you have any questions, ask your nurse or doctor.          anastrozole 1 MG tablet Commonly known as: ARIMIDEX Take 1 mg by mouth daily.   ketoconazole 2 % cream Commonly known as: NIZORAL Apply 1 application topically daily.   meloxicam 15 MG tablet Commonly known as: MOBIC TAKE 1 TABLET BY MOUTH EVERY DAY   mirtazapine 15 MG tablet Commonly known as: REMERON Take 1 tablet (15 mg total) by mouth at bedtime.   montelukast 10 MG tablet Commonly known as: SINGULAIR Take 1 tablet (10 mg total) by mouth at bedtime. What changed: additional instructions Changed by: Fransisca Kaufmann Amia Rynders, MD   pantoprazole 40 MG tablet Commonly known as: PROTONIX Take 1 tablet (40 mg total) by mouth daily. What changed: additional instructions Changed by: Fransisca Kaufmann Delila Kuklinski, MD   TESTOSTERONE IM         Objective:   BP 114/68   Pulse 92   Temp 97.6 F (36.4 C)   Ht '5\' 5"'  (1.651 m)   Wt 209 lb (94.8 kg)   SpO2 98%   BMI 34.78 kg/m   Wt Readings from Last 3 Encounters:  06/14/22 209 lb (94.8 kg)  03/30/22 203 lb (92.1 kg)  01/14/21 194 lb (88  kg)    Physical Exam Vitals and nursing note reviewed.  Constitutional:      General: He is not in acute distress.    Appearance: He is well-developed. He is not diaphoretic.  Eyes:     General: No scleral icterus.    Conjunctiva/sclera: Conjunctivae normal.  Neck:     Thyroid: No thyromegaly.  Cardiovascular:     Rate and Rhythm: Normal rate and regular rhythm.     Heart sounds: Normal heart sounds. No murmur heard. Pulmonary:     Effort: Pulmonary effort is normal. No respiratory distress.     Breath sounds: Normal breath sounds. No wheezing.  Musculoskeletal:        General: Normal range of motion.     Cervical back: Neck supple.  Lymphadenopathy:     Cervical: No cervical adenopathy.  Skin:    General: Skin is warm and dry.     Findings: No rash.  Neurological:     Mental Status: He is alert and oriented to person,  place, and time.     Coordination: Coordination normal.  Psychiatric:        Behavior: Behavior normal.       Assessment & Plan:   Problem List Items Addressed This Visit       Respiratory   Asthma   Relevant Medications   montelukast (SINGULAIR) 10 MG tablet     Digestive   GERD (gastroesophageal reflux disease) - Primary   Relevant Medications   pantoprazole (PROTONIX) 40 MG tablet   Other Relevant Orders   CBC with Differential/Platelet   CMP14+EGFR   Other Visit Diagnoses     Lipid screening       Relevant Orders   Lipid panel     We will restart aspirin allergy medicine, he will try and come off the Remeron and see how it does and let us know.  He only uses Protonix as needed so we will keep that.  We will do blood work today.  Follow up plan: Return in about 1 year (around 06/15/2023), or if symptoms worsen or fail to improve, for Physical and asthma and allergy.  Counseling provided for all of the vaccine components Orders Placed This Encounter  Procedures   CBC with Differential/Platelet   CMP14+EGFR   Lipid panel    Caryl Pina, MD [facility] 06/14/2022, 11:56 AM

## 2022-06-15 LAB — CBC WITH DIFFERENTIAL/PLATELET
Basophils Absolute: 0.1 10*3/uL (ref 0.0–0.2)
Basos: 1 %
EOS (ABSOLUTE): 0.4 10*3/uL (ref 0.0–0.4)
Eos: 5 %
Hematocrit: 48.7 % (ref 37.5–51.0)
Hemoglobin: 16.8 g/dL (ref 13.0–17.7)
Immature Grans (Abs): 0.1 10*3/uL (ref 0.0–0.1)
Immature Granulocytes: 1 %
Lymphocytes Absolute: 1.9 10*3/uL (ref 0.7–3.1)
Lymphs: 21 %
MCH: 31.7 pg (ref 26.6–33.0)
MCHC: 34.5 g/dL (ref 31.5–35.7)
MCV: 92 fL (ref 79–97)
Monocytes Absolute: 0.8 10*3/uL (ref 0.1–0.9)
Monocytes: 9 %
Neutrophils Absolute: 5.7 10*3/uL (ref 1.4–7.0)
Neutrophils: 63 %
Platelets: 249 10*3/uL (ref 150–450)
RBC: 5.3 x10E6/uL (ref 4.14–5.80)
RDW: 12 % (ref 11.6–15.4)
WBC: 9.1 10*3/uL (ref 3.4–10.8)

## 2022-06-15 LAB — LIPID PANEL
Chol/HDL Ratio: 5.5 ratio — ABNORMAL HIGH (ref 0.0–5.0)
Cholesterol, Total: 215 mg/dL — ABNORMAL HIGH (ref 100–199)
HDL: 39 mg/dL — ABNORMAL LOW (ref >39)
LDL Chol Calc (NIH): 149 mg/dL — ABNORMAL HIGH (ref 0–99)
Triglycerides: 147 mg/dL (ref 0–149)
VLDL Cholesterol Cal: 27 mg/dL (ref 5–40)

## 2022-06-15 LAB — CMP14+EGFR
ALT: 24 IU/L (ref 0–44)
AST: 23 IU/L (ref 0–40)
Albumin/Globulin Ratio: 2.2 (ref 1.2–2.2)
Albumin: 4.7 g/dL (ref 4.1–5.1)
Alkaline Phosphatase: 49 IU/L (ref 44–121)
BUN/Creatinine Ratio: 13 (ref 9–20)
BUN: 14 mg/dL (ref 6–24)
Bilirubin Total: 0.4 mg/dL (ref 0.0–1.2)
CO2: 20 mmol/L (ref 20–29)
Calcium: 9.2 mg/dL (ref 8.7–10.2)
Chloride: 106 mmol/L (ref 96–106)
Creatinine, Ser: 1.11 mg/dL (ref 0.76–1.27)
Globulin, Total: 2.1 g/dL (ref 1.5–4.5)
Glucose: 92 mg/dL (ref 70–99)
Potassium: 4.3 mmol/L (ref 3.5–5.2)
Sodium: 143 mmol/L (ref 134–144)
Total Protein: 6.8 g/dL (ref 6.0–8.5)
eGFR: 84 mL/min/{1.73_m2} (ref >59)

## 2022-06-21 ENCOUNTER — Other Ambulatory Visit: Payer: Self-pay

## 2022-06-21 MED ORDER — ROSUVASTATIN CALCIUM 5 MG PO TABS: 5.0000 mg | ORAL_TABLET | Freq: Every evening | ORAL | 1 refills | Status: DC

## 2022-08-16 ENCOUNTER — Other Ambulatory Visit: Payer: Self-pay | Admitting: Orthopedic Surgery

## 2022-08-16 DIAGNOSIS — M25519 Pain in unspecified shoulder: Secondary | ICD-10-CM

## 2022-08-18 ENCOUNTER — Ambulatory Visit
Admission: RE | Admit: 2022-08-18 | Discharge: 2022-08-18 | Disposition: A | Discharge status: Home / Self Care | Payer: Worker's Compensation | Source: Ambulatory Visit | Attending: Orthopedic Surgery | Admitting: Orthopedic Surgery

## 2022-08-18 DIAGNOSIS — M25519 Pain in unspecified shoulder: Secondary | ICD-10-CM

## 2022-08-22 ENCOUNTER — Other Ambulatory Visit: Payer: Self-pay | Admitting: Orthopedic Surgery

## 2022-08-22 DIAGNOSIS — M25511 Pain in right shoulder: Secondary | ICD-10-CM

## 2022-08-24 ENCOUNTER — Other Ambulatory Visit: Payer: Self-pay

## 2022-09-29 ENCOUNTER — Ambulatory Visit: Payer: BC Managed Care – PPO | Admitting: Family Medicine

## 2022-09-29 ENCOUNTER — Encounter: Payer: Self-pay | Admitting: Family Medicine

## 2022-09-29 VITALS — BP 119/79 | HR 97 | Ht 65.0 in | Wt 219.0 lb

## 2022-09-29 DIAGNOSIS — Z23 Encounter for immunization: Secondary | ICD-10-CM

## 2022-09-29 DIAGNOSIS — E291 Testicular hypofunction: Secondary | ICD-10-CM | POA: Diagnosis not present

## 2022-09-29 MED ORDER — ROSUVASTATIN CALCIUM 5 MG PO TABS: 5.0000 mg | ORAL_TABLET | Freq: Every evening | ORAL | 1 refills | Status: DC

## 2022-09-29 NOTE — Progress Notes (Signed)
I Chase Stout is a you  BP 119/79   Pulse 97   Ht 5\' 5"  (1.651 m)   Wt 219 lb (99.3 kg)   SpO2 (!) 81%   BMI 36.44 kg/m    Subjective:   Patient ID: Chase Stout, male    DOB: 1981/05/24, 42 y.o.   MRN: [id number]  HPI: Chase Stout is a 42 y.o. male presenting on 09/29/2022 for Medical Management of Chronic Issues and Testosterone Deficiency (Wants to get injections here)   HPI Patient has a history of testosterone deficiency.  He is currently been taking testosterone for a few years and is also on Arimidex for high estrogen levels.  He does not know why he is on the second 1.  He was started on the first 1 because of not feeling as good and he says it is made him feel a lot better mood wise and everything, he was started on both by a Psychiatric nurse.  Relevant past medical, surgical, family and social history reviewed and updated as indicated. Interim medical history since our last visit reviewed. Allergies and medications reviewed and updated.  Review of Systems  Constitutional:  Negative for chills and fever.  Respiratory:  Negative for shortness of breath and wheezing.   Cardiovascular:  Negative for chest pain and leg swelling.  Musculoskeletal:  Negative for back pain and gait problem.  Skin:  Negative for rash.  Psychiatric/Behavioral:  Negative for dysphoric mood, self-injury, sleep disturbance and suicidal ideas. The patient is not nervous/anxious.   All other systems reviewed and are negative.   Per HPI unless specifically indicated above   Allergies as of 09/29/2022   No Known Allergies      Medication List        Accurate as of September 29, 2022  1:32 PM. If you have any questions, ask your nurse or doctor.          STOP taking these medications    ketoconazole 2 % cream Commonly known as: NIZORAL Stopped by: Chase Stout Chase Koloski, MD       TAKE these medications    anastrozole 1 MG tablet Commonly known as: ARIMIDEX Take 1 mg by mouth  daily.   meloxicam 15 MG tablet Commonly known as: MOBIC TAKE 1 TABLET BY MOUTH EVERY DAY   mirtazapine 15 MG tablet Commonly known as: REMERON Take 1 tablet (15 mg total) by mouth at bedtime.   montelukast 10 MG tablet Commonly known as: SINGULAIR Take 1 tablet (10 mg total) by mouth at bedtime.   pantoprazole 40 MG tablet Commonly known as: PROTONIX Take 1 tablet (40 mg total) by mouth daily.   rosuvastatin 5 MG tablet Commonly known as: Crestor Take 1 tablet (5 mg total) by mouth at bedtime.   TESTOSTERONE IM         Objective:   BP 119/79   Pulse 97   Ht 5\' 5"  (1.651 m)   Wt 219 lb (99.3 kg)   SpO2 (!) 81%   BMI 36.44 kg/m   Wt Readings from Last 3 Encounters:  09/29/22 219 lb (99.3 kg)  06/14/22 209 lb (94.8 kg)  03/30/22 203 lb (92.1 kg)    Physical Exam Vitals and nursing note reviewed.  Constitutional:      General: He is not in acute distress.    Appearance: He is well-developed. He is not diaphoretic.  Eyes:     General: No scleral icterus.    Conjunctiva/sclera: Conjunctivae normal.  Neck:  Thyroid: No thyromegaly.  Skin:    General: Skin is warm and dry.     Findings: No rash.  Neurological:     Mental Status: He is alert and oriented to person, place, and time.     Coordination: Coordination normal.  Psychiatric:        Behavior: Behavior normal.       Assessment & Plan:   Problem List Items Addressed This Visit   None Visit Diagnoses     Testosterone deficiency in male    -  Primary   Relevant Orders   CMP14+EGFR   CBC with Differential/Platelet   Testosterone,Free and Total   TSH+FSH+TestT+LH+T3+DHEA-S+...   Ambulatory referral to Endocrinology   Need for Tdap vaccination       Relevant Orders   Tdap vaccine greater than or equal to 7yo IM (Completed)       Will send to endocrinology for further testing and workup. Follow up plan: Return if symptoms worsen or fail to improve.  Counseling provided for all of the  vaccine components Orders Placed This Encounter  Procedures   Tdap vaccine greater than or equal to 7yo IM   CMP14+EGFR   CBC with Differential/Platelet   Testosterone,Free and Total   TSH+FSH+TestT+LH+T3+DHEA-S+...   Ambulatory referral to Endocrinology    Caryl Pina, MD [facility] 09/29/2022, 1:32 PM

## 2022-10-21 ENCOUNTER — Telehealth: Payer: BC Managed Care – PPO | Admitting: Family Medicine

## 2022-10-21 DIAGNOSIS — B9689 Other specified bacterial agents as the cause of diseases classified elsewhere: Secondary | ICD-10-CM | POA: Diagnosis not present

## 2022-10-21 DIAGNOSIS — J019 Acute sinusitis, unspecified: Secondary | ICD-10-CM | POA: Diagnosis not present

## 2022-10-21 MED ORDER — AMOXICILLIN-POT CLAVULANATE 875-125 MG PO TABS: 1.0000 | ORAL_TABLET | Freq: Two times a day (BID) | ORAL | 0 refills | Status: DC

## 2022-10-21 NOTE — Progress Notes (Signed)
E-Visit for Sinus Problems  We are sorry that you are not feeling well.  Here is how we plan to help!  Based on what you have shared with me it looks like you have sinusitis.  Sinusitis is inflammation and infection in the sinus cavities of the head.  Based on your presentation I believe you most likely have Acute Bacterial Sinusitis.  This is an infection caused by bacteria and is treated with antibiotics. I have prescribed Augmentin 884m/125mg one tablet twice daily with food, for 7 days. Take with food and probiotics. You may use an oral decongestant such as Mucinex D or if you have glaucoma or high blood pressure use plain Mucinex. Saline nasal spray help and can safely be used as often as needed for congestion.  If you develop worsening sinus pain, fever or notice severe headache and vision changes, or if symptoms are not better after completion of antibiotic, please schedule an appointment with a health care provider.    Sinus infections are not as easily transmitted as other respiratory infection, however we still recommend that you avoid close contact with loved ones, especially the very young and elderly.  Remember to wash your hands thoroughly throughout the day as this is the number one way to prevent the spread of infection!  Home Care: Only take medications as instructed by your medical team. Complete the entire course of an antibiotic. Do not take these medications with alcohol. A steam or ultrasonic humidifier can help congestion.  You can place a towel over your head and breathe in the steam from hot water coming from a faucet. Avoid close contacts especially the very young and the elderly. Cover your mouth when you cough or sneeze. Always remember to wash your hands.  Get Help Right Away If: You develop worsening fever or sinus pain. You develop a severe head ache or visual changes. Your symptoms persist after you have completed your treatment plan.  Make sure you Understand  these instructions. Will watch your condition. Will get help right away if you are not doing well or get worse.  Thank you for choosing an e-visit.  Your e-visit answers were reviewed by a board certified advanced clinical practitioner to complete your personal care plan. Depending upon the condition, your plan could have included both over the counter or prescription medications.  Please review your pharmacy choice. Make sure the pharmacy is open so you can pick up prescription now. If there is a problem, you may contact your provider through [employer] have the prescription routed to another pharmacy.  Your safety is important to [place] If you have drug allergies check your prescription carefully.   For the next 24 hours you can use MyChart to ask questions about today's visit, request a non-urgent call back, or ask for a work or school excuse. You will get an email in the next two days asking about your experience. I hope that your e-visit has been valuable and will speed your recovery.    have provided 5 minutes of non face to face time during this encounter for chart review and documentation.

## 2022-11-21 ENCOUNTER — Telehealth: Payer: Self-pay | Admitting: Family Medicine

## 2022-11-21 NOTE — Telephone Encounter (Signed)
Patient would like a call back from someone regarding his most recent Referral. Says it was placed in January and he has not heard back from anyone regarding it.   Please advise and call patient.

## 2023-01-01 ENCOUNTER — Ambulatory Visit: Payer: BC Managed Care – PPO | Admitting: Family Medicine

## 2023-01-01 ENCOUNTER — Encounter: Payer: Self-pay | Admitting: Family Medicine

## 2023-01-01 VITALS — BP 108/73 | HR 96 | Ht 65.0 in | Wt 213.0 lb

## 2023-01-01 DIAGNOSIS — F5101 Primary insomnia: Secondary | ICD-10-CM | POA: Diagnosis not present

## 2023-01-01 DIAGNOSIS — J4541 Moderate persistent asthma with (acute) exacerbation: Secondary | ICD-10-CM

## 2023-01-01 DIAGNOSIS — E291 Testicular hypofunction: Secondary | ICD-10-CM

## 2023-01-01 DIAGNOSIS — E785 Hyperlipidemia, unspecified: Secondary | ICD-10-CM | POA: Diagnosis not present

## 2023-01-01 MED ORDER — ALBUTEROL SULFATE HFA 108 (90 BASE) MCG/ACT IN AERS: 2.0000 | INHALATION_SPRAY | Freq: Four times a day (QID) | RESPIRATORY_TRACT | 2 refills | Status: DC | PRN

## 2023-01-01 MED ORDER — FLUTICASONE FUROATE-VILANTEROL 100-25 MCG/ACT IN AEPB: 1.0000 | INHALATION_SPRAY | Freq: Every day | RESPIRATORY_TRACT | 11 refills | Status: DC

## 2023-01-01 NOTE — Progress Notes (Signed)
BP 108/73   Pulse 96   Ht 5\' 5"  (1.651 m)   Wt 213 lb (96.6 kg)   SpO2 98%   BMI 35.45 kg/m    Subjective:   Patient ID: Chase Stout, male    DOB: January 02, 1981, 43 y.o.   MRN: [id number]  HPI: Chase Stout is a 42 y.o. male presenting on 01/01/2023 for Medical Management of Chronic Issues and Testosterone Defeciency   HPI Hyperlipidemia Patient is coming in for recheck of his hyperlipidemia. The patient is currently taking Crestor. They deny any issues with myalgias or history of liver damage from it. They deny any focal numbness or weakness or chest pain.   Asthma recheck Patient is coming in for asthma recheck.  Currently on Singulair.  He did do well with the Virgel Bouquet helping him but he says he cannot afford it.  He is getting some tightness and congestion currently."  No more cough and chest congestion is been worse over the past 3 to 4 months.  He used to have Breo but the price was too high on his old insurance but now he does feel like he wants to try for it again.  Insomnia recheck Patient is coming in today for insomnia recheck, currently uses mirtazapine.  Patient is currently weaning down off of it and cut it in half and feels like he is doing better family is going to try working down off of it completely.  Patient has a history of testosterone deficiency and has been on testosterone at a young age, endocrinology did not want to take his referral, will do urology referral.  Patient with his bowels where he had some blood in his stool and had some irritable bowels with when he eats he has to go quickly.  He has not been taking his Protonix consistently because he just uses as needed.  He had 2 episodes where he had some blood in his stool a couple weeks ago and then before that.  Relevant past medical, surgical, family and social history reviewed and updated as indicated. Interim medical history since our last visit reviewed. Allergies and medications reviewed and  updated.  Review of Systems  Constitutional:  Negative for chills and fever.  Eyes:  Negative for visual disturbance.  Respiratory:  Negative for shortness of breath and wheezing.   Cardiovascular:  Negative for chest pain and leg swelling.  Gastrointestinal:  Positive for blood in stool and diarrhea. Negative for nausea and vomiting.  Musculoskeletal:  Negative for back pain and gait problem.  Skin:  Negative for rash.  All other systems reviewed and are negative.   Per HPI unless specifically indicated above   Allergies as of 01/01/2023   No Known Allergies      Medication List        Accurate as of January 01, 2023 11:41 AM. If you have any questions, ask your nurse or doctor.          STOP taking these medications    amoxicillin-clavulanate 875-125 MG tablet Commonly known as: AUGMENTIN Stopped by: Elige Radon Joandy Burget, MD   anastrozole 1 MG tablet Commonly known as: ARIMIDEX Stopped by: Elige Radon Billiejean Schimek, MD       TAKE these medications    albuterol 108 (90 Base) MCG/ACT inhaler Commonly known as: VENTOLIN HFA Inhale 2 puffs into the lungs every 6 (six) hours as needed for wheezing or shortness of breath. Started by: Nils Pyle, MD   fluticasone furoate-vilanterol 100-25 MCG/ACT  Aepb Commonly known as: BREO ELLIPTA Inhale 1 puff into the lungs daily. Started by: Nils Pyle, MD   meloxicam 15 MG tablet Commonly known as: MOBIC TAKE 1 TABLET BY MOUTH EVERY DAY   mirtazapine 15 MG tablet Commonly known as: REMERON Take 1 tablet (15 mg total) by mouth at bedtime.   montelukast 10 MG tablet Commonly known as: SINGULAIR Take 1 tablet (10 mg total) by mouth at bedtime.   pantoprazole 40 MG tablet Commonly known as: PROTONIX Take 1 tablet (40 mg total) by mouth daily.   rosuvastatin 5 MG tablet Commonly known as: Crestor Take 1 tablet (5 mg total) by mouth at bedtime.   TESTOSTERONE IM         Objective:   BP 108/73    Pulse 96   Ht 5\' 5"  (1.651 m)   Wt 213 lb (96.6 kg)   SpO2 98%   BMI 35.45 kg/m   Wt Readings from Last 3 Encounters:  01/01/23 213 lb (96.6 kg)  09/29/22 219 lb (99.3 kg)  06/14/22 209 lb (94.8 kg)    Physical Exam Vitals and nursing note reviewed.  Constitutional:      General: He is not in acute distress.    Appearance: He is well-developed. He is not diaphoretic.  Eyes:     General: No scleral icterus.    Conjunctiva/sclera: Conjunctivae normal.  Neck:     Thyroid: No thyromegaly.  Cardiovascular:     Rate and Rhythm: Normal rate and regular rhythm.     Heart sounds: Normal heart sounds. No murmur heard. Pulmonary:     Effort: Pulmonary effort is normal. No respiratory distress.     Breath sounds: Normal breath sounds. No wheezing or rhonchi.  Musculoskeletal:        General: No swelling. Normal range of motion.     Cervical back: Neck supple.  Lymphadenopathy:     Cervical: No cervical adenopathy.  Skin:    General: Skin is warm and dry.     Findings: No rash.  Neurological:     Mental Status: He is alert and oriented to person, place, and time.     Coordination: Coordination normal.  Psychiatric:        Behavior: Behavior normal.       Assessment & Plan:   Problem List Items Addressed This Visit       Respiratory   Asthma   Relevant Medications   fluticasone furoate-vilanterol (BREO ELLIPTA) 100-25 MCG/ACT AEPB   albuterol (VENTOLIN HFA) 108 (90 Base) MCG/ACT inhaler   Other Relevant Orders   CBC with Differential/Platelet   CMP14+EGFR   Lipid panel   TSH     Other   Insomnia   Relevant Orders   CBC with Differential/Platelet   CMP14+EGFR   Lipid panel   TSH   Dyslipidemia - Primary   Relevant Orders   CBC with Differential/Platelet   CMP14+EGFR   Lipid panel   TSH   Other Visit Diagnoses     Testosterone deficiency in male       Relevant Orders   Ambulatory referral to Urology   CBC with Differential/Platelet   CMP14+EGFR    Lipid panel   TSH       Have him take his Protonix every day for the next month, if he continues to have bleeding or continue to have episodes of will do GI referral for him.  Restart Breo and albuterol as needed for asthma.  Also recommended that he  can take Benadryl at nighttime.   Follow up plan: Return in about 6 months (around 07/03/2023), or if symptoms worsen or fail to improve, for Dyslipidemia and asthma recheck.  Counseling provided for all of the vaccine components Orders Placed This Encounter  Procedures   CBC with Differential/Platelet   CMP14+EGFR   Lipid panel   TSH   Ambulatory referral to Urology    Arville Care, MD [facility] 01/01/2023, 11:41 AM

## 2023-01-22 ENCOUNTER — Telehealth: Payer: BC Managed Care – PPO | Admitting: Family Medicine

## 2023-01-22 DIAGNOSIS — K649 Unspecified hemorrhoids: Secondary | ICD-10-CM | POA: Diagnosis not present

## 2023-01-22 MED ORDER — HYDROCORTISONE ACETATE 25 MG RE SUPP: 25.0000 mg | Freq: Two times a day (BID) | RECTAL | 0 refills | Status: DC

## 2023-01-22 NOTE — Progress Notes (Signed)

## 2023-01-24 ENCOUNTER — Telehealth: Payer: Self-pay | Admitting: Family Medicine

## 2023-01-24 NOTE — Telephone Encounter (Signed)
Pt is having rectal pain and bleeding. He has tried Dr. Darrol Poke recommendations for one month and they have not helped. Pt requesting an appt asap. Not sure if he has a hemorrhoid or not. Will need GI referral.  Appt scheduled for 5/23 at 2:50pm

## 2023-01-25 ENCOUNTER — Ambulatory Visit: Payer: BC Managed Care – PPO | Admitting: Family Medicine

## 2023-01-25 VITALS — BP 118/74 | HR 74 | Temp 98.1°F | Ht 65.0 in | Wt 212.4 lb

## 2023-01-25 DIAGNOSIS — R195 Other fecal abnormalities: Secondary | ICD-10-CM

## 2023-01-25 DIAGNOSIS — K649 Unspecified hemorrhoids: Secondary | ICD-10-CM | POA: Diagnosis not present

## 2023-01-25 DIAGNOSIS — R197 Diarrhea, unspecified: Secondary | ICD-10-CM | POA: Diagnosis not present

## 2023-01-25 NOTE — Progress Notes (Signed)
BP 118/74   Pulse 74   Temp 98.1 F (36.7 C) (Temporal)   Ht 5\' 5"  (1.651 m)   Wt 212 lb 6.4 oz (96.3 kg)   SpO2 96%   BMI 35.35 kg/m    Subjective:   Patient ID: Chase Stout, male    DOB: 11-28-1980, 42 y.o.   MRN: [id number]  HPI: Chase Stout is a 42 y.o. male presenting on 01/25/2023 for Rectal Bleeding (Seen before coming back. Protonix is not helping much. It has gotten worse)   HPI Patient comes in today complaining of dark stools rectal pain and rectal bleeding.  He has been fighting this for some time and has been using over-the-counter hemorrhoid creams but they do seem to help a little bit but are not resolving the issue.  Has been getting intermittent diarrhea that is noticed some dark stools recently and also some bright red stools as well.  He denies any abdominal pain or nausea or vomiting.  Relevant past medical, surgical, family and social history reviewed and updated as indicated. Interim medical history since our last visit reviewed. Allergies and medications reviewed and updated.  Review of Systems  Constitutional:  Negative for chills and fever.  Respiratory:  Negative for shortness of breath and wheezing.   Cardiovascular:  Negative for chest pain and leg swelling.  Gastrointestinal:  Positive for anal bleeding, blood in stool, diarrhea and rectal pain. Negative for nausea and vomiting.  Musculoskeletal:  Negative for back pain and gait problem.  Skin:  Negative for rash.  All other systems reviewed and are negative.   Per HPI unless specifically indicated above   Allergies as of 01/25/2023       Reactions   Doxycycline Itching        Medication List        Accurate as of Jan 25, 2023  2:54 PM. If you have any questions, ask your nurse or doctor.          albuterol 108 (90 Base) MCG/ACT inhaler Commonly known as: VENTOLIN HFA Inhale 2 puffs into the lungs every 6 (six) hours as needed for wheezing or shortness of breath.    fluticasone furoate-vilanterol 100-25 MCG/ACT Aepb Commonly known as: BREO ELLIPTA Inhale 1 puff into the lungs daily.   hydrocortisone 25 MG suppository Commonly known as: ANUSOL-HC Place 1 suppository (25 mg total) rectally 2 (two) times daily.   meloxicam 15 MG tablet Commonly known as: MOBIC TAKE 1 TABLET BY MOUTH EVERY DAY   mirtazapine 15 MG tablet Commonly known as: REMERON Take 1 tablet (15 mg total) by mouth at bedtime.   montelukast 10 MG tablet Commonly known as: SINGULAIR Take 1 tablet (10 mg total) by mouth at bedtime.   pantoprazole 40 MG tablet Commonly known as: PROTONIX Take 1 tablet (40 mg total) by mouth daily.   rosuvastatin 5 MG tablet Commonly known as: Crestor Take 1 tablet (5 mg total) by mouth at bedtime.   TESTOSTERONE IM         Objective:   BP 118/74   Pulse 74   Temp 98.1 F (36.7 C) (Temporal)   Ht 5\' 5"  (1.651 m)   Wt 212 lb 6.4 oz (96.3 kg)   SpO2 96%   BMI 35.35 kg/m   Wt Readings from Last 3 Encounters:  01/25/23 212 lb 6.4 oz (96.3 kg)  01/01/23 213 lb (96.6 kg)  09/29/22 219 lb (99.3 kg)    Physical Exam Vitals and nursing note  reviewed.  Constitutional:      General: He is not in acute distress.    Appearance: He is well-developed. He is not diaphoretic.  Eyes:     General: No scleral icterus.    Conjunctiva/sclera: Conjunctivae normal.  Neck:     Thyroid: No thyromegaly.  Genitourinary:    Rectum: Tenderness and external hemorrhoid present. No mass or anal fissure.  Musculoskeletal:     Cervical back: Neck supple.  Lymphadenopathy:     Cervical: No cervical adenopathy.  Skin:    General: Skin is warm and dry.     Findings: No rash.  Neurological:     Mental Status: He is alert and oriented to person, place, and time.     Coordination: Coordination normal.  Psychiatric:        Behavior: Behavior normal.       Assessment & Plan:   Problem List Items Addressed This Visit   None Visit Diagnoses      Intermittent diarrhea    -  Primary   Relevant Orders   Ambulatory referral to Gastroenterology   Hemorrhoids, unspecified hemorrhoid type       Relevant Orders   Ambulatory referral to Gastroenterology   Dark stools       Relevant Orders   Ambulatory referral to Gastroenterology       Will refer to gastroenterology because of continued symptoms especially with bowel issues the likely culprit for his hemorrhoid issues.  Especially with him having dark stools Follow up plan: Return if symptoms worsen or fail to improve.  Counseling provided for all of the vaccine components Orders Placed This Encounter  Procedures   Ambulatory referral to Gastroenterology    Arville Care, MD [facility] 01/25/2023, 2:54 PM

## 2023-01-30 ENCOUNTER — Other Ambulatory Visit: Payer: BC Managed Care – PPO

## 2023-02-05 ENCOUNTER — Other Ambulatory Visit: Payer: BC Managed Care – PPO

## 2023-02-07 ENCOUNTER — Other Ambulatory Visit: Payer: BC Managed Care – PPO

## 2023-02-08 LAB — CMP14+EGFR
ALT: 21 IU/L (ref 0–44)
AST: 20 IU/L (ref 0–40)
Albumin/Globulin Ratio: 2 (ref 1.2–2.2)
Albumin: 4.6 g/dL (ref 4.1–5.1)
Alkaline Phosphatase: 51 IU/L (ref 44–121)
BUN/Creatinine Ratio: 8 — ABNORMAL LOW (ref 9–20)
BUN: 9 mg/dL (ref 6–24)
Bilirubin Total: 0.7 mg/dL (ref 0.0–1.2)
CO2: 20 mmol/L (ref 20–29)
Calcium: 9.3 mg/dL (ref 8.7–10.2)
Chloride: 105 mmol/L (ref 96–106)
Creatinine, Ser: 1.08 mg/dL (ref 0.76–1.27)
Globulin, Total: 2.3 g/dL (ref 1.5–4.5)
Glucose: 81 mg/dL (ref 70–99)
Potassium: 4.6 mmol/L (ref 3.5–5.2)
Sodium: 141 mmol/L (ref 134–144)
Total Protein: 6.9 g/dL (ref 6.0–8.5)
eGFR: 87 mL/min/{1.73_m2} (ref >59)

## 2023-02-08 LAB — CBC WITH DIFFERENTIAL/PLATELET
Basophils Absolute: 0.1 10*3/uL (ref 0.0–0.2)
Basos: 1 %
EOS (ABSOLUTE): 0.3 10*3/uL (ref 0.0–0.4)
Eos: 3 %
Hematocrit: 49.8 % (ref 37.5–51.0)
Hemoglobin: 17.1 g/dL (ref 13.0–17.7)
Immature Grans (Abs): 0.1 10*3/uL (ref 0.0–0.1)
Immature Granulocytes: 1 %
Lymphocytes Absolute: 2 10*3/uL (ref 0.7–3.1)
Lymphs: 21 %
MCH: 30.8 pg (ref 26.6–33.0)
MCHC: 34.3 g/dL (ref 31.5–35.7)
MCV: 90 fL (ref 79–97)
Monocytes Absolute: 0.8 10*3/uL (ref 0.1–0.9)
Monocytes: 8 %
Neutrophils Absolute: 6.1 10*3/uL (ref 1.4–7.0)
Neutrophils: 66 %
Platelets: 277 10*3/uL (ref 150–450)
RBC: 5.55 x10E6/uL (ref 4.14–5.80)
RDW: 12.1 % (ref 11.6–15.4)
WBC: 9.4 10*3/uL (ref 3.4–10.8)

## 2023-02-08 LAB — LIPID PANEL
Chol/HDL Ratio: 3.9 ratio (ref 0.0–5.0)
Cholesterol, Total: 149 mg/dL (ref 100–199)
HDL: 38 mg/dL — ABNORMAL LOW (ref >39)
LDL Chol Calc (NIH): 90 mg/dL (ref 0–99)
Triglycerides: 112 mg/dL (ref 0–149)
VLDL Cholesterol Cal: 21 mg/dL (ref 5–40)

## 2023-02-08 LAB — TSH: TSH: 2.19 u[IU]/mL (ref 0.450–4.500)

## 2023-03-22 ENCOUNTER — Other Ambulatory Visit: Payer: Self-pay | Admitting: Family Medicine

## 2023-04-27 ENCOUNTER — Ambulatory Visit: Payer: BC Managed Care – PPO | Admitting: Gastroenterology

## 2023-05-08 LAB — HM COLONOSCOPY

## 2023-06-25 ENCOUNTER — Other Ambulatory Visit: Payer: Self-pay | Admitting: Family Medicine

## 2023-06-25 ENCOUNTER — Telehealth: Payer: BC Managed Care – PPO | Admitting: Physician Assistant

## 2023-06-25 DIAGNOSIS — B9689 Other specified bacterial agents as the cause of diseases classified elsewhere: Secondary | ICD-10-CM | POA: Diagnosis not present

## 2023-06-25 DIAGNOSIS — J019 Acute sinusitis, unspecified: Secondary | ICD-10-CM | POA: Diagnosis not present

## 2023-06-25 MED ORDER — AMOXICILLIN-POT CLAVULANATE 875-125 MG PO TABS
1.0000 | ORAL_TABLET | Freq: Two times a day (BID) | ORAL | 0 refills | Status: DC
Start: 2023-06-25 — End: 2024-04-14

## 2023-06-25 NOTE — Progress Notes (Signed)

## 2023-06-29 ENCOUNTER — Ambulatory Visit: Payer: BC Managed Care – PPO | Admitting: Family Medicine

## 2023-06-29 ENCOUNTER — Encounter: Payer: Self-pay | Admitting: Family Medicine

## 2023-06-29 VITALS — BP 109/70 | HR 83 | Ht 65.0 in | Wt 212.0 lb

## 2023-06-29 DIAGNOSIS — J4541 Moderate persistent asthma with (acute) exacerbation: Secondary | ICD-10-CM

## 2023-06-29 MED ORDER — PANTOPRAZOLE SODIUM 40 MG PO TBEC: 40.0000 mg | DELAYED_RELEASE_TABLET | Freq: Every day | ORAL | 3 refills | Status: DC

## 2023-06-29 MED ORDER — PREDNISONE 20 MG PO TABS
ORAL_TABLET | ORAL | 0 refills | Status: DC
Start: 2023-06-29 — End: 2023-07-09

## 2023-06-29 MED ORDER — ALBUTEROL SULFATE (2.5 MG/3ML) 0.083% IN NEBU: 2.5000 mg | INHALATION_SOLUTION | Freq: Four times a day (QID) | RESPIRATORY_TRACT | 1 refills | Status: DC | PRN

## 2023-06-29 MED ORDER — MONTELUKAST SODIUM 10 MG PO TABS
10.0000 mg | ORAL_TABLET | Freq: Every day | ORAL | 3 refills | Status: DC
Start: 2023-06-29 — End: 2024-04-14

## 2023-06-29 MED ORDER — ROSUVASTATIN CALCIUM 5 MG PO TABS: 5.0000 mg | ORAL_TABLET | Freq: Every day | ORAL | 1 refills | Status: DC

## 2023-06-29 MED ORDER — BUDESONIDE-FORMOTEROL FUMARATE 160-4.5 MCG/ACT IN AERO
2.0000 | INHALATION_SPRAY | Freq: Two times a day (BID) | RESPIRATORY_TRACT | 3 refills | Status: DC
Start: 2023-06-29 — End: 2023-11-13

## 2023-06-29 NOTE — Progress Notes (Signed)
BP 109/70   Pulse 83   Ht 5\' 5"  (1.651 m)   Wt 212 lb (96.2 kg)   SpO2 96%   BMI 35.28 kg/m    Subjective:   Patient ID: Chase Stout, male    DOB: 1981-06-15, 42 y.o.   MRN: [id number]  HPI: Chase Stout is a 42 y.o. male presenting on 06/29/2023 for Asthma  Patient presents today for worsening asthma. He reports his asthma has progressively worsened over the past year where he constantly feels chest tightness and the urge to cough. He was started on Breo-Ellipta a few months ago, but he cannot really tell any difference in his symptoms. He is compliant with the current medication regimen, which includes Breo-Ellipta, Singulair, albuterol PRN, and Claritin. He limits the albuterol inhaler use to before strenuous activity, which averages about every other day. Despite using the inhaler before exercise, he still audibly wheezes with activity. His symptoms are mainly bothersome throughout the daytime as he does not wake up frequently during the night. He was recently diagnosed with a sinus infection and prescribed antibiotics, but his asthma was worsening before this infection.   Relevant past medical, surgical, family and social history reviewed and updated as indicated. Interim medical history since our last visit reviewed. Allergies and medications reviewed and updated.  Review of Systems  Constitutional:  Negative for chills and fever.  HENT:  Positive for congestion and sinus pressure. Negative for ear discharge and ear pain.   Eyes:  Negative for pain.  Respiratory:  Positive for cough, chest tightness and wheezing.   Cardiovascular:  Negative for chest pain.  Allergic/Immunologic: Positive for environmental allergies.   Per HPI unless specifically indicated above   Allergies as of 06/29/2023       Reactions   Doxycycline Itching   Covid-19 Mrna Vaccine (pfizer) [covid-19 Mrna Vacc (moderna)] Swelling        Medication List        Accurate as of June 29, 2023  3:56 PM. If you have any questions, ask your nurse or doctor.          STOP taking these medications    fluticasone furoate-vilanterol 100-25 MCG/ACT Aepb Commonly known as: BREO ELLIPTA Stopped by: Elige Radon Alvaretta Eisenberger   hydrocortisone 25 MG suppository Commonly known as: ANUSOL-HC Stopped by: Elige Radon Irlene Crudup       TAKE these medications    albuterol 108 (90 Base) MCG/ACT inhaler Commonly known as: VENTOLIN HFA Inhale 2 puffs into the lungs every 6 (six) hours as needed for wheezing or shortness of breath. What changed: Another medication with the same name was added. Make sure you understand how and when to take each. Changed by: Elige Radon Kizzy Olafson   albuterol (2.5 MG/3ML) 0.083% nebulizer solution Commonly known as: PROVENTIL Take 3 mLs (2.5 mg total) by nebulization every 6 (six) hours as needed for wheezing or shortness of breath. What changed: You were already taking a medication with the same name, and this prescription was added. Make sure you understand how and when to take each. Changed by: Elige Radon Dafina Suk   amoxicillin-clavulanate 875-125 MG tablet Commonly known as: AUGMENTIN Take 1 tablet by mouth 2 (two) times daily.   budesonide-formoterol 160-4.5 MCG/ACT inhaler Commonly known as: SYMBICORT Inhale 2 puffs into the lungs 2 (two) times daily. Started by: Elige Radon Grayland Daisey   meloxicam 15 MG tablet Commonly known as: MOBIC TAKE 1 TABLET BY MOUTH EVERY DAY   mirtazapine 15 MG tablet  Commonly known as: REMERON Take 1 tablet (15 mg total) by mouth at bedtime.   montelukast 10 MG tablet Commonly known as: SINGULAIR Take 1 tablet (10 mg total) by mouth at bedtime.   pantoprazole 40 MG tablet Commonly known as: PROTONIX Take 1 tablet (40 mg total) by mouth daily.   predniSONE 20 MG tablet Commonly known as: DELTASONE 2 po at same time daily for 5 days Started by: Elige Radon Damarion Mendizabal   rosuvastatin 5 MG tablet Commonly known as:  CRESTOR Take 1 tablet (5 mg total) by mouth at bedtime. What changed: additional instructions Changed by: Elige Radon Almer Bushey   TESTOSTERONE IM               Durable Medical Equipment  (From admission, onward)           Start     Ordered   06/29/23 0000  For home use only DME Nebulizer machine       Question Answer Comment  Patient needs a nebulizer to treat with the following condition Asthma   Length of Need Lifetime      06/29/23 1337             Objective:   BP 109/70   Pulse 83   Ht 5\' 5"  (1.651 m)   Wt 212 lb (96.2 kg)   SpO2 96%   BMI 35.28 kg/m   Wt Readings from Last 3 Encounters:  06/29/23 212 lb (96.2 kg)  01/25/23 212 lb 6.4 oz (96.3 kg)  01/01/23 213 lb (96.6 kg)    Physical Exam Vitals and nursing note reviewed.  Constitutional:      General: He is not in acute distress.    Appearance: Normal appearance.  HENT:     Head: Normocephalic and atraumatic.     Right Ear: Tympanic membrane, ear canal and external ear normal.     Left Ear: Tympanic membrane, ear canal and external ear normal.     Nose: Congestion present.     Mouth/Throat:     Mouth: Mucous membranes are moist.     Pharynx: Oropharynx is clear.  Eyes:     Conjunctiva/sclera: Conjunctivae normal.  Neck:     Comments: Submental lymphadenopathy, mobile, smooth and firm, small diameter Cardiovascular:     Rate and Rhythm: Normal rate and regular rhythm.     Heart sounds: Normal heart sounds.  Pulmonary:     Effort: Pulmonary effort is normal. No respiratory distress.     Breath sounds: Normal breath sounds.     Comments: Coughing with deep breaths Musculoskeletal:     Cervical back: Normal range of motion and neck supple.  Skin:    General: Skin is warm and dry.  Neurological:     Mental Status: He is alert and oriented to person, place, and time.  Psychiatric:        Mood and Affect: Mood normal.        Behavior: Behavior normal.    Assessment & Plan:   Problem  List Items Addressed This Visit       Respiratory   Asthma - Primary   Relevant Medications   montelukast (SINGULAIR) 10 MG tablet   predniSONE (DELTASONE) 20 MG tablet   albuterol (PROVENTIL) (2.5 MG/3ML) 0.083% nebulizer solution   budesonide-formoterol (SYMBICORT) 160-4.5 MCG/ACT inhaler   Other Relevant Orders   For home use only DME Nebulizer machine    Since patient's asthma symptoms have not improved since starting Breo-Ellipta, we will change maintenance  inhaler to Symbicort. Patient was given a spacer to use with both the Symbicort and albuterol inhalers to help delivery of medications to lungs. Will also prescribe a short course of prednisone to help reduce the current airway inflammation while the inhaler regimen is modified. Provided patient with prescription for nebulizer since he has benefited from breathing treatments in the past.  Follow up plan: Return if symptoms worsen or fail to improve, for 1 to 45-month recheck asthma and physical exam.  Counseling provided for all of the vaccine components Orders Placed This Encounter  Procedures   For home use only DME Nebulizer machine    Gillermina Phy, Medical Student [facility] 06/29/2023, 3:56 PM  Patient seen and examined with Gillermina Phy, medical student, agree with assessment and plan above.  Switch to Symbicort and see if that is little better for him, also gave him a sample of a spacer so that he could get better delivery of medication also did a prescription for a nebulizer.  If he still having issues then may consider allergy and asthma referral in the future. Arville Care, MD [facility] 06/29/2023, 3:57 PM

## 2023-07-08 ENCOUNTER — Encounter: Payer: Self-pay | Admitting: Family Medicine

## 2023-07-09 MED ORDER — DEXAMETHASONE 2 MG PO TABS: ORAL_TABLET | ORAL | 0 refills | Status: DC

## 2023-07-26 ENCOUNTER — Other Ambulatory Visit: Payer: Self-pay | Admitting: Family Medicine

## 2023-08-30 ENCOUNTER — Other Ambulatory Visit: Payer: Self-pay | Admitting: Orthopedic Surgery

## 2023-08-30 DIAGNOSIS — M7711 Lateral epicondylitis, right elbow: Secondary | ICD-10-CM

## 2023-08-30 DIAGNOSIS — M25521 Pain in right elbow: Secondary | ICD-10-CM

## 2023-08-31 ENCOUNTER — Ambulatory Visit
Admission: RE | Admit: 2023-08-31 | Discharge: 2023-08-31 | Disposition: A | Discharge status: Home / Self Care | Payer: BC Managed Care – PPO | Source: Ambulatory Visit | Attending: Orthopedic Surgery | Admitting: Orthopedic Surgery

## 2023-08-31 DIAGNOSIS — M25521 Pain in right elbow: Secondary | ICD-10-CM

## 2023-08-31 DIAGNOSIS — M7711 Lateral epicondylitis, right elbow: Secondary | ICD-10-CM

## 2023-11-13 ENCOUNTER — Other Ambulatory Visit: Payer: Self-pay | Admitting: Family Medicine

## 2023-11-13 DIAGNOSIS — J4541 Moderate persistent asthma with (acute) exacerbation: Secondary | ICD-10-CM

## 2023-12-17 ENCOUNTER — Other Ambulatory Visit: Payer: Self-pay | Admitting: Family Medicine

## 2023-12-17 DIAGNOSIS — J4541 Moderate persistent asthma with (acute) exacerbation: Secondary | ICD-10-CM

## 2024-01-18 ENCOUNTER — Other Ambulatory Visit: Payer: Self-pay | Admitting: Family Medicine

## 2024-01-18 DIAGNOSIS — J4541 Moderate persistent asthma with (acute) exacerbation: Secondary | ICD-10-CM

## 2024-01-18 NOTE — Telephone Encounter (Signed)
 [place] pt NTBS 30-d given 12/17/23

## 2024-01-21 ENCOUNTER — Encounter: Payer: Self-pay | Admitting: Family Medicine

## 2024-01-21 NOTE — Telephone Encounter (Signed)
 LMTCB to schedule appt Letter mailed

## 2024-02-08 ENCOUNTER — Ambulatory Visit: Payer: Self-pay | Admitting: Family Medicine

## 2024-02-08 VITALS — BP 120/84 | HR 84 | Ht 65.0 in | Wt 209.0 lb

## 2024-02-08 DIAGNOSIS — J4540 Moderate persistent asthma, uncomplicated: Secondary | ICD-10-CM | POA: Diagnosis not present

## 2024-02-08 MED ORDER — BUDESONIDE-FORMOTEROL FUMARATE 160-4.5 MCG/ACT IN AERO: 2.0000 | INHALATION_SPRAY | Freq: Two times a day (BID) | RESPIRATORY_TRACT | 2 refills | Status: DC

## 2024-02-08 MED ORDER — ALBUTEROL SULFATE (2.5 MG/3ML) 0.083% IN NEBU: 2.5000 mg | INHALATION_SOLUTION | Freq: Four times a day (QID) | RESPIRATORY_TRACT | 2 refills | Status: AC | PRN

## 2024-02-08 MED ORDER — ALBUTEROL SULFATE HFA 108 (90 BASE) MCG/ACT IN AERS: 2.0000 | INHALATION_SPRAY | Freq: Four times a day (QID) | RESPIRATORY_TRACT | 2 refills | Status: DC | PRN

## 2024-02-08 NOTE — Progress Notes (Signed)
 BP 120/84   Pulse 84   Ht 5\' 5"  (1.651 m)   Wt 209 lb (94.8 kg)   SpO2 97%   BMI 34.78 kg/m    Subjective:   Patient ID: Chase Stout, male    DOB: April 08, 1981, 43 y.o.   MRN: [id number]  HPI: Chase Stout is a 43 y.o. male presenting on 02/08/2024 for Medical Management of Chronic Issues and Asthma   HPI Asthma recheck Patient is currently using symbicort  and albuterol .  He uses albuterol  less than once per week and feels like it is dong well for him.  He has occasional issues with wheezing and hoarseness.    Relevant past medical, surgical, family and social history reviewed and updated as indicated. Interim medical history since our last visit reviewed. Allergies and medications reviewed and updated.  Review of Systems  Constitutional:  Negative for chills and fever.  HENT:  Positive for congestion.   Eyes:  Negative for visual disturbance.  Respiratory:  Positive for cough and wheezing. Negative for shortness of breath.   Cardiovascular:  Negative for chest pain and leg swelling.  Musculoskeletal:  Negative for back pain and gait problem.  Skin:  Negative for rash.  Neurological:  Negative for dizziness, weakness and light-headedness.  All other systems reviewed and are negative.   Per HPI unless specifically indicated above   Allergies as of 02/08/2024       Reactions   Doxycycline  Itching   Covid-19 Mrna Vaccine (pfizer) [covid-19 Mrna Vacc (moderna)] Swelling        Medication List        Accurate as of February 08, 2024  1:13 PM. If you have any questions, ask your nurse or doctor.          albuterol  (2.5 MG/3ML) 0.083% nebulizer solution Commonly known as: PROVENTIL  Take 3 mLs (2.5 mg total) by nebulization every 6 (six) hours as needed for wheezing or shortness of breath.   albuterol  108 (90 Base) MCG/ACT inhaler Commonly known as: VENTOLIN  HFA Inhale 2 puffs into the lungs every 6 (six) hours as needed for wheezing or shortness of breath.    amoxicillin -clavulanate 875-125 MG tablet Commonly known as: AUGMENTIN  Take 1 tablet by mouth 2 (two) times daily.   budesonide -formoterol  160-4.5 MCG/ACT inhaler Commonly known as: SYMBICORT  Inhale 2 puffs into the lungs 2 (two) times daily. What changed: additional instructions Changed by: Lucio Sabin Synda Bagent   dexamethasone  2 MG tablet Commonly known as: DECADRON  Take 4 tablets for 3 days then 3 tablets for 3 days then 2 tablets for 3 days then 1 tablet for 3 days   meloxicam  15 MG tablet Commonly known as: MOBIC  TAKE 1 TABLET BY MOUTH EVERY DAY   mirtazapine  15 MG tablet Commonly known as: REMERON  TAKE ONE TABLET AT BEDTIME   montelukast  10 MG tablet Commonly known as: SINGULAIR  Take 1 tablet (10 mg total) by mouth at bedtime.   pantoprazole  40 MG tablet Commonly known as: PROTONIX  Take 1 tablet (40 mg total) by mouth daily.   rosuvastatin  5 MG tablet Commonly known as: CRESTOR  Take 1 tablet (5 mg total) by mouth at bedtime.   TESTOSTERONE  IM         Objective:   BP 120/84   Pulse 84   Ht 5\' 5"  (1.651 m)   Wt 209 lb (94.8 kg)   SpO2 97%   BMI 34.78 kg/m   Wt Readings from Last 3 Encounters:  02/08/24 209 lb (94.8 kg)  06/29/23 212 lb (96.2 kg)  01/25/23 212 lb 6.4 oz (96.3 kg)    Physical Exam Vitals and nursing note reviewed.  Constitutional:      General: He is not in acute distress.    Appearance: He is well-developed. He is not diaphoretic.  Eyes:     General: No scleral icterus.    Conjunctiva/sclera: Conjunctivae normal.  Neck:     Thyroid: No thyromegaly.  Cardiovascular:     Rate and Rhythm: Normal rate and regular rhythm.     Heart sounds: Normal heart sounds. No murmur heard. Pulmonary:     Effort: Pulmonary effort is normal. No respiratory distress.     Breath sounds: Normal breath sounds. No wheezing.  Musculoskeletal:        General: No swelling. Normal range of motion.     Cervical back: Neck supple.  Lymphadenopathy:      Cervical: No cervical adenopathy.  Skin:    General: Skin is warm and dry.     Findings: No rash.  Neurological:     Mental Status: He is alert and oriented to person, place, and time.     Coordination: Coordination normal.  Psychiatric:        Behavior: Behavior normal.       Assessment & Plan:   Problem List Items Addressed This Visit       Respiratory   Asthma - Primary   Relevant Medications   albuterol  (PROVENTIL ) (2.5 MG/3ML) 0.083% nebulizer solution   albuterol  (VENTOLIN  HFA) 108 (90 Base) MCG/ACT inhaler   budesonide -formoterol  (SYMBICORT ) 160-4.5 MCG/ACT inhaler    Continue current medications, seems to be doing well.  Follow up plan: Return if symptoms worsen or fail to improve, for due for a physical sometime this year.  Counseling provided for all of the vaccine components No orders of the defined types were placed in this encounter.   Jolyne Needs, MD [facility] 02/08/2024, 1:13 PM

## 2024-03-10 ENCOUNTER — Other Ambulatory Visit: Payer: Self-pay | Admitting: Family Medicine

## 2024-04-14 ENCOUNTER — Encounter: Payer: Self-pay | Admitting: Family Medicine

## 2024-04-14 ENCOUNTER — Ambulatory Visit: Admitting: Family Medicine

## 2024-04-14 VITALS — BP 127/80 | HR 73 | Ht 65.0 in | Wt 203.0 lb

## 2024-04-14 DIAGNOSIS — Z0000 Encounter for general adult medical examination without abnormal findings: Secondary | ICD-10-CM

## 2024-04-14 DIAGNOSIS — K219 Gastro-esophageal reflux disease without esophagitis: Secondary | ICD-10-CM

## 2024-04-14 DIAGNOSIS — J4540 Moderate persistent asthma, uncomplicated: Secondary | ICD-10-CM

## 2024-04-14 DIAGNOSIS — Z0001 Encounter for general adult medical examination with abnormal findings: Secondary | ICD-10-CM

## 2024-04-14 DIAGNOSIS — E785 Hyperlipidemia, unspecified: Secondary | ICD-10-CM | POA: Diagnosis not present

## 2024-04-14 DIAGNOSIS — F5101 Primary insomnia: Secondary | ICD-10-CM

## 2024-04-14 DIAGNOSIS — J4541 Moderate persistent asthma with (acute) exacerbation: Secondary | ICD-10-CM

## 2024-04-14 MED ORDER — PANTOPRAZOLE SODIUM 40 MG PO TBEC
40.0000 mg | DELAYED_RELEASE_TABLET | Freq: Every day | ORAL | 3 refills | Status: AC
Start: 2024-04-14 — End: ?

## 2024-04-14 MED ORDER — MONTELUKAST SODIUM 10 MG PO TABS: 10.0000 mg | ORAL_TABLET | Freq: Every day | ORAL | 3 refills | Status: DC

## 2024-04-14 MED ORDER — MELOXICAM 15 MG PO TABS: 15.0000 mg | ORAL_TABLET | Freq: Every day | ORAL | 2 refills | Status: DC

## 2024-04-14 MED ORDER — ALBUTEROL SULFATE HFA 108 (90 BASE) MCG/ACT IN AERS: 2.0000 | INHALATION_SPRAY | Freq: Four times a day (QID) | RESPIRATORY_TRACT | 2 refills | Status: AC | PRN

## 2024-04-14 MED ORDER — BUDESONIDE-FORMOTEROL FUMARATE 160-4.5 MCG/ACT IN AERO: 2.0000 | INHALATION_SPRAY | Freq: Two times a day (BID) | RESPIRATORY_TRACT | 2 refills | Status: DC

## 2024-04-14 MED ORDER — MIRTAZAPINE 15 MG PO TABS: 15.0000 mg | ORAL_TABLET | Freq: Every day | ORAL | 0 refills | Status: AC

## 2024-04-14 NOTE — Progress Notes (Signed)
 BP 127/80   Pulse 73   Ht 5' 5 (1.651 m)   Wt 203 lb (92.1 kg)   SpO2 97%   BMI 33.78 kg/m    Subjective:   Patient ID: Chase Stout, male    DOB: Jan 14, 1981, 43 y.o.   MRN: [id number]  HPI: Chase Stout is a 43 y.o. male presenting on 04/14/2024 for No chief complaint on file.   Discussed the use of AI scribe software for clinical note transcription with the patient, who gave verbal consent to proceed.  History of Present Illness   Chase Stout is a 43 year old male who presents for a physical exam and recheck of chronic medical issues.  He experiences significant fatigue and muscle soreness while on his cholesterol medication, which he takes at bedtime. When he attempts to restart the medication, the symptoms return within three days.  For gastroesophageal reflux disease (GERD), he takes pantoprazole  as needed, approximately every few weeks, which effectively manages his symptoms after a few days of use.  Regarding asthma and allergies, he continues to take montelukast  and uses albuterol  as needed. He recently needed the albuterol  inhaler due to coughing triggered by heat and physical exertion. This was an isolated incident, and he does not frequently require the inhaler. He also uses Symbicort  daily in the morning.  He takes mirtazapine  for sleep, but only half of the prescribed 15 mg dose, which he finds effective. He uses meloxicam  as needed for left shoulder pain, typically for two to three days at a time, which alleviates his symptoms. He ensures to take it with food to avoid gastrointestinal issues.  He has a history of kidney stones and recent pain on his side, which was exacerbated by a knee injury during training. The pain has since subsided, but he remains uncertain if it was related to his back or kidney.  Socially, he is active, engaging in [employer] six times a month and working out at home due to his 12-hour work shifts. He has lost weight since his last  visit. No recent increase in the need for albuterol , except for one isolated incident.          Relevant past medical, surgical, family and social history reviewed and updated as indicated. Interim medical history since our last visit reviewed. Allergies and medications reviewed and updated.  Review of Systems  Constitutional:  Negative for chills and fever.  HENT:  Negative for ear pain and tinnitus.   Eyes:  Negative for pain and discharge.  Respiratory:  Negative for cough, shortness of breath and wheezing.   Cardiovascular:  Negative for chest pain, palpitations and leg swelling.  Gastrointestinal:  Negative for abdominal pain, blood in stool, constipation and diarrhea.  Genitourinary:  Negative for dysuria and hematuria.  Musculoskeletal:  Negative for back pain, gait problem and myalgias.  Skin:  Negative for rash.  Neurological:  Negative for dizziness, weakness and headaches.  Psychiatric/Behavioral:  Negative for suicidal ideas.   All other systems reviewed and are negative.   Per HPI unless specifically indicated above   Allergies as of 04/14/2024       Reactions   Doxycycline  Itching   Covid-19 Mrna Vaccine (pfizer) [covid-19 Mrna Vacc (moderna)] Swelling        Medication List        Accurate as of April 14, 2024  1:26 PM. If you have any questions, ask your nurse or doctor.  STOP taking these medications    amoxicillin -clavulanate 875-125 MG tablet Commonly known as: AUGMENTIN  Stopped by: Chase Stout Chase Stout   dexamethasone  2 MG tablet Commonly known as: DECADRON  Stopped by: Chase Stout Chase Stout   rosuvastatin  5 MG tablet Commonly known as: CRESTOR  Stopped by: Chase Stout Chase Stout       TAKE these medications    albuterol  (2.5 MG/3ML) 0.083% nebulizer solution Commonly known as: PROVENTIL  Take 3 mLs (2.5 mg total) by nebulization every 6 (six) hours as needed for wheezing or shortness of breath.   albuterol  108 (90 Base) MCG/ACT  inhaler Commonly known as: VENTOLIN  HFA Inhale 2 puffs into the lungs every 6 (six) hours as needed for wheezing or shortness of breath.   budesonide -formoterol  160-4.5 MCG/ACT inhaler Commonly known as: SYMBICORT  Inhale 2 puffs into the lungs 2 (two) times daily.   meloxicam  15 MG tablet Commonly known as: MOBIC  Take 1 tablet (15 mg total) by mouth daily.   mirtazapine  15 MG tablet Commonly known as: REMERON  Take 1 tablet (15 mg total) by mouth at bedtime.   montelukast  10 MG tablet Commonly known as: SINGULAIR  Take 1 tablet (10 mg total) by mouth at bedtime.   pantoprazole  40 MG tablet Commonly known as: PROTONIX  Take 1 tablet (40 mg total) by mouth daily.   TESTOSTERONE  IM         Objective:   BP 127/80   Pulse 73   Ht 5' 5 (1.651 m)   Wt 203 lb (92.1 kg)   SpO2 97%   BMI 33.78 kg/m   Wt Readings from Last 3 Encounters:  04/14/24 203 lb (92.1 kg)  02/08/24 209 lb (94.8 kg)  06/29/23 212 lb (96.2 kg)    Physical Exam Vitals reviewed.  Constitutional:      General: He is not in acute distress.    Appearance: He is well-developed. He is not diaphoretic.  HENT:     Right Ear: External ear normal.     Left Ear: External ear normal.     Nose: Nose normal.     Mouth/Throat:     Pharynx: No oropharyngeal exudate.  Eyes:     General: No scleral icterus.    Conjunctiva/sclera: Conjunctivae normal.  Neck:     Thyroid: No thyromegaly.  Cardiovascular:     Rate and Rhythm: Normal rate and regular rhythm.     Heart sounds: Normal heart sounds. No murmur heard. Pulmonary:     Effort: Pulmonary effort is normal. No respiratory distress.     Breath sounds: Normal breath sounds. No wheezing.  Abdominal:     General: Bowel sounds are normal. There is no distension.     Palpations: Abdomen is soft.     Tenderness: There is no abdominal tenderness. There is no guarding or rebound.  Musculoskeletal:        General: No swelling. Normal range of motion.      Cervical back: Neck supple.  Lymphadenopathy:     Cervical: No cervical adenopathy.  Skin:    General: Skin is warm and dry.     Findings: No rash.  Neurological:     Mental Status: He is alert and oriented to person, place, and time.     Coordination: Coordination normal.  Psychiatric:        Behavior: Behavior normal.    Physical Exam   VITALS: BP- 127/80 HEENT: Ears normal. Throat without signs of infection. CHEST: Lungs clear to auscultation bilaterally. CARDIOVASCULAR: Regular heart rate and rhythm. EXTREMITIES: No  edema in lower extremities.         Assessment & Plan:   Problem List Items Addressed This Visit       Respiratory   Asthma   Relevant Medications   montelukast  (SINGULAIR ) 10 MG tablet   albuterol  (VENTOLIN  HFA) 108 (90 Base) MCG/ACT inhaler   budesonide -formoterol  (SYMBICORT ) 160-4.5 MCG/ACT inhaler     Digestive   GERD (gastroesophageal reflux disease)   Relevant Medications   pantoprazole  (PROTONIX ) 40 MG tablet     Other   Insomnia   Relevant Medications   mirtazapine  (REMERON ) 15 MG tablet   Dyslipidemia   Other Visit Diagnoses       Physical exam    -  Primary   Relevant Orders   CBC with Differential/Platelet   CMP14+EGFR   Lipid panel         Adult Wellness Visit Routine wellness visit with stable chronic conditions. - Perform blood work: cholesterol, kidney, liver, glucose levels.  Dyslipidemia Crestor  caused fatigue and soreness, resolved after discontinuation. - Review blood work for cholesterol levels. - Discuss alternative cholesterol-lowering medications.  Asthma and allergic rhinitis Controlled with montelukast  and albuterol . Symbicort  used daily. - Send refill for albuterol  inhaler.  Gastroesophageal reflux disease (GERD) Pantoprazole  effective when used as needed.  Chronic left shoulder pain Managed with meloxicam , effective when taken for 2-3 days. - Continue meloxicam  as needed with  food.  Insomnia Mirtazapine  effective at half dose for sleep.       Follow up plan: Return in about 6 months (around 10/15/2024), or if symptoms worsen or fail to improve, for Cholesterol recheck.  Counseling provided for all of the vaccine components Orders Placed This Encounter  Procedures   CBC with Differential/Platelet   CMP14+EGFR   Lipid panel    Chase Levins, MD [facility] 04/14/2024, 1:26 PM

## 2024-04-15 LAB — CBC WITH DIFFERENTIAL/PLATELET
Basophils Absolute: 0.1 x10E3/uL (ref 0.0–0.2)
Basos: 1 %
EOS (ABSOLUTE): 0.4 x10E3/uL (ref 0.0–0.4)
Eos: 3 %
Hematocrit: 46.2 % (ref 37.5–51.0)
Hemoglobin: 15.1 g/dL (ref 13.0–17.7)
Immature Grans (Abs): 0.1 x10E3/uL (ref 0.0–0.1)
Immature Granulocytes: 1 %
Lymphocytes Absolute: 2 x10E3/uL (ref 0.7–3.1)
Lymphs: 18 %
MCH: 31 pg (ref 26.6–33.0)
MCHC: 32.7 g/dL (ref 31.5–35.7)
MCV: 95 fL (ref 79–97)
Monocytes Absolute: 0.8 x10E3/uL (ref 0.1–0.9)
Monocytes: 7 %
Neutrophils Absolute: 8.2 x10E3/uL — ABNORMAL HIGH (ref 1.4–7.0)
Neutrophils: 70 %
Platelets: 308 x10E3/uL (ref 150–450)
RBC: 4.87 x10E6/uL (ref 4.14–5.80)
RDW: 12.4 % (ref 11.6–15.4)
WBC: 11.6 x10E3/uL — ABNORMAL HIGH (ref 3.4–10.8)

## 2024-04-15 LAB — CMP14+EGFR
ALT: 45 IU/L — ABNORMAL HIGH (ref 0–44)
AST: 27 IU/L (ref 0–40)
Albumin: 4.3 g/dL (ref 4.1–5.1)
Alkaline Phosphatase: 69 IU/L (ref 44–121)
BUN/Creatinine Ratio: 12 (ref 9–20)
BUN: 12 mg/dL (ref 6–24)
Bilirubin Total: 0.3 mg/dL (ref 0.0–1.2)
CO2: 19 mmol/L — ABNORMAL LOW (ref 20–29)
Calcium: 9.1 mg/dL (ref 8.7–10.2)
Chloride: 104 mmol/L (ref 96–106)
Creatinine, Ser: 1.04 mg/dL (ref 0.76–1.27)
Globulin, Total: 2.5 g/dL (ref 1.5–4.5)
Glucose: 83 mg/dL (ref 70–99)
Potassium: 4.5 mmol/L (ref 3.5–5.2)
Sodium: 138 mmol/L (ref 134–144)
Total Protein: 6.8 g/dL (ref 6.0–8.5)
eGFR: 91 mL/min/1.73 (ref >59)

## 2024-04-15 LAB — LIPID PANEL
Chol/HDL Ratio: 10.3 ratio — ABNORMAL HIGH (ref 0.0–5.0)
Cholesterol, Total: 237 mg/dL — ABNORMAL HIGH (ref 100–199)
HDL: 23 mg/dL — ABNORMAL LOW (ref >39)
LDL Chol Calc (NIH): 181 mg/dL — ABNORMAL HIGH (ref 0–99)
Triglycerides: 174 mg/dL — ABNORMAL HIGH (ref 0–149)
VLDL Cholesterol Cal: 33 mg/dL (ref 5–40)

## 2024-04-18 ENCOUNTER — Ambulatory Visit: Payer: Self-pay | Admitting: Family Medicine

## 2024-04-21 ENCOUNTER — Other Ambulatory Visit: Payer: Self-pay | Admitting: Family Medicine

## 2024-04-21 MED ORDER — EZETIMIBE 10 MG PO TABS
10.0000 mg | ORAL_TABLET | Freq: Every day | ORAL | 3 refills | Status: AC
Start: 2024-04-21 — End: ?

## 2024-06-16 ENCOUNTER — Other Ambulatory Visit: Payer: Self-pay | Admitting: Family Medicine

## 2024-07-14 ENCOUNTER — Other Ambulatory Visit: Payer: Self-pay | Admitting: *Deleted

## 2024-07-14 DIAGNOSIS — J4541 Moderate persistent asthma with (acute) exacerbation: Secondary | ICD-10-CM

## 2024-07-24 ENCOUNTER — Telehealth: Admitting: Physician Assistant

## 2024-07-24 DIAGNOSIS — J329 Chronic sinusitis, unspecified: Secondary | ICD-10-CM | POA: Diagnosis not present

## 2024-07-24 MED ORDER — AMOXICILLIN-POT CLAVULANATE 875-125 MG PO TABS: 1.0000 | ORAL_TABLET | Freq: Two times a day (BID) | ORAL | 0 refills | Status: AC

## 2024-07-24 MED ORDER — LEVOCETIRIZINE DIHYDROCHLORIDE 5 MG PO TABS: 5.0000 mg | ORAL_TABLET | Freq: Every evening | ORAL | 0 refills | Status: AC

## 2024-07-24 MED ORDER — FLUTICASONE PROPIONATE 50 MCG/ACT NA SUSP: 2.0000 | Freq: Every day | NASAL | 6 refills | Status: AC

## 2024-07-24 NOTE — Progress Notes (Signed)
 E-Visit for Sinus Problems  We are sorry that you are not feeling well.  Here is how we plan to help!  Based on what you have shared with me it looks like you have sinusitis.  Sinusitis is inflammation and infection in the sinus cavities of the head.  Based on your presentation I believe you most likely have Acute Bacterial Sinusitis.  This is an infection caused by bacteria and is treated with antibiotics. I have prescribed Augmentin  875mg /125mg  one tablet twice daily with food, for 7 days., I have also prescribed Flonase  Nasal Spray Use 2 sprays in each nostril daily for 10-14 days, and Levocetirizine 10mg  Take 1 tablet at bedtime You may use an oral decongestant such as Mucinex D or if you have glaucoma or high blood pressure use plain Mucinex. Saline nasal spray help and can safely be used as often as needed for congestion.  If you develop worsening sinus pain, fever or notice severe headache and vision changes, or if symptoms are not better after completion of antibiotic, please schedule an appointment with a health care provider.    Sinus infections are not as easily transmitted as other respiratory infection, however we still recommend that you avoid close contact with loved ones, especially the very young and elderly.  Remember to wash your hands thoroughly throughout the day as this is the number one way to prevent the spread of infection!  Home Care: Only take medications as instructed by your medical team. Complete the entire course of an antibiotic. Do not take these medications with alcohol. A steam or ultrasonic humidifier can help congestion.  You can place a towel over your head and breathe in the steam from hot water coming from a faucet. Avoid close contacts especially the very young and the elderly. Cover your mouth when you cough or sneeze. Always remember to wash your hands.  Get Help Right Away If: You develop worsening fever or sinus pain. You develop a severe head ache  or visual changes. Your symptoms persist after you have completed your treatment plan.  Make sure you Understand these instructions. Will watch your condition. Will get help right away if you are not doing well or get worse.  Your e-visit answers were reviewed by a board certified advanced clinical practitioner to complete your personal care plan.  Depending on the condition, your plan could have included both over the counter or prescription medications.  If there is a problem please reply  once you have received a response from your provider.  Your safety is important to us .  If you have drug allergies check your prescription carefully.    You can use MyChart to ask questions about today's visit, request a non-urgent call back, or ask for a work or school excuse for 24 hours related to this e-Visit. If it has been greater than 24 hours you will need to follow up with your provider, or enter a new e-Visit to address those concerns.  You will get an e-mail in the next two days asking about your experience.  I hope that your e-visit has been valuable and will speed your recovery. Thank you for using e-visits.  I have spent 5 minutes in review of e-visit questionnaire, review and updating patient chart, medical decision making and response to patient.   Teena Shuck, PA-C

## 2024-07-25 ENCOUNTER — Other Ambulatory Visit: Payer: Self-pay | Admitting: Family Medicine

## 2024-08-05 ENCOUNTER — Other Ambulatory Visit: Payer: Self-pay | Admitting: Family Medicine

## 2024-08-05 DIAGNOSIS — J4540 Moderate persistent asthma, uncomplicated: Secondary | ICD-10-CM

## 2024-09-02 ENCOUNTER — Ambulatory Visit
Admission: EM | Admit: 2024-09-02 | Discharge: 2024-09-02 | Disposition: A | Discharge status: Home / Self Care | Attending: Family Medicine | Admitting: Family Medicine

## 2024-09-02 DIAGNOSIS — R509 Fever, unspecified: Secondary | ICD-10-CM | POA: Diagnosis not present

## 2024-09-02 DIAGNOSIS — J111 Influenza due to unidentified influenza virus with other respiratory manifestations: Secondary | ICD-10-CM

## 2024-09-02 DIAGNOSIS — R059 Cough, unspecified: Secondary | ICD-10-CM

## 2024-09-02 LAB — POC SOFIA SARS ANTIGEN FIA: SARS Coronavirus 2 Ag: NEGATIVE

## 2024-09-02 LAB — POCT INFLUENZA A/B
Influenza A, POC: NEGATIVE
Influenza B, POC: NEGATIVE

## 2024-09-02 MED ORDER — ACETAMINOPHEN 325 MG PO TABS
650.0000 mg | ORAL_TABLET | Freq: Once | ORAL | Status: AC
  Administered 2024-09-02: 650 mg via ORAL

## 2024-09-02 MED ORDER — PREDNISONE 20 MG PO TABS: ORAL_TABLET | ORAL | 0 refills | Status: AC

## 2024-09-02 NOTE — ED Triage Notes (Addendum)
 Pt c/o flu like sxs that started this morning upon waking up this am. Tmax 101 earlier today. Mucinex  prn.

## 2024-09-02 NOTE — Discharge Instructions (Addendum)
 Advised patient take medication as directed with food to completion.  Advised patient COVID-19 and influenza were negative today.  Advised may take OTC Tylenol  1000 mg every 6 hours for fever (oral temperature greater than 100.3).  Encouraged increase daily water intake to 64 ounces per day while taking these medications.  Advised if symptoms worsen and/or unresolved please follow-up with PCP or here for further evaluation.

## 2024-09-02 NOTE — ED Provider Notes (Signed)
 " [facility]    CSN: [id number] Arrival date & time: 09/02/24  1507      History   Chief Complaint Chief Complaint  Patient presents with   Flu like sxs    HPI Chase Stout is a 43 y.o. male.   HPI 43 year old male presents with influenza-like symptoms that began earlier this morning.  Reports temperature of 101.0 earlier this morning PMH significant for obesity, asthma, and osteoarthritis of left shoulder.  Past Medical History:  Diagnosis Date   Allergy    Hydrocele 01/31/1999   small hydroceles bilaterally, small calcified loose body in left hemiscrotum   Kidney stones    Osteoarthritis of left shoulder 06/18/2012   Sinus infection     Patient Active Problem List   Diagnosis Date Noted   Dyslipidemia 01/01/2023   S/P ACL reconstruction 02/12/2018   GERD (gastroesophageal reflux disease) 06/14/2015   Asthma 04/29/2015   Morbid obesity (HCC) 04/29/2015   Vasomotor rhinitis 09/01/2014   Insomnia 11/22/2012   Anxiety 11/22/2012   Rotator cuff dysfunction 06/18/2012   Osteoarthritis of left shoulder 06/18/2012    Past Surgical History:  Procedure Laterality Date   EYE SURGERY Bilateral    Lasix   KNEE ARTHROSCOPY Left 02/04/2018   Left ACL   LITHOTRIPSY     NASAL SINUS SURGERY     SHOULDER HEMI-ARTHROPLASTY  09/24/2012   Procedure: SHOULDER HEMI-ARTHROPLASTY;  Surgeon: Fonda SHAUNNA Olmsted, MD;  Location: [facility] OR;  Service: Orthopedics;  Laterality: Left;       Home Medications    Prior to Admission medications  Medication Sig Start Date End Date Taking? Authorizing Provider  ezetimibe  (ZETIA ) 10 MG tablet Take 1 tablet (10 mg total) by mouth daily. 04/21/24   Dettinger, Fonda LABOR, MD  predniSONE  (DELTASONE ) 20 MG tablet Take 3 tabs PO daily x 5 days. 09/02/24  Yes Teddy Sharper, FNP  albuterol  (PROVENTIL ) (2.5 MG/3ML) 0.083% nebulizer solution Take 3 mLs (2.5 mg total) by nebulization every 6 (six) hours as needed for wheezing or shortness of  breath. 02/08/24   Dettinger, Fonda LABOR, MD  albuterol  (VENTOLIN  HFA) 108 (90 Base) MCG/ACT inhaler Inhale 2 puffs into the lungs every 6 (six) hours as needed for wheezing or shortness of breath. 04/14/24   Dettinger, Fonda LABOR, MD  budesonide -formoterol  (SYMBICORT ) 160-4.5 MCG/ACT inhaler INHALE 2 PUFFS INTO THE LUNGS TWICE A DAY 08/05/24   Dettinger, Fonda LABOR, MD  fluticasone  (FLONASE ) 50 MCG/ACT nasal spray Place 2 sprays into both nostrils daily. 07/24/24   Rolan Berthold, PA-C  levocetirizine (XYZAL  ALLERGY 24HR) 5 MG tablet Take 1 tablet (5 mg total) by mouth every evening for 14 days. 07/24/24 08/07/24  Rolan Berthold, PA-C  meloxicam  (MOBIC ) 15 MG tablet TAKE 1 TABLET (15 MG TOTAL) BY MOUTH DAILY. 07/25/24   Dettinger, Fonda LABOR, MD  mirtazapine  (REMERON ) 15 MG tablet Take 1 tablet (15 mg total) by mouth at bedtime. 04/14/24   Dettinger, Fonda LABOR, MD  montelukast  (SINGULAIR ) 10 MG tablet TAKE ONE TABLET EVERY DAY AT BEDTIME 07/14/24   Dettinger, Fonda LABOR, MD  pantoprazole  (PROTONIX ) 40 MG tablet Take 1 tablet (40 mg total) by mouth daily. 04/14/24   Dettinger, Fonda LABOR, MD  TESTOSTERONE  IM     [provider]    Family History Family History  Problem Relation Age of Onset   Hypertension Mother    Cancer Father        lung   Hypertension Brother     Social  History Social History[1]   Allergies   Doxycycline  and Covid-19 mrna vaccine (pfizer) [covid-19 mrna vacc (moderna)]   Review of Systems Review of Systems  Constitutional:  Positive for fever.  All other systems reviewed and are negative.    Physical Exam Triage Vital Signs ED Triage Vitals  Encounter Vitals Group     BP      Girls Systolic BP Percentile      Girls Diastolic BP Percentile      Boys Systolic BP Percentile      Boys Diastolic BP Percentile      Pulse      Resp      Temp      Temp src      SpO2      Weight      Height      Head Circumference      Peak Flow      Pain Score      Pain Loc       Pain Education      Exclude from Growth Chart    No data found.  Updated Vital Signs BP 130/84 (BP Location: Right Arm)   Pulse 98   Temp 99.3 F (37.4 C) (Oral)   Resp 17   SpO2 95%   Visual Acuity Right Eye Distance:   Left Eye Distance:   Bilateral Distance:    Right Eye Near:   Left Eye Near:    Bilateral Near:     Physical Exam Vitals and nursing note reviewed.  Constitutional:      Appearance: Normal appearance. He is obese. He is ill-appearing.  HENT:     Head: Normocephalic and atraumatic.     Right Ear: Tympanic membrane, ear canal and external ear normal.     Left Ear: Tympanic membrane, ear canal and external ear normal.     Mouth/Throat:     Mouth: Mucous membranes are moist.     Pharynx: Oropharynx is clear.  Eyes:     Extraocular Movements: Extraocular movements intact.     Conjunctiva/sclera: Conjunctivae normal.     Pupils: Pupils are equal, round, and reactive to light.  Cardiovascular:     Rate and Rhythm: Normal rate and regular rhythm.     Heart sounds: Normal heart sounds.  Pulmonary:     Effort: Pulmonary effort is normal.     Breath sounds: Normal breath sounds. No wheezing, rhonchi or rales.     Comments: Frequent nonproductive cough on exam Musculoskeletal:        General: Normal range of motion.  Skin:    General: Skin is warm and dry.  Neurological:     General: No focal deficit present.     Mental Status: He is alert and oriented to person, place, and time. Mental status is at baseline.  Psychiatric:        Mood and Affect: Mood normal.        Behavior: Behavior normal.      UC Treatments / Results  Labs (all labs ordered are listed, but only abnormal results are displayed) Labs Reviewed  POCT INFLUENZA A/B  POC SOFIA SARS ANTIGEN FIA    EKG   Radiology No results found.  Procedures Procedures (including critical care time)  Medications Ordered in UC Medications  acetaminophen  (TYLENOL ) tablet 650 mg (650 mg  Oral Given 09/02/24 1535)    Initial Impression / Assessment and Plan / UC Course  I have reviewed the triage vital signs and the  nursing notes.  Pertinent labs & imaging results that were available during my care of the patient were reviewed by me and considered in my medical decision making (see chart for details).     MDM: 1.  Influenza-like illness-COVID-19 and influenza both negative today; 2.  Fever, unspecified-Advised may take OTC Tylenol  1000 mg every 6 hours for fever (oral temperature greater than 100.3). Advised patient take medication as directed with food to completion.  3.  Cough, unspecified type advised patient COVID-19 and influenza were negative today.  Advised may take OTC Tylenol  1000 mg every 6 hours for fever (oral temperature greater than 100.3).  Encouraged increase daily water intake to 64 ounces per day while taking these medications.  Advised if symptoms worsen and/or unresolved please follow-up with PCP or here for further evaluation.  Patient discharged home, hemodynamically stable.  Work note provided to patient prior to discharge today. Final Clinical Impressions(s) / UC Diagnoses   Final diagnoses:  Influenza-like illness  Fever, unspecified  Cough, unspecified type     Discharge Instructions      Advised patient take medication as directed with food to completion.  Advised patient COVID-19 and influenza were negative today.  Advised may take OTC Tylenol  1000 mg every 6 hours for fever (oral temperature greater than 100.3).  Encouraged increase daily water intake to 64 ounces per day while taking these medications.  Advised if symptoms worsen and/or unresolved please follow-up with PCP or here for further evaluation.     ED Prescriptions     Medication Sig Dispense Auth. Provider   predniSONE  (DELTASONE ) 20 MG tablet Take 3 tabs PO daily x 5 days. 15 tablet Ab Leaming, FNP      PDMP not reviewed this encounter.     [1]  Social  History Tobacco Use   Smoking status: Never   Smokeless tobacco: Never  Vaping Use   Vaping status: Never Used  Substance Use Topics   Alcohol use: Yes    Alcohol/week: 2.0 standard drinks of alcohol    Types: 2 Cans of beer per week    Comment: occasionally   Drug use: No     Teddy Sharper, FNP 09/02/24 1603  "

## 2024-09-07 ENCOUNTER — Telehealth

## 2024-09-07 DIAGNOSIS — B9689 Other specified bacterial agents as the cause of diseases classified elsewhere: Secondary | ICD-10-CM | POA: Diagnosis not present

## 2024-09-07 DIAGNOSIS — J208 Acute bronchitis due to other specified organisms: Secondary | ICD-10-CM

## 2024-09-08 MED ORDER — AZITHROMYCIN 250 MG PO TABS: ORAL_TABLET | ORAL | 0 refills | Status: AC

## 2024-09-08 MED ORDER — BENZONATATE 100 MG PO CAPS: 100.0000 mg | ORAL_CAPSULE | Freq: Three times a day (TID) | ORAL | 0 refills | Status: AC | PRN

## 2024-09-08 NOTE — Progress Notes (Signed)
 We are sorry that you are not feeling well.  Here is how we plan to help!  Based on your presentation I believe you most likely have A cough due to bacteria.  When patients have a fever and a productive cough with a change in color or increased sputum production, we are concerned about bacterial bronchitis.  If left untreated it can progress to pneumonia.  If your symptoms do not improve with your treatment plan it is important that you contact your provider.   I have prescribed Azithromyin 250 mg: two tablets now and then one tablet daily for 4 additonal days    In addition you may use A prescription cough medication called Tessalon  Perles 100mg . You may take 1-2 capsules every 8 hours as needed for your cough.  From your responses in the eVisit questionnaire you describe inflammation in the upper respiratory tract which is causing a significant cough.  This is commonly called Bronchitis and has four common causes:   Allergies Viral Infections Acid Reflux Bacterial Infection Allergies, viruses and acid reflux are treated by controlling symptoms or eliminating the cause. An example might be a cough caused by taking certain blood pressure medications. You stop the cough by changing the medication. Another example might be a cough caused by acid reflux. Controlling the reflux helps control the cough.  USE OF BRONCHODILATOR (RESCUE) INHALERS: There is a risk from using your bronchodilator too frequently.  The risk is that over-reliance on a medication which only relaxes the muscles surrounding the breathing tubes can reduce the effectiveness of medications prescribed to reduce swelling and congestion of the tubes themselves.  Although you feel brief relief from the bronchodilator inhaler, your asthma may actually be worsening with the tubes becoming more swollen and filled with mucus.  This can delay other crucial treatments, such as oral steroid medications. If you need to use a bronchodilator inhaler  daily, several times per day, you should discuss this with your provider.  There are probably better treatments that could be used to keep your asthma under control.     HOME CARE Only take medications as instructed by your medical team. Complete the entire course of an antibiotic. Drink plenty of fluids and get plenty of rest. Avoid close contacts especially the very young and the elderly Cover your mouth if you cough or cough into your sleeve. Always remember to wash your hands A steam or ultrasonic humidifier can help congestion.   GET HELP RIGHT AWAY IF: You develop worsening fever. You become short of breath You cough up blood. Your symptoms persist after you have completed your treatment plan MAKE SURE YOU  Understand these instructions. Will watch your condition. Will get help right away if you are not doing well or get worse.  Your e-visit answers were reviewed by a board certified advanced clinical practitioner to complete your personal care plan.  Depending on the condition, your plan could have included both over the counter or prescription medications. If there is a problem please reply  once you have received a response from your provider. Your safety is important to us .  If you have drug allergies check your prescription carefully.    You can use MyChart to ask questions about today's visit, request a non-urgent call back, or ask for a work or school excuse for 24 hours related to this e-Visit. If it has been greater than 24 hours you will need to follow up with your provider, or enter a new e-Visit to  address those concerns. You will get an e-mail in the next two days asking about your experience.  I hope that your e-visit has been valuable and will speed your recovery. Thank you for using e-visits.   I have spent 5 minutes in review of e-visit questionnaire, review and updating patient chart, medical decision making and response to patient.   Elsie Velma Lunger,  PA-C

## 2024-09-28 ENCOUNTER — Other Ambulatory Visit: Payer: Self-pay | Admitting: Family Medicine
